# Patient Record
Sex: Female | Born: 1970
Health system: Southern US, Community
[De-identification: ages and names within clinical notes are randomized; demographics above are authoritative.]

## PROBLEM LIST (undated history)

## (undated) DIAGNOSIS — R21 Rash and other nonspecific skin eruption: Secondary | ICD-10-CM

## (undated) DIAGNOSIS — I10 Essential (primary) hypertension: Secondary | ICD-10-CM

## (undated) DIAGNOSIS — R7303 Prediabetes: Secondary | ICD-10-CM

## (undated) DIAGNOSIS — Z20828 Contact with and (suspected) exposure to other viral communicable diseases: Secondary | ICD-10-CM

## (undated) DIAGNOSIS — F419 Anxiety disorder, unspecified: Secondary | ICD-10-CM

## (undated) DIAGNOSIS — E559 Vitamin D deficiency, unspecified: Secondary | ICD-10-CM

## (undated) DIAGNOSIS — E785 Hyperlipidemia, unspecified: Secondary | ICD-10-CM

## (undated) DIAGNOSIS — E538 Deficiency of other specified B group vitamins: Secondary | ICD-10-CM

## (undated) DIAGNOSIS — R011 Cardiac murmur, unspecified: Secondary | ICD-10-CM

## (undated) DIAGNOSIS — I493 Ventricular premature depolarization: Secondary | ICD-10-CM

## (undated) DIAGNOSIS — D509 Iron deficiency anemia, unspecified: Secondary | ICD-10-CM

## (undated) HISTORY — PX: WISDOM TOOTH EXTRACTION: SHX21

## (undated) HISTORY — DX: Ventricular premature depolarization: I49.3

## (undated) HISTORY — DX: Anxiety disorder, unspecified: F41.9

## (undated) HISTORY — DX: Gilbert syndrome: E80.4

## (undated) HISTORY — PX: TUBAL LIGATION: SHX77

## (undated) HISTORY — PX: TOTAL ABDOMINAL HYSTERECTOMY: SHX209

## (undated) HISTORY — DX: Contact with and (suspected) exposure to other viral communicable diseases: Z20.828

## (undated) HISTORY — DX: Hyperlipidemia, unspecified: E78.5

---

## 1998-08-02 ENCOUNTER — Emergency Department (HOSPITAL_COMMUNITY): Admission: EM | Admit: 1998-08-02 | Discharge: 1998-08-02 | Payer: Self-pay | Admitting: Emergency Medicine

## 1999-01-17 ENCOUNTER — Other Ambulatory Visit: Admission: RE | Admit: 1999-01-17 | Discharge: 1999-01-17 | Payer: Self-pay | Admitting: Obstetrics and Gynecology

## 1999-02-25 ENCOUNTER — Ambulatory Visit (HOSPITAL_COMMUNITY): Admission: RE | Admit: 1999-02-25 | Discharge: 1999-02-25 | Payer: Self-pay | Admitting: Obstetrics and Gynecology

## 1999-02-25 ENCOUNTER — Encounter: Payer: Self-pay | Admitting: Obstetrics and Gynecology

## 1999-04-08 ENCOUNTER — Inpatient Hospital Stay (HOSPITAL_COMMUNITY): Admission: AD | Admit: 1999-04-08 | Discharge: 1999-04-15 | Payer: Self-pay | Admitting: Obstetrics and Gynecology

## 1999-04-16 ENCOUNTER — Encounter (HOSPITAL_COMMUNITY): Admission: RE | Admit: 1999-04-16 | Discharge: 1999-07-15 | Payer: Self-pay | Admitting: Obstetrics and Gynecology

## 2000-02-05 ENCOUNTER — Other Ambulatory Visit: Admission: RE | Admit: 2000-02-05 | Discharge: 2000-02-05 | Payer: Self-pay | Admitting: Obstetrics and Gynecology

## 2001-03-09 ENCOUNTER — Other Ambulatory Visit: Admission: RE | Admit: 2001-03-09 | Discharge: 2001-03-09 | Payer: Self-pay | Admitting: Obstetrics and Gynecology

## 2002-04-06 ENCOUNTER — Other Ambulatory Visit: Admission: RE | Admit: 2002-04-06 | Discharge: 2002-04-06 | Payer: Self-pay | Admitting: Obstetrics and Gynecology

## 2003-05-03 ENCOUNTER — Encounter: Payer: Self-pay | Admitting: Emergency Medicine

## 2003-05-03 ENCOUNTER — Emergency Department (HOSPITAL_COMMUNITY): Admission: EM | Admit: 2003-05-03 | Discharge: 2003-05-03 | Payer: Self-pay | Admitting: Emergency Medicine

## 2003-05-04 ENCOUNTER — Encounter: Admission: RE | Admit: 2003-05-04 | Discharge: 2003-05-04 | Payer: Self-pay | Admitting: Internal Medicine

## 2003-05-04 ENCOUNTER — Encounter: Payer: Self-pay | Admitting: Internal Medicine

## 2003-05-08 ENCOUNTER — Inpatient Hospital Stay (HOSPITAL_COMMUNITY): Admission: EM | Admit: 2003-05-08 | Discharge: 2003-05-10 | Payer: Self-pay | Admitting: Emergency Medicine

## 2003-05-09 ENCOUNTER — Encounter: Payer: Self-pay | Admitting: Cardiology

## 2004-02-19 ENCOUNTER — Other Ambulatory Visit: Admission: RE | Admit: 2004-02-19 | Discharge: 2004-02-19 | Payer: Self-pay | Admitting: Obstetrics and Gynecology

## 2004-03-21 ENCOUNTER — Ambulatory Visit (HOSPITAL_COMMUNITY): Admission: RE | Admit: 2004-03-21 | Discharge: 2004-03-21 | Payer: Self-pay | Admitting: Obstetrics and Gynecology

## 2004-07-04 ENCOUNTER — Inpatient Hospital Stay (HOSPITAL_COMMUNITY): Admission: AD | Admit: 2004-07-04 | Discharge: 2004-07-04 | Payer: Self-pay | Admitting: Obstetrics and Gynecology

## 2004-07-05 ENCOUNTER — Inpatient Hospital Stay (HOSPITAL_COMMUNITY): Admission: AD | Admit: 2004-07-05 | Discharge: 2004-07-05 | Payer: Self-pay | Admitting: Obstetrics and Gynecology

## 2004-08-26 ENCOUNTER — Inpatient Hospital Stay (HOSPITAL_COMMUNITY): Admission: AD | Admit: 2004-08-26 | Discharge: 2004-08-26 | Payer: Self-pay | Admitting: Obstetrics and Gynecology

## 2004-08-29 ENCOUNTER — Encounter (INDEPENDENT_AMBULATORY_CARE_PROVIDER_SITE_OTHER): Payer: Self-pay | Admitting: Specialist

## 2004-08-29 ENCOUNTER — Inpatient Hospital Stay (HOSPITAL_COMMUNITY): Admission: AD | Admit: 2004-08-29 | Discharge: 2004-09-02 | Payer: Self-pay | Admitting: Obstetrics and Gynecology

## 2004-09-03 ENCOUNTER — Encounter: Admission: RE | Admit: 2004-09-03 | Discharge: 2004-10-03 | Payer: Self-pay | Admitting: Obstetrics and Gynecology

## 2004-11-03 ENCOUNTER — Encounter: Admission: RE | Admit: 2004-11-03 | Discharge: 2004-12-03 | Payer: Self-pay | Admitting: Obstetrics and Gynecology

## 2006-04-23 ENCOUNTER — Ambulatory Visit: Payer: Self-pay | Admitting: Internal Medicine

## 2006-04-26 ENCOUNTER — Ambulatory Visit: Payer: Self-pay | Admitting: Internal Medicine

## 2006-05-26 ENCOUNTER — Ambulatory Visit: Payer: Self-pay | Admitting: Internal Medicine

## 2006-10-04 ENCOUNTER — Emergency Department (HOSPITAL_COMMUNITY): Admission: EM | Admit: 2006-10-04 | Discharge: 2006-10-04 | Payer: Self-pay | Admitting: Emergency Medicine

## 2006-11-05 ENCOUNTER — Emergency Department (HOSPITAL_COMMUNITY): Admission: EM | Admit: 2006-11-05 | Discharge: 2006-11-05 | Payer: Self-pay | Admitting: Emergency Medicine

## 2007-06-22 ENCOUNTER — Ambulatory Visit: Payer: Self-pay | Admitting: Internal Medicine

## 2007-06-22 DIAGNOSIS — I493 Ventricular premature depolarization: Secondary | ICD-10-CM | POA: Insufficient documentation

## 2007-06-22 DIAGNOSIS — F411 Generalized anxiety disorder: Secondary | ICD-10-CM | POA: Insufficient documentation

## 2007-06-29 ENCOUNTER — Telehealth (INDEPENDENT_AMBULATORY_CARE_PROVIDER_SITE_OTHER): Payer: Self-pay | Admitting: *Deleted

## 2008-01-25 ENCOUNTER — Ambulatory Visit: Payer: Self-pay | Admitting: Internal Medicine

## 2008-01-25 DIAGNOSIS — R002 Palpitations: Secondary | ICD-10-CM | POA: Insufficient documentation

## 2008-05-25 ENCOUNTER — Ambulatory Visit: Payer: Self-pay | Admitting: Internal Medicine

## 2008-06-15 ENCOUNTER — Ambulatory Visit: Payer: Self-pay | Admitting: Licensed Clinical Social Worker

## 2008-06-20 ENCOUNTER — Ambulatory Visit: Payer: Self-pay | Admitting: Licensed Clinical Social Worker

## 2008-07-02 ENCOUNTER — Ambulatory Visit: Payer: Self-pay | Admitting: Licensed Clinical Social Worker

## 2008-07-09 ENCOUNTER — Ambulatory Visit: Payer: Self-pay | Admitting: Licensed Clinical Social Worker

## 2008-07-13 ENCOUNTER — Ambulatory Visit: Payer: Self-pay | Admitting: Internal Medicine

## 2008-07-13 DIAGNOSIS — I10 Essential (primary) hypertension: Secondary | ICD-10-CM | POA: Insufficient documentation

## 2008-07-16 ENCOUNTER — Ambulatory Visit: Payer: Self-pay | Admitting: Licensed Clinical Social Worker

## 2008-07-17 ENCOUNTER — Telehealth (INDEPENDENT_AMBULATORY_CARE_PROVIDER_SITE_OTHER): Payer: Self-pay | Admitting: *Deleted

## 2008-07-23 ENCOUNTER — Telehealth (INDEPENDENT_AMBULATORY_CARE_PROVIDER_SITE_OTHER): Payer: Self-pay | Admitting: *Deleted

## 2008-08-01 ENCOUNTER — Ambulatory Visit: Payer: Self-pay | Admitting: Licensed Clinical Social Worker

## 2008-09-28 ENCOUNTER — Telehealth (INDEPENDENT_AMBULATORY_CARE_PROVIDER_SITE_OTHER): Payer: Self-pay | Admitting: *Deleted

## 2008-11-14 ENCOUNTER — Ambulatory Visit: Payer: Self-pay | Admitting: Internal Medicine

## 2008-11-14 DIAGNOSIS — E785 Hyperlipidemia, unspecified: Secondary | ICD-10-CM | POA: Insufficient documentation

## 2008-11-23 ENCOUNTER — Encounter (INDEPENDENT_AMBULATORY_CARE_PROVIDER_SITE_OTHER): Payer: Self-pay | Admitting: *Deleted

## 2009-03-21 ENCOUNTER — Telehealth (INDEPENDENT_AMBULATORY_CARE_PROVIDER_SITE_OTHER): Payer: Self-pay | Admitting: *Deleted

## 2009-12-19 ENCOUNTER — Ambulatory Visit: Payer: Self-pay | Admitting: Internal Medicine

## 2009-12-19 DIAGNOSIS — H019 Unspecified inflammation of eyelid: Secondary | ICD-10-CM | POA: Insufficient documentation

## 2009-12-23 ENCOUNTER — Encounter (INDEPENDENT_AMBULATORY_CARE_PROVIDER_SITE_OTHER): Payer: Self-pay | Admitting: *Deleted

## 2010-03-06 ENCOUNTER — Emergency Department (HOSPITAL_COMMUNITY): Admission: EM | Admit: 2010-03-06 | Discharge: 2010-03-06 | Payer: Self-pay | Admitting: Emergency Medicine

## 2011-01-18 LAB — CONVERTED CEMR LAB
ALT: 17 units/L (ref 0–35)
ALT: 30 units/L (ref 0–35)
AST: 23 units/L (ref 0–37)
AST: 25 units/L (ref 0–37)
Albumin: 3.9 g/dL (ref 3.5–5.2)
Albumin: 4.2 g/dL (ref 3.5–5.2)
Alkaline Phosphatase: 52 units/L (ref 39–117)
Alkaline Phosphatase: 66 units/L (ref 39–117)
BUN: 10 mg/dL (ref 6–23)
BUN: 7 mg/dL (ref 6–23)
BUN: 8 mg/dL (ref 6–23)
Basophils Absolute: 0 10*3/uL (ref 0.0–0.1)
Basophils Absolute: 0 10*3/uL (ref 0.0–0.1)
Basophils Absolute: 0 10*3/uL (ref 0.0–0.1)
Basophils Absolute: 0 10*3/uL (ref 0.0–0.1)
Basophils Relative: 0 % (ref 0.0–1.0)
Basophils Relative: 0.2 % (ref 0.0–1.0)
Basophils Relative: 0.5 % (ref 0.0–3.0)
Basophils Relative: 0.8 % (ref 0.0–3.0)
Bilirubin Urine: NEGATIVE
Bilirubin, Direct: 0.1 mg/dL (ref 0.0–0.3)
Bilirubin, Direct: 0.2 mg/dL (ref 0.0–0.3)
Blood in Urine, dipstick: NEGATIVE
CO2: 27 meq/L (ref 19–32)
CO2: 28 meq/L (ref 19–32)
CO2: 29 meq/L (ref 19–32)
Calcium: 9 mg/dL (ref 8.4–10.5)
Calcium: 9.1 mg/dL (ref 8.4–10.5)
Calcium: 9.1 mg/dL (ref 8.4–10.5)
Chloride: 104 meq/L (ref 96–112)
Chloride: 105 meq/L (ref 96–112)
Chloride: 106 meq/L (ref 96–112)
Cholesterol, target level: 200 mg/dL
Cholesterol: 230 mg/dL (ref 0–200)
Cholesterol: 236 mg/dL (ref 0–200)
Cholesterol: 246 mg/dL — ABNORMAL HIGH (ref 0–200)
Creatinine, Ser: 0.7 mg/dL (ref 0.4–1.2)
Creatinine, Ser: 0.7 mg/dL (ref 0.4–1.2)
Creatinine, Ser: 0.8 mg/dL (ref 0.4–1.2)
Creatinine, Ser: 0.8 mg/dL (ref 0.4–1.2)
Direct LDL: 113.9 mg/dL
Direct LDL: 150.2 mg/dL
Direct LDL: 152.2 mg/dL
Eosinophils Absolute: 0.1 10*3/uL (ref 0.0–0.6)
Eosinophils Absolute: 0.1 10*3/uL (ref 0.0–0.7)
Eosinophils Absolute: 0.1 10*3/uL (ref 0.0–0.7)
Eosinophils Absolute: 0.2 10*3/uL (ref 0.0–0.6)
Eosinophils Relative: 1.1 % (ref 0.0–5.0)
Eosinophils Relative: 1.8 % (ref 0.0–5.0)
Eosinophils Relative: 2.2 % (ref 0.0–5.0)
Eosinophils Relative: 6.8 % — ABNORMAL HIGH (ref 0.0–5.0)
Free T4: 0.7 ng/dL (ref 0.6–1.6)
GFR calc Af Amer: 104 mL/min
GFR calc Af Amer: 122 mL/min
GFR calc non Af Amer: 101 mL/min
GFR calc non Af Amer: 103.19 mL/min (ref >60)
GFR calc non Af Amer: 86 mL/min
Glucose, Bld: 74 mg/dL (ref 70–99)
Glucose, Bld: 77 mg/dL (ref 70–99)
Glucose, Bld: 86 mg/dL (ref 70–99)
Glucose, Urine, Semiquant: NEGATIVE
HCT: 34 % — ABNORMAL LOW (ref 36.0–46.0)
HCT: 38.9 % (ref 36.0–46.0)
HCT: 40.9 % (ref 36.0–46.0)
HCT: 41 % (ref 36.0–46.0)
HDL goal, serum: 40 mg/dL
HDL: 75.2 mg/dL (ref >39.0)
HDL: 80 mg/dL (ref >39.00)
HDL: 91.8 mg/dL (ref >39.0)
Hemoglobin: 11 g/dL
Hemoglobin: 11.2 g/dL — ABNORMAL LOW (ref 12.0–15.0)
Hemoglobin: 13 g/dL (ref 12.0–15.0)
Hemoglobin: 13.6 g/dL (ref 12.0–15.0)
Hemoglobin: 13.9 g/dL (ref 12.0–15.0)
Iron: 74 ug/dL (ref 42–145)
Ketones, urine, test strip: NEGATIVE
LDL Goal: 160 mg/dL
Lymphocytes Relative: 25.8 % (ref 12.0–46.0)
Lymphocytes Relative: 28.8 % (ref 12.0–46.0)
Lymphocytes Relative: 28.9 % (ref 12.0–46.0)
Lymphocytes Relative: 47.4 % — ABNORMAL HIGH (ref 12.0–46.0)
Lymphs Abs: 1.4 10*3/uL (ref 0.7–4.0)
MCHC: 32.8 g/dL (ref 30.0–36.0)
MCHC: 33.3 g/dL (ref 30.0–36.0)
MCHC: 33.4 g/dL (ref 30.0–36.0)
MCHC: 33.8 g/dL (ref 30.0–36.0)
MCV: 85.6 fL (ref 78.0–100.0)
MCV: 87.9 fL (ref 78.0–100.0)
MCV: 88 fL (ref 78.0–100.0)
MCV: 90.2 fL (ref 78.0–100.0)
Monocytes Absolute: 0.3 10*3/uL (ref 0.2–0.7)
Monocytes Absolute: 0.4 10*3/uL (ref 0.1–1.0)
Monocytes Absolute: 0.4 10*3/uL (ref 0.1–1.0)
Monocytes Absolute: 0.5 10*3/uL (ref 0.2–0.7)
Monocytes Relative: 16.9 % — ABNORMAL HIGH (ref 3.0–11.0)
Monocytes Relative: 6.3 % (ref 3.0–11.0)
Monocytes Relative: 6.9 % (ref 3.0–12.0)
Monocytes Relative: 7.9 % (ref 3.0–12.0)
Neutro Abs: 0.9 10*3/uL — ABNORMAL LOW (ref 1.4–7.7)
Neutro Abs: 3.1 10*3/uL (ref 1.4–7.7)
Neutro Abs: 3.7 10*3/uL (ref 1.4–7.7)
Neutro Abs: 3.7 10*3/uL (ref 1.4–7.7)
Neutrophils Relative %: 28.9 % — ABNORMAL LOW (ref 43.0–77.0)
Neutrophils Relative %: 61.2 % (ref 43.0–77.0)
Neutrophils Relative %: 63.6 % (ref 43.0–77.0)
Neutrophils Relative %: 64 % (ref 43.0–77.0)
Nitrite: NEGATIVE
Platelets: 232 10*3/uL (ref 150.0–400.0)
Platelets: 237 10*3/uL (ref 150–400)
Platelets: 244 10*3/uL (ref 150–400)
Platelets: 306 10*3/uL (ref 150–400)
Potassium: 4 meq/L (ref 3.5–5.1)
Potassium: 4 meq/L (ref 3.5–5.1)
Potassium: 4.4 meq/L (ref 3.5–5.1)
Potassium: 4.4 meq/L (ref 3.5–5.1)
Protein, U semiquant: NEGATIVE
RBC: 3.86 M/uL — ABNORMAL LOW (ref 3.87–5.11)
RBC: 4.31 M/uL (ref 3.87–5.11)
RBC: 4.66 M/uL (ref 3.87–5.11)
RBC: 4.78 M/uL (ref 3.87–5.11)
RDW: 13.2 % (ref 11.5–14.6)
RDW: 13.2 % (ref 11.5–14.6)
RDW: 13.4 % (ref 11.5–14.6)
RDW: 13.5 % (ref 11.5–14.6)
Saturation Ratios: 20.4 % (ref 20.0–50.0)
Sodium: 139 meq/L (ref 135–145)
Sodium: 140 meq/L (ref 135–145)
Sodium: 141 meq/L (ref 135–145)
Specific Gravity, Urine: 1.01
TSH: 1.09 microintl units/mL (ref 0.35–5.50)
TSH: 1.17 microintl units/mL (ref 0.35–5.50)
TSH: 1.31 microintl units/mL (ref 0.35–5.50)
TSH: 1.41 microintl units/mL (ref 0.35–5.50)
Total Bilirubin: 1.3 mg/dL — ABNORMAL HIGH (ref 0.3–1.2)
Total Bilirubin: 1.6 mg/dL — ABNORMAL HIGH (ref 0.3–1.2)
Total CHOL/HDL Ratio: 2.5
Total CHOL/HDL Ratio: 3
Total CHOL/HDL Ratio: 3.1
Total Protein: 7.2 g/dL (ref 6.0–8.3)
Total Protein: 7.8 g/dL (ref 6.0–8.3)
Transferrin: 259.2 mg/dL (ref >=212.0)
Triglycerides: 43 mg/dL (ref 0–149)
Triglycerides: 51 mg/dL (ref 0–149)
Triglycerides: 58 mg/dL (ref 0.0–149.0)
Urobilinogen, UA: NEGATIVE
VLDL: 10 mg/dL (ref 0–40)
VLDL: 11.6 mg/dL (ref 0.0–40.0)
VLDL: 9 mg/dL (ref 0–40)
WBC Urine, dipstick: NEGATIVE
WBC: 3.1 10*3/uL — ABNORMAL LOW (ref 4.5–10.5)
WBC: 4.9 10*3/uL (ref 4.5–10.5)
WBC: 5.6 10*3/uL (ref 4.5–10.5)
WBC: 5.9 10*3/uL (ref 4.5–10.5)
pH: 6.5

## 2011-01-22 NOTE — Consult Note (Signed)
Summary: [name]   Imported By: Lanelle Bal 12/25/2009 11:52:00  _____________________________________________________________________  External Attachment:    Type:   Image     Comment:   External Document

## 2011-01-22 NOTE — Letter (Signed)
Summary: Results Follow up Letter   at [place]  [address] [place], [place] [address]   Phone: [phone]  Fax: [phone]    12/23/2009 MRN: [id number]  [facility] [address] [place], [place]  [address]  Dear [name],  The following are the results of your recent test(s):  Test         Result    Pap Smear:        Normal _____  Not Normal _____ Comments: ______________________________________________________ Cholesterol: LDL(Bad cholesterol):         Your goal is less than:         HDL (Good cholesterol):       Your goal is more than: Comments:  ______________________________________________________ Mammogram:        Normal _____  Not Normal _____ Comments:  ___________________________________________________________________ Hemoccult:        Normal _____  Not normal _______ Comments:    _____________________________________________________________________ Other Tests: Please see attached labs done on 12/19/2009, call to schedule appointment to recheck labs in 4 months [phone] EXT(0)    We routinely do not discuss normal results over the telephone.  If you desire a copy of the results, or you have any questions about this information we can discuss them at your next office visit.   Sincerely,

## 2011-02-09 ENCOUNTER — Telehealth (INDEPENDENT_AMBULATORY_CARE_PROVIDER_SITE_OTHER): Payer: Self-pay | Admitting: *Deleted

## 2011-02-17 NOTE — Progress Notes (Signed)
Summary: refill  Phone Note Refill Request Message from:  Fax from Pharmacy  Refills Requested: Medication #1:  FLUOXETINE HCL 20 MG  TABS once daily as directed Gweneth Dimitri - fax [phone]  Initial call taken by: Okey Regal [place],  February 09, 2011 3:38 PM    Prescriptions: FLUOXETINE HCL 20 MG  TABS (FLUOXETINE HCL) once daily as directed  #90 x 0   Entered by:   Shonna Chock CMA   Authorized by:   Marga Melnick MD   Signed by:   Shonna Chock CMA on 02/09/2011   Method used:   Electronically to        [facility] (retail)       [address]       [place], [place]  [address]       Ph: [phone]       Fax: [phone]   RxID:   [id number]

## 2011-03-04 ENCOUNTER — Encounter: Payer: Self-pay | Admitting: Internal Medicine

## 2011-03-16 LAB — POCT I-STAT, CHEM 8
BUN: 7 mg/dL (ref 6–23)
Creatinine, Ser: 0.8 mg/dL (ref 0.4–1.2)
Glucose, Bld: 87 mg/dL (ref 70–99)
Hemoglobin: 12.9 g/dL (ref 12.0–15.0)
Potassium: 4.1 mEq/L (ref 3.5–5.1)

## 2011-03-16 LAB — URINALYSIS, ROUTINE W REFLEX MICROSCOPIC
Bilirubin Urine: NEGATIVE
Glucose, UA: NEGATIVE mg/dL
Hgb urine dipstick: NEGATIVE
Urobilinogen, UA: 0.2 mg/dL (ref 0.0–1.0)
pH: 7.5 (ref 5.0–8.0)

## 2011-04-07 ENCOUNTER — Other Ambulatory Visit: Payer: Self-pay

## 2011-04-07 MED ORDER — FLUOXETINE HCL 10 MG PO TABS: 10.0000 mg | ORAL_TABLET | ORAL | Status: DC
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
## 2011-05-08 NOTE — Op Note (Signed)
[name], [name]                           ACCOUNT NO.:  [id number]   MEDICAL RECORD NO.:  [id number]                   PATIENT TYPE:  AMB   LOCATION:  SDC                                  [facility]  [facility]   PHYSICIAN:  Maxie Better, M.D.            DATE OF BIRTH:  1971/02/01   DATE OF PROCEDURE:  03/21/2004  DATE OF DISCHARGE:                                 OPERATIVE REPORT   PREOPERATIVE DIAGNOSES:  1. Cervical incompetence.  2. Intrauterine gestation at 13+ weeks.   PROCEDURE:  McDonald cerclage placement.   POSTOPERATIVE DIAGNOSES:  1. Cervical incompetence.  2. Intrauterine gestation at 13+ weeks.   ANESTHESIA:  Spinal.   SURGEON:  Maxie Better, M.D.   INDICATIONS:  This is a 40 year old gravida 2, para 0-1-0-1, female at 13+  weeks' gestation with a previous preterm delivery at 23 weeks, who is now  admitted for cerclage placement.  Her dating has been confirmed by  ultrasound.  Risk and benefit of the procedure has been explained to the  patient and her husband, and consent was signed.  The patient was  transferred to the operating room.   PROCEDURE:  Under adequate spinal anesthesia, the patient was placed in the  dorsal lithotomy position.  She was sterilely prepped and draped in the  usual fashion, and the bladder was catheterized for a small amount of urine.  Examination under anesthesia revealed anteverted 13-14 week size uterus.  Cervix was about 3 cm long, fingertip external os.  A weighted speculum was  placed in the vagina and a Sims retractor was used anteriorly.  The vagina  was prepped with sterile water and a #1 Prolene was utilized to perform a  McDonald cerclage placement in the usual fashion.  The suture started at 12  o'clock.  The procedure was done without incident, at which time the cervix  still remained long and otherwise essentially closed.  All instruments were  then removed from the vagina.   SPECIMENS:  None.   ESTIMATED  BLOOD LOSS:  Minimal.   COMPLICATIONS:  None.   The patient tolerated the procedure well, was transferred to the recovery  room in stable condition.                                               Maxie Better, M.D.   [facility]  D:  03/21/2004  T:  03/21/2004  Job:  [id number]

## 2011-05-08 NOTE — Op Note (Signed)
[name], [name]                           ACCOUNT NO.:  [id number]   MEDICAL RECORD NO.:  [id number]                   PATIENT TYPE:  INP   LOCATION:  NA                                   [facility]   PHYSICIAN:  Maxie Better, M.D.            DATE OF BIRTH:  February 19, 1971   DATE OF PROCEDURE:  08/29/2004  DATE OF DISCHARGE:                                 OPERATIVE REPORT   PREOPERATIVE DIAGNOSES:  1.  Antepartum hemorrhage.  2.  Placental abruption.  3.  Previous cesarean section.  4.  Intrauterine gestation at 36 plus weeks.  5.  Desires sterilization.   POSTOPERATIVE DIAGNOSES:  1.  Antepartum hemorrhage.  2.  Placental abruption.  3.  Previous cesarean section.  4.  Intrauterine gestation at 36 plus weeks.  5.  Desires sterilization.   PROCEDURE:  Emergency repeat cesarean section via Sharl Ma hysterotomy, modified  Pomeroy tubal ligation.   ANESTHESIA:  Spinal.   SURGEON:  Maxie Better, M.D.   ASSISTANT:  Gerri Spore B. Earlene Plater, M.D.   INDICATIONS FOR PROCEDURE:  A 40 year old, gravida 2, para 0-1-0-1, married  black female with a previous cesarean section secondary to preterm premature  rupture of membranes, placental abruption of 23 weeks who presented at 36  weeks at our office and subsequently had perfuse uncontrollable bright red  vessel bleeding per vagina transferred via ambulance from the office  directly into the operating room for an emergency repeat cesarean section.  The patient and husband confirmed they still desire for sterilization. The  patient had been scheduled for repeat cesarean section and tubal ligation on  September 28.  Her pregnancy had been complicated by cerclage placement at  13-14 weeks due to previous cervical incompetence. The cerclage had been  removed three days ago at maternity admissions.  She received alpha-  hydroxyprogesterone injections until 34 weeks. She had betamethasone for  fetal lung maturity at 28 weeks in  preparation for possibly preterm  delivery.  Her group B strep culture was positive. She had intact membranes  and had noted some decreased fetal movement just prior to presenting to the  office with complaints of vaginal bleeding.   DESCRIPTION OF PROCEDURE:  Under adequate spinal anesthesia, the patient was  quickly sterilely prepped and draped in the usual fashion, indwelling Foley  catheter was sterilely placed.  Marcaine 0.25% was injected along the  previous scar, Pfannenstiel skin incision was made, carried down to the  rectus fascia, rectus fascia was incised transversely. The rectus fascia was  then sharply dissected off the rectus muscle. The rectus muscle had some  diastasis and was split in the midline. Care was taken not to injure the  bladder which appeared to be slightly tucked anteriorly.  The peritoneal  cavity was opened, no evidence of a Couvelaire uterus. The lower uterine  segment was well developed, bladder flap was created and the bladder  displaced inferiorly.  A curvilinear low transverse incision was then made  and extended bluntly with subsequent large amount of clotted bright red  blood removed. The amniotic sac was then seen and artifical rupture of  membranes was performed. Clear fluid was noted. Subsequent delivery of a  live female infant was then accomplished, baby was bulb suctioned in the  abdomen.  The cord was clamped, cut, the baby was transferred to the waiting  pediatricians who assigned Apgar's of 4 and 9 at 1 and 5 minutes.  Cord pH  was obtained which was 7.21 subsequently, weight of the baby was 5 pounds 13  ounces. The placenta was manually removed and sent to pathology. The uterine  cavity was cleaned of debris. No uterine extension was noted.  On the inside  aspect of the lower portion of the opened uterine cavity was a serpentine  type bleeding vessel. This was individually hemostased with a #0 Monocryl.  The uterine incision was then closed  with #0 Monocryl x2, first layer  running locked stitch, second layer was imbricated with good hemostasis  noted. Attention was then turned to the tubes. Both tubes and ovaries are  normal. The lid portion of the left fallopian tube was grasped and placed up  in the field. The underlying mesosalpinx was then opened, proximal and  distal portion of that tube was then tied with #0 plain suture proximally x2  and distally x2 with the intervening segment of tube then removed and the  stump cauterized. The same procedure was performed contralaterally with a  portion of that tube removed. Reinspection of the uterine incision showed  good hemostasis, small bleeding along the peritoneal edges were cauterized.  The abdomen was then copiously irrigated, suctioned of debris with good  hemostasis then noted.  The parietoperitoneums were not closed, the rectus  fascia was closed with #0 Vicryl x2.  A portion of the keloid scar was  removed, however, for adequate closure all of it was not removed.  The  subcutaneous area was irrigated, suctioned, small bleeders cauterized and  skin approximated using Ethicon staples. The specimen was placenta and  portion of right and left fallopian tube all sent to pathology.  Estimated  blood loss was 900 mL intraoperatively.  Intraoperative fluid was 3700 mL  crystalloid and urine output was 200 mL clear yellow urine. Complications  none.  Sponge and instrument counts x2 was correct. The patient tolerated  the procedure well and was transferred to the recovery room in stable  condition.                                               Maxie Better, M.D.    [facility]  D:  08/29/2004  T:  08/30/2004  Job:  [id number]
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
## 2011-05-08 NOTE — Discharge Summary (Signed)
[name], [name]                 ACCOUNT NO.:  [id number]   MEDICAL RECORD NO.:  [id number]          PATIENT TYPE:  INP   LOCATION:  9110                          [facility]  [facility]   PHYSICIAN:  Maxie Better, M.D.DATE OF BIRTH:  [birth year]   DATE OF ADMISSION:  08/29/2004  DATE OF DISCHARGE:  09/02/2004                                 DISCHARGE SUMMARY   ADMISSION DIAGNOSES:  1.  Antepartum hemorrhage.  2.  Placental abruption.  3.  Previous cesarean section.  4.  Desires sterilization.  5.  Intrauterine gestation at 36 plus weeks.   ADMISSION DIAGNOSES:  1.  Placental abruption.  2.  Intrauterine gestation at 36 plus weeks, delivered.  3.  Previous cesarean section.  4.  Desires sterilization.  5.  Antepartum hemorrhage.  6.  Iron deficiency anemia.   PROCEDURE:  Emergency repeat cesarean section, modified P[place]oy tubal  ligation.   HOSPITAL COURSE:  The patient was admitted to [facility] as a 40-year-  old gravida para 0-1-0-1 female at 73 plus weeks gestation with profuse  antepartum bleeding who was taken to the operating room for emergency repeat  cesarean section secondary to presumed placental abruption.  The patient's  prenatal course had been complicated by previous history of a preterm  delivery at 23 weeks with the diagnosis of cervical incompetence, for which  the patient subsequently had a McDonald cerclage placement which had been  removed three days prior to admission.  The patient also had received  __________  hydroxyprogesterone until 34 weeks.  The patient was transferred  from the office via ambulance into the operating room and underwent an  emergency repeat cesarean section.  The procedure resulted in delivery of a  live female infant weighing 5 pounds 13 ounces.  Apgars were 4 and 9.  Cord pH  was 7.21.  Placenta was anterior, with evidence of abruption.  Normal tubes  and ovaries.  The patient still expressed a desire for permanent  sterilization, and a tubal ligation was performed.  The patient had an  uncomplicated postoperative course.  Her CBC on postop day one showed a  gallbladder of 8.  Her preop hemoglobin was 10.6.  Her white count was 18.4.  However, the patient did not spike a temperature during her hospitalization.  By postop day four, the patient was tolerating a regular diet.  She had had  a bowel movement.  Her incision showed no erythema, induration, or exudate.  The patient was complaining of some burning on the right aspect of the  incision, but otherwise fine.  She was __________ to be discharged home.   DISPOSITION:  Home.   CONDITION:  Stable.   DISCHARGE MEDICATIONS:  1.  Tylox, #30, one p.o. q.4 h. p.r.n. pain.  2.  Slow-Fe one p.o. daily.   FOLLOW-UP APPOINTMENT:  At [facility] in four weeks.  Staple removal in  the office.   DISCHARGE INSTRUCTIONS:  Per the postpartum booklet given to the patient.      [place]/MEDQ  D:  09/16/2004  T:  09/17/2004  Job:  [id number]

## 2011-05-08 NOTE — H&P (Signed)
[name], [name]                           ACCOUNT NO.:  [id number]   MEDICAL RECORD NO.:  [id number]                   PATIENT TYPE:  AMB   LOCATION:  SDC                                  FACILITY:  [facility]   PHYSICIAN:  Maxie Better, M.D.            DATE OF BIRTH:  [birth year]   DATE OF ADMISSION:  DATE OF DISCHARGE:                                HISTORY & PHYSICAL   PLANNED DATE OF PROCEDURE:  March 21, 2004   CHIEF COMPLAINT:  Cervical incompetence.   HISTORY OF PRESENT ILLNESS:  A 40 year old gravida 2 para 0-1-0-1 married  black female; LMP of December 17, 2003; [facility] of September 23, 2004; consistent  with an ultrasound done on February 19, 2004 at 9.[redacted] weeks gestation who is  currently 5 and three-sevenths weeks gestation being admitted for cerclage  placement secondary to cervical incompetence.  The patient's history is  notable for preterm delivery at 23 weeks after she presented with full  dilatation at 22 and five-sevenths weeks.  The complaint at that time was  vaginal discharge.  She subsequently went on to have a preterm premature  rupture of membranes and third trimester bleeding resulting in an emergent  cesarean section with resulting delivery of a 1-pound female infant.  Workup  for antiphospholipid antibody syndrome has been negative.  Prenatal care is  at [facility], obstetrician [facility].   PRENATAL LABORATORY DATA:  Blood type O positive, antibody screen is  negative.  Hemoglobin electrophoresis is normal.  RPR is nonreactive.  Rubella is immune.  Hepatitis B surface antigen is negative.  HIV test was  declined.  Pap was normal.  Antiphospholipid antibody panel was normal.  GC  and chlamydia culture was negative.   PAST MEDICAL HISTORY:  1. Allergy possibly to PENICILLIN.  2. Medicines are prenatal vitamins.  3. Medical history:  Anxiety disorder, history of chronic hypertension.  4. Surgical history:  Cesarean section in 2000.   OBSTETRICAL  HISTORY:  Cesarean section, low transverse uterine incision,  live female, breech presentation and third trimester vaginal bleeding, 23  weeks.  Presented at 22 and five-sevenths weeks with painless cervical  dilatation.  Subsequent preterm premature rupture of membranes, preterm  labor, and placental abruption.   FAMILY HISTORY:  Hypertension - father.   SOCIAL HISTORY:  Homemaker, married, nonsmoker.  One child with  developmental delays.   REVIEW OF SYSTEMS:  Negative except as per HPI.   PHYSICAL EXAMINATION:  GENERAL:  Well-developed, well-nourished, thin black  female in no acute distress.  VITAL SIGNS:  Blood pressure 102/68, weight 133.8 pounds, height 5 feet 10  inches.  SKIN:  Shows no lesions.  HEENT:  Anicteric sclerae, pink conjunctivae.  Oropharynx negative.  HEART:  Regular rate and rhythm without murmur.  LUNGS:  Clear to auscultation.  BREASTS:  Soft, nontender, no palpable mass.  NECK:  Supple, no palpable thyroid.  ABDOMEN:  Soft,  nontender.  Palpable mass about three fingerbreadths above  the symphysis pubis.  PELVIC:  Vulva show no lesions.  Vagina had no discharge.  Cervix was  closed.  Uterus anteverted, 13 weeks size.  Adnexa not appreciable secondary  to increased uterine size.  EXTREMITIES:  No edema.   IMPRESSION:  1. Cervical incompetence.  2. Intrauterine gestation at 13+ weeks.  3. Previous cesarean section.   PLAN:  1. Admission.  2. McDonald cerclage placement.  3. Routine admission labs.   Risks and benefits of the procedure have been explained to the patient  including infection, bleeding, scarring cervix.  All questions answered.                                               Maxie Better, M.D.    [place]/MEDQ  D:  03/14/2004  T:  03/14/2004  Job:  [id number]

## 2011-05-30 ENCOUNTER — Encounter: Payer: Self-pay | Admitting: Internal Medicine

## 2011-06-02 ENCOUNTER — Ambulatory Visit (INDEPENDENT_AMBULATORY_CARE_PROVIDER_SITE_OTHER): Payer: 59 | Admitting: Internal Medicine

## 2011-06-02 ENCOUNTER — Encounter: Payer: Self-pay | Admitting: Internal Medicine

## 2011-06-02 VITALS — BP 114/78 | HR 72 | Temp 98.5°F | Resp 12 | Ht 68.25 in | Wt 164.2 lb

## 2011-06-02 DIAGNOSIS — J310 Chronic rhinitis: Secondary | ICD-10-CM

## 2011-06-02 DIAGNOSIS — R030 Elevated blood-pressure reading, without diagnosis of hypertension: Secondary | ICD-10-CM

## 2011-06-02 DIAGNOSIS — R55 Syncope and collapse: Secondary | ICD-10-CM | POA: Insufficient documentation

## 2011-06-02 DIAGNOSIS — E785 Hyperlipidemia, unspecified: Secondary | ICD-10-CM

## 2011-06-02 DIAGNOSIS — I340 Nonrheumatic mitral (valve) insufficiency: Secondary | ICD-10-CM

## 2011-06-02 DIAGNOSIS — Z0000 Encounter for general adult medical examination without abnormal findings: Secondary | ICD-10-CM

## 2011-06-02 DIAGNOSIS — I059 Rheumatic mitral valve disease, unspecified: Secondary | ICD-10-CM

## 2011-06-02 LAB — BASIC METABOLIC PANEL
BUN: 9 mg/dL (ref 6–23)
Creatinine, Ser: 0.9 mg/dL (ref 0.4–1.2)
GFR: 87.15 mL/min (ref >60.00)

## 2011-06-02 LAB — CBC WITH DIFFERENTIAL/PLATELET
Basophils Absolute: 0 10*3/uL (ref 0.0–0.1)
HCT: 38.9 % (ref 36.0–46.0)
Hemoglobin: 13 g/dL (ref 12.0–15.0)
Lymphs Abs: 1 10*3/uL (ref 0.7–4.0)
MCHC: 33.5 g/dL (ref 30.0–36.0)
Monocytes Relative: 13 % — ABNORMAL HIGH (ref 3.0–12.0)
Neutro Abs: 1.8 10*3/uL (ref 1.4–7.7)
RDW: 13.8 % (ref 11.5–14.6)

## 2011-06-02 LAB — HEPATIC FUNCTION PANEL
AST: 19 U/L (ref 0–37)
Albumin: 4.3 g/dL (ref 3.5–5.2)
Total Bilirubin: 1 mg/dL (ref 0.3–1.2)

## 2011-06-02 LAB — TSH: TSH: 1.11 u[IU]/mL (ref 0.35–5.50)

## 2011-06-02 LAB — LIPID PANEL: Cholesterol: 244 mg/dL — ABNORMAL HIGH (ref 0–200)

## 2011-06-02 MED ORDER — LORATADINE 10 MG PO TABS: 10.0000 mg | ORAL_TABLET | Freq: Every day | ORAL | Status: DC

## 2011-06-02 NOTE — Patient Instructions (Signed)
Preventive Health Care: Exercise  30-45  minutes a day, 3-4 days a week. Walking is especially valuable in preventing Osteoporosis. Eat a low-fat diet with lots of fruits and vegetables, up to 7-9 servings per day. Seatbelts can save your life. Wear them always. Health Care Power of Attorney & Living Will place you in charge of your health care  decisions. Verify these are  in place.

## 2011-06-02 NOTE — Assessment & Plan Note (Signed)
LDL goal = < 170, ideally < 130

## 2011-06-02 NOTE — Progress Notes (Signed)
Subjective:    Patient ID: [name], female    DOB: 12/26/1970, 40 y.o.   MRN: [id number]  HPI [name] is here for  A physical; she is being treated for a UTI by her Gyn.    Review of Systems Patient reports no significant  vision/ hearing  changes, adenopathy,fever, weight change,  persistant / recurrent hoarseness , swallowing issues, chest pain,recurrent palpitations,edema,persistant /recurrent cough, hemoptysis, dyspnea( rest/ exertional/paroxysmal nocturnal), gastrointestinal bleeding(melena, rectal bleeding), abdominal pain, significant heartburn,  bowel changes,GU symptoms(dysuria, hematuria,pyuria, incontinence  ), Gyn symptoms(abnormal  bleeding , pain),  syncope, focal weakness, memory loss,numbness & tingling, skin/hair /nail changes,abnormal bruising or bleeding, anxiety,or depression.  Note: rare palpitations; dysuria with UTI. Some perennial itchy eyes & sneezing with nocturnal cough. Allegra has helped.     Objective:   Physical Exam Gen.: Thin but healthy and well-nourished in appearance. Alert, appropriate and cooperative throughout exam. Head: Normocephalic without obvious abnormalities Eyes: No corneal or conjunctival inflammation noted. Pupils equal round reactive to light and accommodation. Fundal exam is benign without hemorrhages, exudate, papilledema. Extraocular motion intact. Vision grossly normal. Ears: External  ear exam reveals no significant lesions or deformities. Canals clear .TMs normal. Hearing is grossly normal bilaterally. Nose: External nasal exam reveals no deformity or inflammation. Nasal mucosa are pink and moist. No lesions or exudates noted. Mouth: Oral mucosa and oropharynx reveal no lesions or exudates. Teeth in good repair. Neck: No deformities, masses, or tenderness noted. Range of motion & . Thyroid normal. Lungs: Normal respiratory effort; chest expands symmetrically. Lungs are clear to auscultation without rales, wheezes, or increased work of  breathing. Heart: Normal rate and rhythm. Normal S1 and S2. No gallop, or rub. Subtle apical click; no  murmur. Abdomen: Bowel sounds normal; abdomen soft and nontender. No masses, organomegaly or hernias noted. Genitalia: Dr Cherly Hensen                                                                                      Musculoskeletal/extremities: No deformity or scoliosis noted of  the thoracic or lumbar spine. No clubbing, cyanosis, edema, or deformity noted. Range of motion  normal .Tone & strength  normal.Joints normal. Nail health  good. Vascular: Carotid, radial artery, dorsalis pedis and dorsalis posterior tibial pulses are full and equal. No bruits present. Neurologic: Alert and oriented x3. Deep tendon reflexes symmetrical and normal.          Skin: Intact without suspicious lesions or rashes. Lymph: No cervical, axillary, or inguinal lymphadenopathy present. Psych: Mood and affect are normal. Normally interactive                                                                                         Assessment & Plan:  #1 comprehensive physical #2 Problem List assessed & goals set #3  rhinitis with cough Plan:see Orders

## 2011-06-03 LAB — LDL CHOLESTEROL, DIRECT: Direct LDL: 153.4 mg/dL

## 2011-06-12 ENCOUNTER — Other Ambulatory Visit: Payer: Self-pay | Admitting: Internal Medicine

## 2011-06-12 DIAGNOSIS — J310 Chronic rhinitis: Secondary | ICD-10-CM

## 2011-06-12 MED ORDER — LORATADINE 10 MG PO TABS: 10.0000 mg | ORAL_TABLET | Freq: Every day | ORAL | Status: DC

## 2011-09-21 ENCOUNTER — Other Ambulatory Visit: Payer: Self-pay | Admitting: Internal Medicine

## 2012-03-30 ENCOUNTER — Other Ambulatory Visit: Payer: Self-pay

## 2012-03-30 MED ORDER — METOPROLOL TARTRATE 25 MG PO TABS: ORAL_TABLET | ORAL | Status: DC

## 2012-06-02 ENCOUNTER — Encounter: Payer: Self-pay | Admitting: Internal Medicine

## 2012-06-02 ENCOUNTER — Ambulatory Visit (INDEPENDENT_AMBULATORY_CARE_PROVIDER_SITE_OTHER): Payer: 59 | Admitting: Internal Medicine

## 2012-06-02 VITALS — BP 118/76 | HR 63 | Temp 98.2°F | Ht 69.0 in | Wt 166.0 lb

## 2012-06-02 DIAGNOSIS — L0390 Cellulitis, unspecified: Secondary | ICD-10-CM

## 2012-06-02 DIAGNOSIS — R609 Edema, unspecified: Secondary | ICD-10-CM

## 2012-06-02 DIAGNOSIS — Z0000 Encounter for general adult medical examination without abnormal findings: Secondary | ICD-10-CM

## 2012-06-02 DIAGNOSIS — L0291 Cutaneous abscess, unspecified: Secondary | ICD-10-CM

## 2012-06-02 DIAGNOSIS — Z8249 Family history of ischemic heart disease and other diseases of the circulatory system: Secondary | ICD-10-CM | POA: Insufficient documentation

## 2012-06-02 LAB — CBC WITH DIFFERENTIAL/PLATELET
Basophils Relative: 0.8 % (ref 0.0–3.0)
Eosinophils Absolute: 0.1 10*3/uL (ref 0.0–0.7)
Eosinophils Relative: 2.7 % (ref 0.0–5.0)
HCT: 42.1 % (ref 36.0–46.0)
Hemoglobin: 13.9 g/dL (ref 12.0–15.0)
Lymphs Abs: 1.4 10*3/uL (ref 0.7–4.0)
MCHC: 33 g/dL (ref 30.0–36.0)
MCV: 88.1 fl (ref 78.0–100.0)
Monocytes Absolute: 0.4 10*3/uL (ref 0.1–1.0)
Neutro Abs: 2.8 10*3/uL (ref 1.4–7.7)
RBC: 4.78 Mil/uL (ref 3.87–5.11)
WBC: 4.7 10*3/uL (ref 4.5–10.5)

## 2012-06-02 LAB — BASIC METABOLIC PANEL
CO2: 24 mEq/L (ref 19–32)
Chloride: 108 mEq/L (ref 96–112)
Creatinine, Ser: 0.8 mg/dL (ref 0.4–1.2)
Potassium: 4.2 mEq/L (ref 3.5–5.1)

## 2012-06-02 LAB — HEPATIC FUNCTION PANEL
ALT: 17 U/L (ref 0–35)
Albumin: 3.9 g/dL (ref 3.5–5.2)
Bilirubin, Direct: 0.1 mg/dL (ref 0.0–0.3)
Total Protein: 7.1 g/dL (ref 6.0–8.3)

## 2012-06-02 LAB — LIPID PANEL
Cholesterol: 224 mg/dL — ABNORMAL HIGH (ref 0–200)
HDL: 76.5 mg/dL (ref >39.00)
VLDL: 11 mg/dL (ref 0.0–40.0)

## 2012-06-02 MED ORDER — FUROSEMIDE 40 MG PO TABS: 40.0000 mg | ORAL_TABLET | Freq: Every day | ORAL | Status: DC

## 2012-06-02 MED ORDER — MUPIROCIN 2 % EX OINT: TOPICAL_OINTMENT | CUTANEOUS | Status: AC

## 2012-06-02 MED ORDER — DOXYCYCLINE HYCLATE 100 MG PO TABS: 100.0000 mg | ORAL_TABLET | Freq: Two times a day (BID) | ORAL | Status: AC

## 2012-06-02 NOTE — Patient Instructions (Addendum)
Preventive Health Care: Exercise  30-45  minutes a day, 3-4 days a week. Walking is especially valuable in preventing Osteoporosis. Eat a low-fat diet with lots of fruits and vegetables, up to 7-9 servings per day. Consume less than 30 grams of sugar per day from foods & drinks with High Fructose Corn Syrup as # 1,2,3 or #4 on label. Blood Pressure Goal  Ideally is an AVERAGE < 135/85. This AVERAGE should be calculated from @ least 5-7 BP readings taken @ different times of day on different days of week. You should not respond to isolated BP readings , but rather the AVERAGE for that week. Fill the tub half full and add cap full of bleach. Stir the  water extremely well and then bathe; cleansing areas with  lesions . Repeat this once a week x3. This is to eradicate recurrent skin infections.   Please try to go on My Chart within the next 24 hours to allow me to release the results directly to you.

## 2012-06-02 NOTE — Progress Notes (Signed)
Subjective:    Patient ID: [name], female    DOB: 1971-04-09, 41 y.o.   MRN: [id number]  HPI  [name]  is here for a physical;acute issues include  Intermittent edema  & recurrent folliculitis      Review of Systems  HYPERTENSION: Disease Monitoring  Blood pressure range: 130-140/90  Chest pain: no  Dyspnea:no   Claudication: no Medication compliance: yes  Medication Side Effects  Lightheadedness:occasionally  Urinary frequency: yes  Edema: with menses or with prolonged standing (Ex with testing @ school)  Preventitive Healthcare:  Exercise:walking 2X/ week  Diet Pattern: no plan  Salt Restriction: modified restriction  She was treated with cephalexin in April  for cellulitis of the right thigh. The cephalexin cough shortness of breath. She is now developed folliculitis in the left medial calf. There is no personal or family history of MRSA.       Objective:   Physical Exam Gen.: Healthy and well-nourished in appearance. Alert, appropriate and cooperative throughout exam. Head: Normocephalic without obvious abnormalities  Eyes: No corneal or conjunctival inflammation noted. Pupils equal round reactive to light and accommodation. Fundal exam is benign without hemorrhages, exudate, papilledema. Extraocular motion intact. Vision grossly normal. Ears: External  ear exam reveals no significant lesions or deformities. Canals clear .TMs normal. Hearing is grossly normal bilaterally. Nose: External nasal exam reveals no deformity or inflammation. Nasal mucosa are pink and moist. No lesions or exudates noted. Mouth: Oral mucosa and oropharynx reveal no lesions or exudates. Teeth in good repair. Neck: No deformities, masses, or tenderness noted. Range of motion & Thyroid normal Lungs: Normal respiratory effort; chest expands symmetrically. Lungs are clear to auscultation without rales, wheezes, or increased work of breathing. Heart: Normal rate and rhythm. Normal S1 and S2. No  gallop,  or rub. Intermittent apical click without regurgitation Abdomen: Bowel sounds normal; abdomen soft and nontender. No masses, organomegaly or hernias noted. Genitalia: Dr. Cherly Hensen                                                               Musculoskeletal/extremities: No deformity or scoliosis noted of  the thoracic or lumbar spine. No clubbing, cyanosis, edema, or deformity noted. Range of motion  normal .Tone & strength  normal.Joints normal. Nail health  good. Vascular: Carotid, radial artery, dorsalis pedis and  posterior tibial pulses are full and equal. No bruits present. Neurologic: Alert and oriented x3. Deep tendon reflexes symmetrical and normal.          Skin: 1 x 1 cm eschar with minimal associated erythema left medial calf Lymph: No cervical, axillary lymphadenopathy present. Psych: Mood and affect are normal. Normally interactive                                                                                         Assessment & Plan:  #1 comprehensive physical exam; no acute findings #2 see Problem List with Assessments &  Recommendations  #3 menstrually associated edema. Low-dose furosemide as needed will be initiated.  #4 recurrent cellulitis/folliculitis. Intolerance to cephalexin. Plan: see Orders

## 2012-06-07 ENCOUNTER — Telehealth: Payer: Self-pay

## 2012-06-07 MED ORDER — CHLORHEXIDINE GLUCONATE 4 % EX LIQD: CUTANEOUS | Status: DC

## 2012-06-07 NOTE — Telephone Encounter (Signed)
Message left on voicemail: Patient was seen last Thursday and Dr.Hopper recommended Phosophex (? Spelling), patient was told by the pharmacist that she needs a RX. Dr.Hopper please advise

## 2012-06-07 NOTE — Telephone Encounter (Signed)
Hibiclens recommended; send Rxif needed

## 2012-06-07 NOTE — Telephone Encounter (Signed)
I called and left message on voicemail with Dr.Hopper's response, rx sent to pharmacy on file. Patient instructed to call back if question or concern

## 2012-07-28 ENCOUNTER — Telehealth: Payer: Self-pay | Admitting: *Deleted

## 2012-07-28 NOTE — Telephone Encounter (Signed)
Thank you for alerting [place] that you did not receive your lab results; I apologize for that. The reports will be mailed.   Based on your prior advanced testing, your LDL goal is < 170 , ideally < 130. Your present LDL of 143.1  does NOT increase long term heart attack or stroke risk .All other labs are excellent.[employer]

## 2012-07-28 NOTE — Telephone Encounter (Signed)
Left message on voicemail informing patient labs to be mailed. Patient to call if she would like to know specific values prior to receiving mailed copy

## 2012-09-29 ENCOUNTER — Other Ambulatory Visit: Payer: Self-pay | Admitting: Internal Medicine

## 2013-04-24 ENCOUNTER — Telehealth: Payer: Self-pay | Admitting: Internal Medicine

## 2013-04-24 DIAGNOSIS — J310 Chronic rhinitis: Secondary | ICD-10-CM

## 2013-04-24 MED ORDER — LORATADINE 10 MG PO TABS: 10.0000 mg | ORAL_TABLET | Freq: Every day | ORAL | Status: DC

## 2013-04-24 NOTE — Telephone Encounter (Signed)
Rx sent 

## 2013-04-24 NOTE — Telephone Encounter (Signed)
Patient called requesting rx for loratadine. She states she can only take it by rx bc the OTC brand affects her BP. [facility] on [address]

## 2013-04-27 ENCOUNTER — Encounter: Payer: Self-pay | Admitting: *Deleted

## 2013-04-27 ENCOUNTER — Encounter: Payer: Self-pay | Admitting: Nurse Practitioner

## 2013-04-27 ENCOUNTER — Ambulatory Visit (INDEPENDENT_AMBULATORY_CARE_PROVIDER_SITE_OTHER): Payer: 59 | Admitting: Nurse Practitioner

## 2013-04-27 VITALS — BP 120/80 | HR 77 | Temp 98.8°F | Ht 69.0 in | Wt 174.8 lb

## 2013-04-27 DIAGNOSIS — J019 Acute sinusitis, unspecified: Secondary | ICD-10-CM

## 2013-04-27 DIAGNOSIS — J209 Acute bronchitis, unspecified: Secondary | ICD-10-CM

## 2013-04-27 MED ORDER — DOXYCYCLINE HYCLATE 100 MG PO TABS: 100.0000 mg | ORAL_TABLET | Freq: Two times a day (BID) | ORAL | Status: DC

## 2013-04-27 MED ORDER — BENZONATATE 100 MG PO CAPS: 100.0000 mg | ORAL_CAPSULE | Freq: Two times a day (BID) | ORAL | Status: DC | PRN

## 2013-04-27 NOTE — Progress Notes (Signed)
  Subjective:    Patient ID: [name], female    DOB: 1971-11-30, 42 y.o.   MRN: [id number]  URI  This is a new problem. The current episode started 1 to 4 weeks ago (3 weeks). The problem has been gradually worsening (started with sore throat, better now, but chest & sinus congestion worse.\). There has been no fever. Associated symptoms include congestion, coughing (day & night time cough), headaches (feel dull pressure behind face), rhinorrhea, sinus pain and a sore throat. Pertinent negatives include no abdominal pain, chest pain ("feels like mucous in chest"), diarrhea, ear pain (R ear feels full), nausea, neck pain or rash. She has tried nothing (started claritin today) for the symptoms. The treatment provided no relief.      Review of Systems  Constitutional: Positive for fatigue. Negative for fever.  HENT: Positive for congestion, sore throat, rhinorrhea and sinus pressure. Negative for ear pain (R ear feels full), neck pain and neck stiffness.   Eyes: Negative for itching.  Respiratory: Positive for cough (day & night time cough).   Cardiovascular: Negative for chest pain ("feels like mucous in chest").  Gastrointestinal: Negative for nausea, abdominal pain and diarrhea.  Musculoskeletal: Negative for back pain.  Skin: Negative for rash.  Allergic/Immunologic: Positive for environmental allergies.  Neurological: Positive for headaches (feel dull pressure behind face).  Hematological: Negative for adenopathy.       Objective:   Physical Exam  Nursing note and vitals reviewed. Constitutional: She is oriented to person, place, and time. She appears well-developed and well-nourished.  HENT:  Head: Normocephalic and atraumatic.  Right Ear: Hearing, external ear and ear canal normal. Tympanic membrane is injected. A middle ear effusion is present.  Left Ear: External ear and ear canal normal. A middle ear effusion is present.  Mouth/Throat: Mucous membranes are normal.  Posterior oropharyngeal erythema present. No oropharyngeal exudate or posterior oropharyngeal edema.  Eyes: Pupils are equal, round, and reactive to light.  Injected sclera, mild, looks tired  Neck: Normal range of motion. Neck supple. No thyromegaly present.  Cardiovascular: Normal rate, regular rhythm and normal heart sounds.   No murmur heard. Pulmonary/Chest: Effort normal and breath sounds normal. No respiratory distress. She has no wheezes.  Lymphadenopathy:    She has no cervical adenopathy.  Neurological: She is alert and oriented to person, place, and time.  Skin: Skin is warm and dry. No rash noted.  Psychiatric: She has a normal mood and affect. Her behavior is normal. Thought content normal.          Assessment & Plan:  1. Acute bronchitis  - benzonatate (TESSALON) 100 MG capsule; Take 1 capsule (100 mg total) by mouth 2 (two) times daily as needed for cough.  Dispense: 20 capsule; Refill: 0  2. Acute sinusitis  - doxycycline (VIBRA-TABS) 100 MG tablet; Take 1 tablet (100 mg total) by mouth 2 (two) times daily.  Dispense: 20 tablet; Refill: 0  See pt instructions.

## 2013-04-27 NOTE — Patient Instructions (Signed)
Sinusitis Sinusitis is redness, soreness, and swelling (inflammation) of the paranasal sinuses. Paranasal sinuses are air pockets within the bones of your face (beneath the eyes, the middle of the forehead, or above the eyes). In healthy paranasal sinuses, mucus is able to drain out, and air is able to circulate through them by way of your nose. However, when your paranasal sinuses are inflamed, mucus and air can become trapped. This can allow bacteria and other germs to grow and cause infection. Sinusitis can develop quickly and last only a short time (acute) or continue over a long period (chronic). Sinusitis that lasts for more than 12 weeks is considered chronic.  CAUSES  Causes of sinusitis include:  Allergies.  Structural abnormalities, such as displacement of the cartilage that separates your nostrils (deviated septum), which can decrease the air flow through your nose and sinuses and affect sinus drainage.  Functional abnormalities, such as when the small hairs (cilia) that line your sinuses and help remove mucus do not work properly or are not present. SYMPTOMS  Symptoms of acute and chronic sinusitis are the same. The primary symptoms are pain and pressure around the affected sinuses. Other symptoms include:  Upper toothache.  Earache.  Bronchitis Bronchitis is a problem of the air tubes leading to your lungs. This problem makes it hard for air to get in and out of the lungs. You may cough a lot because your air tubes are narrow. Going without care can cause lasting (chronic) bronchitis. HOME CARE   Drink enough fluids to keep your pee (urine) clear or pale yellow.  Use a cool mist humidifier.  Quit smoking if you smoke. If you keep smoking, the bronchitis might not get better.  Only take medicine as told by your doctor. GET HELP RIGHT AWAY IF:   Coughing keeps you awake.  You start to wheeze.  You become more sick or weak.  You have a hard time breathing or get short  of breath.  You cough up blood.  Coughing lasts more than 2 weeks.  You have a fever.  Your baby is older than 3 months with a rectal temperature of 102 F (38.9 C) or higher.  Your baby is 29 months old or younger with a rectal temperature of 100.4 F (38 C) or higher. MAKE SURE YOU:  Understand these instructions.  Will watch your condition.  Will get help right away if you are not doing well or get worse. Document Released: 05/25/2008 Document Revised: 02/29/2012 Document Reviewed: 11/08/2009 [facility] Patient Information 2013 [place], [place].   Headache.  Bad breath.  Decreased sense of smell and taste.  A cough, which worsens when you are lying flat.  Fatigue.  Fever.  Thick drainage from your nose, which often is green and may contain pus (purulent).  Swelling and warmth over the affected sinuses. DIAGNOSIS  Your caregiver will perform a physical exam. During the exam, your caregiver may:  Look in your nose for signs of abnormal growths in your nostrils (nasal polyps).  Tap over the affected sinus to check for signs of infection.  View the inside of your sinuses (endoscopy) with a special imaging device with a light attached (endoscope), which is inserted into your sinuses. If your caregiver suspects that you have chronic sinusitis, one or more of the following tests may be recommended:  Allergy tests.  Nasal culture A sample of mucus is taken from your nose and sent to a lab and screened for bacteria.  Nasal cytology A sample  of mucus is taken from your nose and examined by your caregiver to determine if your sinusitis is related to an allergy. TREATMENT  Most cases of acute sinusitis are related to a viral infection and will resolve on their own within 10 days. Sometimes medicines are prescribed to help relieve symptoms (pain medicine, decongestants, nasal steroid sprays, or saline sprays).  However, for sinusitis related to a bacterial infection, your  caregiver will prescribe antibiotic medicines. These are medicines that will help kill the bacteria causing the infection.  Rarely, sinusitis is caused by a fungal infection. In theses cases, your caregiver will prescribe antifungal medicine. For some cases of chronic sinusitis, surgery is needed. Generally, these are cases in which sinusitis recurs more than 3 times per year, despite other treatments. HOME CARE INSTRUCTIONS   Drink plenty of water. Water helps thin the mucus so your sinuses can drain more easily.  Use a humidifier.  Inhale steam 3 to 4 times a day (for example, sit in the bathroom with the shower running).  Apply a warm, moist washcloth to your face 3 to 4 times a day, or as directed by your caregiver.  Use saline nasal sprays to help moisten and clean your sinuses.  Take over-the-counter or prescription medicines for pain, discomfort, or fever only as directed by your caregiver. SEEK IMMEDIATE MEDICAL CARE IF:  You have increasing pain or severe headaches.  You have nausea, vomiting, or drowsiness.  You have swelling around your face.  You have vision problems.  You have a stiff neck.  You have difficulty breathing. MAKE SURE YOU:   Understand these instructions.  Will watch your condition.  Will get help right away if you are not doing well or get worse. Document Released: 12/07/2005 Document Revised: 02/29/2012 Document Reviewed: 12/22/2011 [facility] Patient Information 2013 [place], [place].   Take antibiotic as prescribed. Use mucinex as directed. Increase fluids to 52 to 60 oz. Daily, as this will help thin secretions. Use neilmed sinus rinse daily. Use benzonatate capsules as needed for cough. Call for re-evaluation if you develop fever, chest pain with inspiration, or worsened fatigue. Feel better!

## 2013-05-19 ENCOUNTER — Telehealth: Payer: Self-pay | Admitting: Internal Medicine

## 2013-05-19 NOTE — Telephone Encounter (Signed)
Spoke with patient, letter to be faxed

## 2013-05-19 NOTE — Telephone Encounter (Signed)
May 30,2014  To whom it may concern:     Mrs. [name] has been prescribed furosemide, a diuretic which would increase urination. It is recommended she have access to bathroom facilities as needed because of this medication. She should also stay well hydrated to prevent dehydration due to this diuretic medication. Thank you for your consideration.                                                                                                                                Douglass Rivers MD

## 2013-05-19 NOTE — Telephone Encounter (Signed)
Hopp please advise, patient is taking furosemide

## 2013-05-19 NOTE — Telephone Encounter (Signed)
Patient is calling requesting a letter to be sent to her school letting her administrator know that she takes medication that needs to allow her to drink a lot of water and have frequent bathroom visits. 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  Note can be sent to Fax#: [phone] Attn: Mr. Evaristo Bury

## 2013-06-05 ENCOUNTER — Ambulatory Visit (INDEPENDENT_AMBULATORY_CARE_PROVIDER_SITE_OTHER): Payer: 59 | Admitting: Internal Medicine

## 2013-06-05 ENCOUNTER — Encounter: Payer: Self-pay | Admitting: Internal Medicine

## 2013-06-05 VITALS — BP 118/76 | HR 76 | Resp 12 | Ht 69.0 in | Wt 174.0 lb

## 2013-06-05 DIAGNOSIS — J302 Other seasonal allergic rhinitis: Secondary | ICD-10-CM

## 2013-06-05 DIAGNOSIS — J309 Allergic rhinitis, unspecified: Secondary | ICD-10-CM

## 2013-06-05 DIAGNOSIS — Z0000 Encounter for general adult medical examination without abnormal findings: Secondary | ICD-10-CM

## 2013-06-05 LAB — CBC WITH DIFFERENTIAL/PLATELET
Basophils Relative: 0.4 % (ref 0.0–3.0)
Eosinophils Relative: 2.1 % (ref 0.0–5.0)
HCT: 42.5 % (ref 36.0–46.0)
Hemoglobin: 14.1 g/dL (ref 12.0–15.0)
Lymphs Abs: 1.5 10*3/uL (ref 0.7–4.0)
MCV: 88.3 fl (ref 78.0–100.0)
Monocytes Absolute: 0.3 10*3/uL (ref 0.1–1.0)
Monocytes Relative: 5.3 % (ref 3.0–12.0)
Neutro Abs: 4 10*3/uL (ref 1.4–7.7)
RBC: 4.82 Mil/uL (ref 3.87–5.11)
WBC: 6 10*3/uL (ref 4.5–10.5)

## 2013-06-05 LAB — HEPATIC FUNCTION PANEL
ALT: 12 U/L (ref 0–35)
AST: 15 U/L (ref 0–37)
Total Protein: 7.6 g/dL (ref 6.0–8.3)

## 2013-06-05 LAB — LIPID PANEL
Cholesterol: 235 mg/dL — ABNORMAL HIGH (ref 0–200)
Triglycerides: 65 mg/dL (ref 0.0–149.0)

## 2013-06-05 LAB — BASIC METABOLIC PANEL
BUN: 10 mg/dL (ref 6–23)
Chloride: 106 mEq/L (ref 96–112)
Potassium: 4 mEq/L (ref 3.5–5.1)
Sodium: 138 mEq/L (ref 135–145)

## 2013-06-05 MED ORDER — HYDROXYZINE HCL 10 MG PO TABS: 10.0000 mg | ORAL_TABLET | Freq: Four times a day (QID) | ORAL | Status: DC | PRN

## 2013-06-05 MED ORDER — FLUTICASONE PROPIONATE 50 MCG/ACT NA SUSP: 1.0000 | Freq: Two times a day (BID) | NASAL | Status: DC | PRN

## 2013-06-05 NOTE — Progress Notes (Signed)
  Subjective:    Patient ID: [name], female    DOB: 10-15-1971, 42 y.o.   MRN: [id number]  HPI  She is here for a physical;acute issues include intermittent "welts " of hips & thighs.     Review of Systems She did does have seasonal allergies associated with itchy, watery eyes and sneezing. The symptoms may or may not accompany the apparent urticaria. She denies associated angioedema,wheezing or shortness of breath. She has no history of asthma although her mother & MGM were asthmatic.     Objective:   Physical Exam Gen.: Healthy and well-nourished in appearance. Alert, appropriate and cooperative throughout exam. Appears younger than stated age  Head: Normocephalic without obvious abnormalities Eyes: No corneal or conjunctival inflammation noted.  Extraocular motion intact. Vision grossly normal without lenses Ears: External  ear exam reveals no significant lesions or deformities. Canals clear .TMs normal. Hearing is grossly normal bilaterally. Nose: External nasal exam reveals no deformity or inflammation. Nasal mucosa are pink and moist. No lesions or exudates noted.  Mouth: Oral mucosa and oropharynx reveal no lesions or exudates. Teeth in good repair. Neck: No deformities, masses, or tenderness noted. Range of motion & Thyroid normal. Lungs: Normal respiratory effort; chest expands symmetrically. Lungs are clear to auscultation without rales, wheezes, or increased work of breathing. Heart: Normal rate and rhythm. Normal S1 and S2. No gallop, click, or rub. No MR murmur heard. Abdomen: Bowel sounds normal; abdomen soft and nontender. No masses, organomegaly or hernias noted. Genitalia: As per Gyn                                  Musculoskeletal/extremities: No deformity or scoliosis noted of  the thoracic or lumbar spine.  No clubbing, cyanosis, edema, or significant extremity  deformity noted. Range of motion normal .Tone & strength  Normal. Joints normal . Nail health  good. Able to lie down & sit up w/o help. Negative SLR bilaterally Vascular: Carotid, radial artery, dorsalis pedis and  posterior tibial pulses are full and equal. No bruits present. Neurologic: Alert and oriented x3. Deep tendon reflexes symmetrical and normal.      Skin: Intact without suspicious lesions or rashes.No dermatographia Lymph: No cervical, axillary lymphadenopathy present. Psych: Mood and affect are normal. Normally interactive                                                                                     Assessment & Plan:  #1 comprehensive physical exam; no acute findings #2 ? Lower extremity urticaria  Plan: see Orders  & Recommendations

## 2013-06-05 NOTE — Patient Instructions (Addendum)
Plain Mucinex (NOT D) for thick secretions ;force NON dairy fluids .   Nasal cleansing in the shower as discussed with lather of mild shampoo.After 10 seconds wash off lather while  exhaling through nostrils. Make sure that all residual soap is removed to prevent irritation.  Fluticasone 1 spray in each nostril twice a day as needed. Use the "crossover" technique into opposite nostril spraying toward opposite ear @ 45 degree angle, not straight up into nostril.  Use a Neti pot daily only  as needed for significant sinus congestion; going from open side to congested side . Plain Allegra (NOT D )  160 daily , Loratidine 10 mg , OR Zyrtec 10 mg @ bedtime  as needed for itchy eyes & sneezing.    If you activate the  My Chart system; lab & Xray results will be released directly  to you as soon as I review & address these through the computer. If you choose not to sign up for My Chart within 36 hours of labs being drawn; results will be reviewed & interpretation added before being copied & mailed, causing a delay in getting the results to you.If you do not receive that report within 7-10 days ,please call. Additionally you can use this system to gain direct  access to your records  if  out of town or @ an office of a  physician who is not in  the My Chart network.  This improves continuity of care & places you in control of your medical record.  

## 2013-08-20 ENCOUNTER — Other Ambulatory Visit: Payer: Self-pay | Admitting: Internal Medicine

## 2013-08-23 ENCOUNTER — Other Ambulatory Visit: Payer: Self-pay | Admitting: *Deleted

## 2013-08-23 DIAGNOSIS — J310 Chronic rhinitis: Secondary | ICD-10-CM

## 2013-08-23 MED ORDER — LORATADINE 10 MG PO TABS: 10.0000 mg | ORAL_TABLET | Freq: Every day | ORAL | Status: DC

## 2013-08-23 NOTE — Telephone Encounter (Signed)
Rx was refilled for loratadine 10 mg.  Ag cma

## 2013-11-27 ENCOUNTER — Other Ambulatory Visit: Payer: Self-pay | Admitting: Internal Medicine

## 2013-11-28 ENCOUNTER — Other Ambulatory Visit: Payer: Self-pay | Admitting: *Deleted

## 2013-11-28 MED ORDER — FLUOXETINE HCL 10 MG PO TABS: ORAL_TABLET | ORAL | Status: DC

## 2013-11-28 NOTE — Telephone Encounter (Signed)
Fluoxetine refilled.

## 2013-11-28 NOTE — Telephone Encounter (Signed)
Metoprolol refilled per protocol, JG//CMA

## 2014-06-06 ENCOUNTER — Ambulatory Visit (INDEPENDENT_AMBULATORY_CARE_PROVIDER_SITE_OTHER): Payer: 59 | Admitting: Internal Medicine

## 2014-06-06 ENCOUNTER — Other Ambulatory Visit (INDEPENDENT_AMBULATORY_CARE_PROVIDER_SITE_OTHER): Payer: 59

## 2014-06-06 ENCOUNTER — Encounter: Payer: Self-pay | Admitting: Internal Medicine

## 2014-06-06 VITALS — BP 128/80 | HR 69 | Temp 98.1°F | Resp 12 | Ht 69.0 in | Wt 171.6 lb

## 2014-06-06 DIAGNOSIS — E785 Hyperlipidemia, unspecified: Secondary | ICD-10-CM

## 2014-06-06 DIAGNOSIS — Z0000 Encounter for general adult medical examination without abnormal findings: Secondary | ICD-10-CM

## 2014-06-06 LAB — LIPID PANEL
Cholesterol: 231 mg/dL — ABNORMAL HIGH (ref 0–200)
HDL: 69 mg/dL (ref >39.00)
LDL Cholesterol: 146 mg/dL — ABNORMAL HIGH (ref 0–99)
NonHDL: 162
TRIGLYCERIDES: 79 mg/dL (ref 0.0–149.0)
Total CHOL/HDL Ratio: 3
VLDL: 15.8 mg/dL (ref 0.0–40.0)

## 2014-06-06 LAB — CBC WITH DIFFERENTIAL/PLATELET
Basophils Absolute: 0 10*3/uL (ref 0.0–0.1)
Basophils Relative: 0.3 % (ref 0.0–3.0)
EOS PCT: 3 % (ref 0.0–5.0)
Eosinophils Absolute: 0.2 10*3/uL (ref 0.0–0.7)
HCT: 39.9 % (ref 36.0–46.0)
Hemoglobin: 13.5 g/dL (ref 12.0–15.0)
LYMPHS ABS: 1.3 10*3/uL (ref 0.7–4.0)
Lymphocytes Relative: 24 % (ref 12.0–46.0)
MCHC: 33.9 g/dL (ref 30.0–36.0)
MCV: 85.7 fl (ref 78.0–100.0)
MONO ABS: 0.3 10*3/uL (ref 0.1–1.0)
MONOS PCT: 6.4 % (ref 3.0–12.0)
Neutro Abs: 3.5 10*3/uL (ref 1.4–7.7)
Neutrophils Relative %: 66.3 % (ref 43.0–77.0)
PLATELETS: 288 10*3/uL (ref 150.0–400.0)
RBC: 4.66 Mil/uL (ref 3.87–5.11)
RDW: 14 % (ref 11.5–15.5)
WBC: 5.3 10*3/uL (ref 4.0–10.5)

## 2014-06-06 LAB — BASIC METABOLIC PANEL
BUN: 10 mg/dL (ref 6–23)
CHLORIDE: 106 meq/L (ref 96–112)
CO2: 27 mEq/L (ref 19–32)
Calcium: 8.7 mg/dL (ref 8.4–10.5)
Creatinine, Ser: 0.8 mg/dL (ref 0.4–1.2)
GFR: 103.88 mL/min (ref >60.00)
GLUCOSE: 85 mg/dL (ref 70–99)
Potassium: 4.1 mEq/L (ref 3.5–5.1)
SODIUM: 138 meq/L (ref 135–145)

## 2014-06-06 LAB — HEPATIC FUNCTION PANEL
ALBUMIN: 4.2 g/dL (ref 3.5–5.2)
ALT: 15 U/L (ref 0–35)
AST: 17 U/L (ref 0–37)
Alkaline Phosphatase: 73 U/L (ref 39–117)
BILIRUBIN TOTAL: 0.7 mg/dL (ref 0.2–1.2)
Bilirubin, Direct: 0.1 mg/dL (ref 0.0–0.3)
Total Protein: 7.2 g/dL (ref 6.0–8.3)

## 2014-06-06 LAB — TSH: TSH: 1.48 u[IU]/mL (ref 0.35–4.50)

## 2014-06-06 NOTE — Patient Instructions (Signed)
Your next office appointment will be determined based upon review of your pending labs . Those instructions will be transmitted to you through My Chart  OR  by mail;whichever process is your choice to receive results & recommendations .   Minimal Blood Pressure Goal= AVERAGE < 140/90;  Ideal is an AVERAGE < 135/85. This AVERAGE should be calculated from @ least 5-7 BP readings taken @ different times of day on different days of week. You should not respond to isolated BP readings , but rather the AVERAGE for that week .Please bring your  blood pressure cuff to office visits to verify that it is reliable.It  can also be checked against the blood pressure device at the pharmacy. Finger or wrist cuffs are not dependable; an arm cuff is.   The best exercises for the low back include freestyle swimming, stretch aerobics, and yoga.Cybex & Nautilus machines rather than dead weights are better for the back.  Antibiotics prior to dental work or surgery are not necessary as you do not have significant valvular heart disease. The murmur should be monitored annually.

## 2014-06-06 NOTE — Progress Notes (Signed)
   Subjective:    Patient ID: [name], female    DOB: June 21, 1971, 43 y.o.   MRN: [id number]  HPI  She is here for a physical;acute issues denied except intermittent LBP. 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  Blood pressure range / average : no monitor Compliant with anti hypertemsive medication. No lightheadedness or other adverse medication effect described.  A heart healthy is not followed.A low salt diet is followed. Exercise encompasses 40 minutes 3  times per week as  Walking without symptoms.     Review of Systems   Significant headaches, epistaxis, chest pain, exertional dyspnea, claudication, paroxysmal nocturnal dyspnea, or edema absent. Intermittent palpitations only @ rest.        Objective:   Physical Exam Gen.: Healthy and well-nourished in appearance. Alert, appropriate and cooperative throughout exam. Appears younger than stated age  Head: Normocephalic without obvious abnormalities Eyes: No corneal or conjunctival inflammation noted. Pupils equal round reactive to light and accommodation. Extraocular motion intact.  Ears: External  ear exam reveals no significant lesions or deformities. Canals clear .TMs normal. Hearing is grossly normal bilaterally. Nose: External nasal exam reveals no deformity or inflammation. Nasal mucosa are pink and moist. No lesions or exudates noted.   Mouth: Oral mucosa and oropharynx reveal no lesions or exudates. Teeth in good repair. Neck: No deformities, masses, or tenderness noted. Range of motion & Thyroid normal. Lungs: Normal respiratory effort; chest expands symmetrically. Lungs are clear to auscultation without rales, wheezes, or increased work of breathing. Heart: Normal rate and rhythm. Normal S1 and S2. No gallop, or rub. Minor click w/o murmur. Abdomen: Bowel sounds normal; abdomen soft and nontender. No masses, organomegaly or hernias noted. Genitalia:  as per Gyn                                    Musculoskeletal/extremities: No deformity or scoliosis noted of  the thoracic or lumbar spine.  No clubbing, cyanosis, edema, or significant extremity  deformity noted. Range of motion normal .Tone & strength normal. Hand joints normal Fingernail / toenail health good. Able to lie down & sit up w/o help. Negative SLR bilaterally Vascular: Carotid, radial artery, dorsalis pedis and  posterior tibial pulses are full and equal. No bruits present. Neurologic: Alert and oriented x3. Deep tendon reflexes symmetrical and normal.  Gait normal .       Skin: Intact without suspicious lesions or rashes. Lymph: No cervical, axillary lymphadenopathy present. Psych: Mood and affect are normal. Normally interactive                                                                                        Assessment & Plan:  #1 comprehensive physical exam; no acute findings  Plan: see Orders  & Recommendations

## 2014-06-06 NOTE — Progress Notes (Signed)
Pre visit review using our clinic review tool, if applicable. No additional management support is needed unless otherwise documented below in the visit note. 

## 2014-10-04 LAB — HM MAMMOGRAPHY

## 2014-10-29 DIAGNOSIS — H8111 Benign paroxysmal vertigo, right ear: Secondary | ICD-10-CM | POA: Insufficient documentation

## 2014-10-29 DIAGNOSIS — Z8639 Personal history of other endocrine, nutritional and metabolic disease: Secondary | ICD-10-CM | POA: Insufficient documentation

## 2014-10-29 DIAGNOSIS — Z8619 Personal history of other infectious and parasitic diseases: Secondary | ICD-10-CM | POA: Diagnosis not present

## 2014-10-29 DIAGNOSIS — F419 Anxiety disorder, unspecified: Secondary | ICD-10-CM | POA: Diagnosis not present

## 2014-10-29 DIAGNOSIS — Z7982 Long term (current) use of aspirin: Secondary | ICD-10-CM | POA: Diagnosis not present

## 2014-10-29 DIAGNOSIS — R110 Nausea: Secondary | ICD-10-CM | POA: Diagnosis not present

## 2014-10-29 DIAGNOSIS — Z880 Allergy status to penicillin: Secondary | ICD-10-CM | POA: Insufficient documentation

## 2014-10-29 DIAGNOSIS — Z79899 Other long term (current) drug therapy: Secondary | ICD-10-CM | POA: Diagnosis not present

## 2014-10-29 DIAGNOSIS — I10 Essential (primary) hypertension: Secondary | ICD-10-CM | POA: Insufficient documentation

## 2014-10-29 DIAGNOSIS — R42 Dizziness and giddiness: Secondary | ICD-10-CM | POA: Diagnosis present

## 2014-10-30 ENCOUNTER — Encounter (HOSPITAL_COMMUNITY): Payer: Self-pay | Admitting: Emergency Medicine

## 2014-10-30 ENCOUNTER — Emergency Department (HOSPITAL_COMMUNITY)
Admission: EM | Admit: 2014-10-30 | Discharge: 2014-10-30 | Disposition: A | Discharge status: Home / Self Care | Payer: 59 | Attending: Emergency Medicine | Admitting: Emergency Medicine

## 2014-10-30 DIAGNOSIS — H8111 Benign paroxysmal vertigo, right ear: Secondary | ICD-10-CM

## 2014-10-30 HISTORY — DX: Essential (primary) hypertension: I10

## 2014-10-30 LAB — COMPREHENSIVE METABOLIC PANEL
ALT: 14 U/L (ref 0–35)
AST: 18 U/L (ref 0–37)
Albumin: 3.5 g/dL (ref 3.5–5.2)
Alkaline Phosphatase: 81 U/L (ref 39–117)
Anion gap: 14 (ref 5–15)
BUN: 11 mg/dL (ref 6–23)
CALCIUM: 8.5 mg/dL (ref 8.4–10.5)
CO2: 23 meq/L (ref 19–32)
CREATININE: 0.86 mg/dL (ref 0.50–1.10)
Chloride: 99 mEq/L (ref 96–112)
GFR, EST NON AFRICAN AMERICAN: 82 mL/min — AB (ref >90)
GLUCOSE: 108 mg/dL — AB (ref 70–99)
Potassium: 4 mEq/L (ref 3.7–5.3)
Sodium: 136 mEq/L — ABNORMAL LOW (ref 137–147)
Total Bilirubin: 0.2 mg/dL — ABNORMAL LOW (ref 0.3–1.2)
Total Protein: 7 g/dL (ref 6.0–8.3)

## 2014-10-30 LAB — CBC WITH DIFFERENTIAL/PLATELET
Basophils Absolute: 0.1 10*3/uL (ref 0.0–0.1)
Basophils Relative: 2 % — ABNORMAL HIGH (ref 0–1)
EOS PCT: 2 % (ref 0–5)
Eosinophils Absolute: 0.2 10*3/uL (ref 0.0–0.7)
HEMATOCRIT: 38.6 % (ref 36.0–46.0)
HEMOGLOBIN: 12.7 g/dL (ref 12.0–15.0)
LYMPHS ABS: 2 10*3/uL (ref 0.7–4.0)
LYMPHS PCT: 26 % (ref 12–46)
MCH: 28 pg (ref 26.0–34.0)
MCHC: 32.9 g/dL (ref 30.0–36.0)
MCV: 85 fL (ref 78.0–100.0)
MONO ABS: 0.5 10*3/uL (ref 0.1–1.0)
MONOS PCT: 6 % (ref 3–12)
NEUTROS ABS: 5 10*3/uL (ref 1.7–7.7)
Neutrophils Relative %: 64 % (ref 43–77)
Platelets: 267 10*3/uL (ref 150–400)
RBC: 4.54 MIL/uL (ref 3.87–5.11)
RDW: 13.6 % (ref 11.5–15.5)
WBC: 7.7 10*3/uL (ref 4.0–10.5)

## 2014-10-30 MED ORDER — MECLIZINE HCL 25 MG PO TABS
25.0000 mg | ORAL_TABLET | Freq: Once | ORAL | Status: AC
  Administered 2014-10-30: 25 mg via ORAL
  Filled 2014-10-30: qty 1

## 2014-10-30 MED ORDER — MECLIZINE HCL 25 MG PO TABS: 25.0000 mg | ORAL_TABLET | Freq: Three times a day (TID) | ORAL | Status: DC | PRN

## 2014-10-30 NOTE — ED Notes (Signed)
Pt. woke up this evening with dizziness " room spinning " and mild nausea , head pressure. Denies fever or chills.

## 2014-10-30 NOTE — ED Provider Notes (Signed)
CSN: [id number]     Arrival date & time 10/29/14  2359 History   First MD Initiated Contact with Patient 10/30/14 0155     No chief complaint on file.    (Consider location/radiation/quality/duration/timing/severity/associated sxs/prior Treatment) HPI 43 year old female presents to emergency room with complaint of the room spinning.  Patient reports she woke, rolled from her left side onto her right and suddenly had onset of sensation to room was moving around her.  She denies previous history of vertigo.  She denies any headache.  She has had some nausea.  Patient was concerned that she was having elevated blood pressure.  She reports that she has been working to get her blood pressure pills down.  Patient reports at this time the vertigo has stopped, but she did have another episode of it when she was having her orthostatic vital signs taken.  She denies any recent colds or sinus congestion or decreased hearing.  She denies any focal weakness numbness difficulty speaking or swallowing.  Past medical history of PVCs, anxiety and hypertension. Past Medical History  Diagnosis Date  . Premature ventricular contractions   . Anxiety   . Hyperlipidemia   . Gilbert syndrome   . Herpes exposure     Type 1 & 2 ; Dr Garwin Brothers, [place]  . Hypertension    Past Surgical History  Procedure Laterality Date  . Cesarean section      X 2, Dr Garwin Brothers  . Tubal ligation    . Wisdom tooth extraction     Family History  Problem Relation Age of Onset  . Asthma Mother   . Stroke Father 63  . Colon cancer Father   . Diabetes Sister   . Hypertension Brother   . Transient ischemic attack Brother 41  . Heart disease Neg Hx   . Asthma Maternal Grandmother     died @ 31 of status asthmaticus   History  Substance Use Topics  . Smoking status: Never Smoker   . Smokeless tobacco: Not on file  . Alcohol Use: No   OB History    No data available     Review of Systems   See History of Present Illness;  otherwise all other systems are reviewed and negative  Allergies  Cephalexin and Penicillins  Home Medications   Prior to Admission medications   Medication Sig Start Date End Date Taking? Authorizing Provider  FLUoxetine (PROZAC) 10 MG tablet Take one or two tabs by mouth daily as directed Patient taking differently: Take 10 mg by mouth daily.  11/28/13  Yes Hendricks Limes, MD  metoprolol tartrate (LOPRESSOR) 25 MG tablet Take 12.5 mg by mouth daily.   Yes Historical Provider, MD  ondansetron (ZOFRAN) 8 MG tablet Take 8 mg by mouth every 8 (eight) hours as needed for nausea or vomiting.    Yes Historical Provider, MD  aspirin 81 MG tablet Take 81 mg by mouth. 1 by mouth 2 x weekly    Historical Provider, MD  chlorhexidine (HIBICLENS) 4 % external liquid Clense 1-2 time daily as needed 06/07/12   Hendricks Limes, MD  hydrOXYzine (ATARAX/VISTARIL) 10 MG tablet Take 1 tablet (10 mg total) by mouth every 6 (six) hours as needed for itching. 06/05/13   Hendricks Limes, MD  loratadine (CLARITIN) 10 MG tablet Take 1 tablet (10 mg total) by mouth daily. 08/23/13 08/23/14  Hendricks Limes, MD  metoprolol tartrate (LOPRESSOR) 25 MG tablet TAKE ONE TABLET BY MOUTH TWICE DAILY FOR PALPITATIONS  OR BLOOD PRESSURE>130/85 11/27/13   Hendricks Limes, MD  Multiple Vitamin (MULTIVITAMIN) tablet Take 1 tablet by mouth daily.      Historical Provider, MD  Omega-3 Fatty Acids (FISH OIL PO) Take by mouth daily.      Historical Provider, MD   BP 133/80 mmHg  Pulse 67  Temp(Src) 98 F (36.7 C) (Oral)  Resp 8  Ht 5\' 9"  (1.753 m)  Wt 174 lb (78.926 kg)  BMI 25.68 kg/m2  SpO2 100%  LMP 10/23/2014 Physical Exam  Constitutional: She is oriented to person, place, and time. She appears well-developed and well-nourished.  HENT:  Head: Normocephalic and atraumatic.  Right Ear: External ear normal.  Left Ear: External ear normal.  Nose: Nose normal.  Mouth/Throat: Oropharynx is clear and moist.  Eyes:  Conjunctivae and EOM are normal. Pupils are equal, round, and reactive to light.  Neck: Normal range of motion. Neck supple. No JVD present. No tracheal deviation present. No thyromegaly present.  Cardiovascular: Normal rate, regular rhythm, normal heart sounds and intact distal pulses.  Exam reveals no gallop and no friction rub.   No murmur heard. Pulmonary/Chest: Effort normal and breath sounds normal. No stridor. No respiratory distress. She has no wheezes. She has no rales. She exhibits no tenderness.  Abdominal: Soft. Bowel sounds are normal. She exhibits no distension and no mass. There is no tenderness. There is no rebound and no guarding.  Musculoskeletal: Normal range of motion. She exhibits no edema or tenderness.  Lymphadenopathy:    She has no cervical adenopathy.  Neurological: She is alert and oriented to person, place, and time. She displays normal reflexes. No cranial nerve deficit. She exhibits normal muscle tone. Coordination normal.  Patient's vertigo is reproduced with right head movement with nystagmus noted  Skin: Skin is warm and dry. No rash noted. No erythema. No pallor.  Psychiatric: She has a normal mood and affect. Her behavior is normal. Judgment and thought content normal.  Nursing note and vitals reviewed.   ED Course  Procedures (including critical care time) Labs Review Labs Reviewed  CBC WITH DIFFERENTIAL - Abnormal; Notable for the following:    Basophils Relative 2 (*)    All other components within normal limits  COMPREHENSIVE METABOLIC PANEL - Abnormal; Notable for the following:    Sodium 136 (*)    Glucose, Bld 108 (*)    Total Bilirubin 0.2 (*)    GFR calc non Af Amer 82 (*)    All other components within normal limits    Imaging Review No results found.   EKG Interpretation   Date/Time:  Tuesday October 30 2014 01:13:07 EST Ventricular Rate:  62 PR Interval:  137 QRS Duration: 79 QT Interval:  426 QTC Calculation: 433 R Axis:    75 Text Interpretation:  Sinus rhythm Confirmed by Ridley Dileo  MD, Malachy Coleman ([id number]) on  10/30/2014 1:18:47 AM      MDM   Final diagnoses:  BPPV (benign paroxysmal positional vertigo), right    43 year old female with what appears to be benign positional vertigo.  Plan to discharge home with Epley maneuver, meclizine.   Kalman Drape, MD 10/30/14 [phone]

## 2014-10-30 NOTE — Discharge Instructions (Signed)
Benign Positional Vertigo °Vertigo means you feel like you or your surroundings are moving when they are not. Benign positional vertigo is the most common form of vertigo. Benign means that the cause of your condition is not serious. Benign positional vertigo is more common in older adults. °CAUSES  °Benign positional vertigo is the result of an upset in the labyrinth system. This is an area in the middle ear that helps control your balance. This may be caused by a viral infection, head injury, or repetitive motion. However, often no specific cause is found. °SYMPTOMS  °Symptoms of benign positional vertigo occur when you move your head or eyes in different directions. Some of the symptoms may include: °1. Loss of balance and falls. °2. Vomiting. °3. Blurred vision. °4. Dizziness. °5. Nausea. °6. Involuntary eye movements (nystagmus). °DIAGNOSIS  °Benign positional vertigo is usually diagnosed by physical exam. If the specific cause of your benign positional vertigo is unknown, your caregiver may perform imaging tests, such as magnetic resonance imaging (MRI) or computed tomography (CT). °TREATMENT  °Your caregiver may recommend movements or procedures to correct the benign positional vertigo. Medicines such as meclizine, benzodiazepines, and medicines for nausea may be used to treat your symptoms. In rare cases, if your symptoms are caused by certain conditions that affect the inner ear, you may need surgery. °HOME CARE INSTRUCTIONS  °· Follow your caregiver's instructions. °· Move slowly. Do not make sudden body or head movements. °· Avoid driving. °· Avoid operating heavy machinery. °· Avoid performing any tasks that would be dangerous to you or others during a vertigo episode. °· Drink enough fluids to keep your urine clear or pale yellow. °SEEK IMMEDIATE MEDICAL CARE IF:  °· You develop problems with walking, weakness, numbness, or using your arms, hands, or legs. °· You have difficulty speaking. °· You develop  severe headaches. °· Your nausea or vomiting continues or gets worse. °· You develop visual changes. °· Your family or friends notice any behavioral changes. °· Your condition gets worse. °· You have a fever. °· You develop a stiff neck or sensitivity to light. °MAKE SURE YOU:  °· Understand these instructions. °· Will watch your condition. °· Will get help right away if you are not doing well or get worse. °Document Released: 09/14/2006 Document Revised: 02/29/2012 Document Reviewed: 08/27/2011 °ExitCare® Patient Information ©2015 [employer]. This information is not intended to replace advice given to you by your health care provider. Make sure you discuss any questions you have with your health care provider. ° °Epley Maneuver Self-Care °WHAT IS THE EPLEY MANEUVER? °The Epley maneuver is an exercise you can do to relieve symptoms of benign paroxysmal positional vertigo (BPPV). This condition is often just referred to as vertigo. BPPV is caused by the movement of tiny crystals (canaliths) inside your inner ear. The accumulation and movement of canaliths in your inner ear causes a sudden spinning sensation (vertigo) when you move your head to certain positions. Vertigo usually lasts about 30 seconds. BPPV usually occurs in just one ear. If you get vertigo when you lie on your left side, you probably have BPPV in your left ear. Your health care provider can tell you which ear is involved.  °BPPV may be caused by a head injury. Many people older than 50 get BPPV for unknown reasons. If you have been diagnosed with BPPV, your health care provider may teach you how to do this maneuver. BPPV is not life threatening (benign) and usually goes away in time.  °  WHEN SHOULD I PERFORM THE EPLEY MANEUVER? °You can do this maneuver at home whenever you have symptoms of vertigo. You may do the Epley maneuver up to 3 times a day until your symptoms of vertigo go away. °HOW SHOULD I DO THE EPLEY MANEUVER? °7. Sit on the edge of a  bed or table with your back straight. Your legs should be extended or hanging over the edge of the bed or table.   °8. Turn your head halfway toward the affected ear.   °9. Lie backward quickly with your head turned until you are lying flat on your back. You may want to position a pillow under your shoulders.   °10. Hold this position for 30 seconds. You may experience an attack of vertigo. This is normal. Hold this position until the vertigo stops. °11. Then turn your head to the opposite direction until your unaffected ear is facing the floor.   °12. Hold this position for 30 seconds. You may experience an attack of vertigo. This is normal. Hold this position until the vertigo stops. °13. Now turn your whole body to the same side as your head. Hold for another 30 seconds.   °14. You can then sit back up. °ARE THERE RISKS TO THIS MANEUVER? °In some cases, you may have other symptoms (such as changes in your vision, weakness, or numbness). If you have these symptoms, stop doing the maneuver and call your health care provider. Even if doing these maneuvers relieves your vertigo, you may still have dizziness. Dizziness is the sensation of light-headedness but without the sensation of movement. Even though the Epley maneuver may relieve your vertigo, it is possible that your symptoms will return within 5 years. °WHAT SHOULD I DO AFTER THIS MANEUVER? °After doing the Epley maneuver, you can return to your normal activities. Ask your doctor if there is anything you should do at home to prevent vertigo. This may include: °· Sleeping with two or more pillows to keep your head elevated. °· Not sleeping on the side of your affected ear. °· Getting up slowly from bed. °· Avoiding sudden movements during the day. °· Avoiding extreme head movement, like looking up or bending over. °· Wearing a cervical collar to prevent sudden head movements. °WHAT SHOULD I DO IF MY SYMPTOMS GET WORSE? °Call your health care provider if your  vertigo gets worse. Call your provider right way if you have other symptoms, including:  °· Nausea. °· Vomiting. °· Headache. °· Weakness. °· Numbness. °· Vision changes. °Document Released: 12/12/2013 Document Reviewed: 12/12/2013 °ExitCare® Patient Information ©2015 [employer]. This information is not intended to replace advice given to you by your health care provider. Make sure you discuss any questions you have with your health care provider. ° °

## 2014-10-30 NOTE — ED Notes (Signed)
Pt sts she is not very compliant with blood pressure medication.  Stating that she was trying to get off of medication at home by taking half of the prescribed doses. No neuro deficits noted during assessment.  Pt only reporting dizziness "room spinning", no weakness, headache, chest pain, shortness of breath, blurred vision or double vision reported.

## 2014-11-14 ENCOUNTER — Other Ambulatory Visit: Payer: Self-pay | Admitting: Internal Medicine

## 2014-11-14 NOTE — Telephone Encounter (Signed)
OK X 3 mos 

## 2014-11-26 ENCOUNTER — Ambulatory Visit (INDEPENDENT_AMBULATORY_CARE_PROVIDER_SITE_OTHER): Payer: 59 | Admitting: Internal Medicine

## 2014-11-26 ENCOUNTER — Encounter: Payer: Self-pay | Admitting: Internal Medicine

## 2014-11-26 VITALS — BP 130/90 | HR 64 | Temp 98.4°F | Resp 14 | Wt 178.4 lb

## 2014-11-26 DIAGNOSIS — I10 Essential (primary) hypertension: Secondary | ICD-10-CM

## 2014-11-26 DIAGNOSIS — H8111 Benign paroxysmal vertigo, right ear: Secondary | ICD-10-CM

## 2014-11-26 DIAGNOSIS — H9391 Unspecified disorder of right ear: Secondary | ICD-10-CM

## 2014-11-26 NOTE — Progress Notes (Signed)
Subjective:    Patient ID: [name], female    DOB: 05-Apr-1971, 43 y.o.   MRN: [id number]  HPI   The emergency room record 10/30/14 was reviewed. She was seen for classical benign positional vertigo which began when she rolled to her right in bed  He recurred in the emergency room when she was placed in supine position for orthostatic blood pressure checks.  Meclizine did help but made her "spacey". They also taught her the Epley maneuver but she did not feel that this was of benefit  She's continued to have symptoms missed despite sleeping in a recliner or with several pillows.  No URI, neuro or cardiac prodrome.  Minor tinnitus 2 days ago on L.No hearing loss.   Review of Systems  Frontal headache, facial pain , nasal purulence, dental pain, sore throat , otic pain or otic discharge denied. No fever , chills or sweats.  Denied were any change in heart rhythm or rate prior to the event. There was no associated chest pain or shortness of breath .  Also specifically denied prior to the episode were headache, limb weakness, tingling, or numbness. No seizure activity noted.      Objective:   Physical Exam Gen.: Healthy and well-nourished in appearance. Alert, appropriate and cooperative throughout exam. Appears younger than stated age  Head: Normocephalic without obvious abnormalities  Eyes: No corneal or conjunctival inflammation noted. Pupils equal round reactive to light and accommodation. Extraocular motion intact. Field of Vision grossly normal Ears: External  ear exam reveals no significant lesions or deformities. Canals clear .TMs normal. Hearing is grossly normal bilaterally.Tuning fork lateralizes to L Nose: External nasal exam reveals no deformity or inflammation. Nasal mucosa are pink and moist. No lesions or exudates noted.   Mouth: Oral mucosa and oropharynx reveal no lesions or exudates. Teeth in good repair. Neck: No deformities, masses, or tenderness noted.  Range of motion &. Thyroid normal Lungs: Normal respiratory effort; chest expands symmetrically. Lungs are clear to auscultation without rales, wheezes, or increased work of breathing. Heart: Normal rate and rhythm. Normal S1 and S2. No gallop, click, or rub. No murmur.                              Musculoskeletal/extremities: No deformity or scoliosis noted of  the thoracic or lumbar spine.  No clubbing, cyanosis, edema, or significant extremity  deformity noted.  Range of motion normal . Tone & strength normal. Hand joints normal  Fingernail health good. Vascular: Carotid, radial artery pulses are full and equal. No bruits present. Neurologic: Alert and oriented x3. Deep tendon reflexes symmetrical and normal. No cranial nerve deficit except tuning fork findings. Gait normal  including heel & toe walking .  Rhomberg & finger to nose negative      Skin: Intact without suspicious lesions or rashes. Lymph: No cervical, axillary lymphadenopathy present. Psych: Mood and affect are normal. Normally interactive                                                                                        Assessment &  Plan:  #1 BPV #2 abnormal tuning fork exam #3 HTN See orders

## 2014-11-26 NOTE — Progress Notes (Signed)
Pre visit review using our clinic review tool, if applicable. No additional management support is needed unless otherwise documented below in the visit note. 

## 2014-11-26 NOTE — Patient Instructions (Signed)
Minimal Blood Pressure Goal= AVERAGE < 140/90;  Ideal is an AVERAGE < 135/85. This AVERAGE should be calculated from @ least 5-7 BP readings taken @ different times of day on different days of week. You should not respond to isolated BP readings , but rather the AVERAGE for that week .Please bring your  blood pressure cuff to office visits to verify that it is reliable.It  can also be checked against the blood pressure device at the pharmacy. Finger or wrist cuffs are not dependable; an arm cuff is. The ENT referral will be scheduled and you'll be notified of the time.Please call the Referral Co-Ordinator @ [phone] if you have not been notified of appointment time within 7-10 days.

## 2014-11-27 ENCOUNTER — Telehealth: Payer: Self-pay | Admitting: Internal Medicine

## 2014-11-27 NOTE — Telephone Encounter (Signed)
emmi emailed °

## 2015-01-21 LAB — HM PAP SMEAR

## 2015-01-23 ENCOUNTER — Other Ambulatory Visit: Payer: Self-pay | Admitting: Internal Medicine

## 2015-02-28 ENCOUNTER — Other Ambulatory Visit: Payer: Self-pay

## 2015-02-28 MED ORDER — LORATADINE 10 MG PO TABS: 10.0000 mg | ORAL_TABLET | Freq: Every day | ORAL | Status: DC

## 2015-06-10 ENCOUNTER — Other Ambulatory Visit: Payer: Self-pay | Admitting: Internal Medicine

## 2015-06-10 ENCOUNTER — Other Ambulatory Visit: Payer: Self-pay

## 2015-06-10 MED ORDER — FLUOXETINE HCL 10 MG PO TABS: ORAL_TABLET | ORAL | Status: DC

## 2015-06-10 NOTE — Telephone Encounter (Signed)
prozac rx sent to pharm 

## 2015-08-08 ENCOUNTER — Other Ambulatory Visit: Payer: Self-pay | Admitting: Internal Medicine

## 2015-10-25 ENCOUNTER — Ambulatory Visit (INDEPENDENT_AMBULATORY_CARE_PROVIDER_SITE_OTHER): Payer: Commercial Managed Care - HMO | Admitting: Internal Medicine

## 2015-10-25 ENCOUNTER — Encounter: Payer: Self-pay | Admitting: Internal Medicine

## 2015-10-25 VITALS — BP 122/80 | HR 77 | Ht 68.0 in | Wt 181.0 lb

## 2015-10-25 DIAGNOSIS — N6019 Diffuse cystic mastopathy of unspecified breast: Secondary | ICD-10-CM

## 2015-10-25 DIAGNOSIS — F411 Generalized anxiety disorder: Secondary | ICD-10-CM

## 2015-10-25 DIAGNOSIS — R002 Palpitations: Secondary | ICD-10-CM | POA: Diagnosis not present

## 2015-10-25 MED ORDER — METOPROLOL TARTRATE 25 MG PO TABS: 25.0000 mg | ORAL_TABLET | Freq: Two times a day (BID) | ORAL | Status: DC

## 2015-10-25 MED ORDER — FLUOXETINE HCL 10 MG PO TABS: 10.0000 mg | ORAL_TABLET | Freq: Every day | ORAL | Status: DC

## 2015-10-25 NOTE — Patient Instructions (Addendum)
Please obtain complete lab results from physical 4 months ago for your Epic chart.  Please see Dr. Garwin Brothers for breast exam. Discuss with her whether follow-up mammogram is indicated at [facility] or possibly [facility].  To prevent palpitations or premature beats, avoid stimulants such as decongestants, diet pills, nicotine, or caffeine (coffee, tea, cola, or chocolate) to excess.

## 2015-10-25 NOTE — Progress Notes (Signed)
Pre visit review using our clinic review tool, if applicable. No additional management support is needed unless otherwise documented below in the visit note. 

## 2015-10-25 NOTE — Progress Notes (Signed)
   Subjective:    Patient ID: [name], female    DOB: 10/30/1971, 44 y.o.   MRN: [id number]  HPI The chart indicates she's here for a physical. She states that she had a physical four months ago from Dr. Sharol Roussel, [facility]plement[facility] Alternative Medicine specialist.  She was found to have low vitamin D of "20+" and was prescribed 10,000 units of D 3 times a week.   She was also found to have some "deficiencies in B vitamins " as well as vitamin A . Vitamin A 25,000 units 3 times per week was recommended.  Evaluation for nocturia 3-5 times per night revealed yeast in the urine.   She was told by [facility] 7 months ago that there was a suspicious area for which biopsy was recommended.She did not  pursue that intervention stating she wanted to pursue "more natural" therapies. The [facility] imaging is not in Epic. I assume the changes represent new fibrocystic lesion(s)  A "Thermoscan" performed by Dr. Sharol Roussel 3 months ago revealed questionable lesions described as "warm spots". She's been placed on flaxseed, vitamin A, and iodine topically to the breast.  She was also placed on progesterone days 15-28. She feels this has has helped her chronic dizziness and nausea.  She is overdue to see her Gynecologist, Dr. Garwin Brothers.   Review of Systems Palpitations have responded to the Beta blocker. Generic Prozac helps her "be calm and relaxed". Chest pain,  tachycardia, exertional dyspnea, paroxysmal nocturnal dyspnea, claudication or edema are absent. No unexplained weight loss, abdominal pain, significant dyspepsia, dysphagia, melena, rectal bleeding, or persistently small caliber stools. Dysuria, pyuria, hematuria, frequency, nocturia or polyuria are denied. Change in hair, skin, nails denied. No bowel changes of constipation or diarrhea. No intolerance to heat or cold.     Objective:   Physical Exam  Pertinent or positive findings include: A small punctate deficit with faint hyperpigmentation  is present at the upper chest above the breast tissue. She has minor crepitus of the knees. General appearance :adequately nourished; in no distress.  Eyes: No conjunctival inflammation or scleral icterus is present.  Oral exam:  Lips and gums are healthy appearing.There is no oropharyngeal erythema or exudate noted. Dental hygiene is good.  Heart:  Normal rate and regular rhythm. S1 and S2 normal without gallop, murmur, click, rub or other extra sounds    Lungs:Chest clear to auscultation; no wheezes, rhonchi,rales ,or rubs present.No increased work of breathing.   Abdomen: bowel sounds normal, soft and non-tender without masses, organomegaly or hernias noted.  No guarding or rebound.   Vascular : all pulses equal ; no bruits present.  Skin:Warm & dry.  Intact without suspicious lesions or rashes ; no tenting   Lymphatic: No lymphadenopathy is noted about the head, neck, axilla.   Neuro: Strength, tone & DTRs normal. Oreinted X 3. No abnormal mood.     Assessment & Plan:  #1 questionable breast lesion on mammograms at [facility] 7 months ago; biopsy recommended. Thermoscan apparently revealed some questionable lesions. Dr. Sharol Roussel has monitoring this. I recommend F/U with Dr Garwin Brothers.Repeat mammogram could be considered @ [place] OR @ [facility] as a "second opinion".  #2 Palpitations, controlled  #3 anxiety, controlled with fluoxetine  Plan: See after visit summary.

## 2016-04-29 ENCOUNTER — Other Ambulatory Visit: Payer: Self-pay | Admitting: Internal Medicine

## 2016-05-01 NOTE — Telephone Encounter (Signed)
Left message advising patient to call back to get established/make appt with new pcp---let [name] know when appt is made so that refill for claritin can be sent to pharm

## 2016-05-04 NOTE — Telephone Encounter (Signed)
Pharmacy left msg on triage stating checking on status on pt refill on her Loratadine. Per msg below Marydel left msg on pt vm. She has not made appt sending request back denied...Johny Chess

## 2016-11-15 ENCOUNTER — Other Ambulatory Visit: Payer: Self-pay | Admitting: Internal Medicine

## 2016-12-23 ENCOUNTER — Ambulatory Visit (INDEPENDENT_AMBULATORY_CARE_PROVIDER_SITE_OTHER): Payer: Commercial Managed Care - HMO | Admitting: Internal Medicine

## 2016-12-23 ENCOUNTER — Other Ambulatory Visit (INDEPENDENT_AMBULATORY_CARE_PROVIDER_SITE_OTHER): Payer: Commercial Managed Care - HMO

## 2016-12-23 ENCOUNTER — Encounter: Payer: Self-pay | Admitting: Internal Medicine

## 2016-12-23 VITALS — BP 122/84 | HR 72 | Temp 98.4°F | Resp 16 | Ht 68.0 in | Wt 188.0 lb

## 2016-12-23 DIAGNOSIS — R739 Hyperglycemia, unspecified: Secondary | ICD-10-CM | POA: Diagnosis not present

## 2016-12-23 DIAGNOSIS — E538 Deficiency of other specified B group vitamins: Secondary | ICD-10-CM

## 2016-12-23 DIAGNOSIS — Z0000 Encounter for general adult medical examination without abnormal findings: Secondary | ICD-10-CM

## 2016-12-23 DIAGNOSIS — E559 Vitamin D deficiency, unspecified: Secondary | ICD-10-CM | POA: Diagnosis not present

## 2016-12-23 DIAGNOSIS — E7800 Pure hypercholesterolemia, unspecified: Secondary | ICD-10-CM | POA: Diagnosis not present

## 2016-12-23 DIAGNOSIS — R002 Palpitations: Secondary | ICD-10-CM

## 2016-12-23 DIAGNOSIS — E611 Iron deficiency: Secondary | ICD-10-CM | POA: Diagnosis not present

## 2016-12-23 DIAGNOSIS — N6019 Diffuse cystic mastopathy of unspecified breast: Secondary | ICD-10-CM | POA: Insufficient documentation

## 2016-12-23 DIAGNOSIS — F411 Generalized anxiety disorder: Secondary | ICD-10-CM

## 2016-12-23 DIAGNOSIS — I493 Ventricular premature depolarization: Secondary | ICD-10-CM

## 2016-12-23 DIAGNOSIS — Z1211 Encounter for screening for malignant neoplasm of colon: Secondary | ICD-10-CM

## 2016-12-23 DIAGNOSIS — L509 Urticaria, unspecified: Secondary | ICD-10-CM | POA: Insufficient documentation

## 2016-12-23 LAB — CBC WITH DIFFERENTIAL/PLATELET
Basophils Absolute: 0 10*3/uL (ref 0.0–0.1)
Basophils Relative: 0.4 % (ref 0.0–3.0)
EOS ABS: 0.1 10*3/uL (ref 0.0–0.7)
Eosinophils Relative: 1.7 % (ref 0.0–5.0)
HCT: 40.5 % (ref 36.0–46.0)
Hemoglobin: 13.8 g/dL (ref 12.0–15.0)
Lymphocytes Relative: 21.5 % (ref 12.0–46.0)
Lymphs Abs: 1.3 10*3/uL (ref 0.7–4.0)
MCHC: 34.1 g/dL (ref 30.0–36.0)
MCV: 85.4 fl (ref 78.0–100.0)
Monocytes Absolute: 0.3 10*3/uL (ref 0.1–1.0)
Monocytes Relative: 5.1 % (ref 3.0–12.0)
Neutro Abs: 4.2 10*3/uL (ref 1.4–7.7)
Neutrophils Relative %: 71.3 % (ref 43.0–77.0)
Platelets: 275 10*3/uL (ref 150.0–400.0)
RBC: 4.74 Mil/uL (ref 3.87–5.11)
RDW: 14 % (ref 11.5–15.5)
WBC: 5.9 10*3/uL (ref 4.0–10.5)

## 2016-12-23 LAB — VITAMIN B12: VITAMIN B 12: 579 pg/mL (ref 211–911)

## 2016-12-23 LAB — HEMOGLOBIN A1C: Hgb A1c MFr Bld: 5.6 % (ref 4.6–6.5)

## 2016-12-23 LAB — COMPREHENSIVE METABOLIC PANEL
ALK PHOS: 73 U/L (ref 39–117)
ALT: 12 U/L (ref 0–35)
AST: 13 U/L (ref 0–37)
Albumin: 4.5 g/dL (ref 3.5–5.2)
BILIRUBIN TOTAL: 0.8 mg/dL (ref 0.2–1.2)
BUN: 12 mg/dL (ref 6–23)
CO2: 25 mEq/L (ref 19–32)
Calcium: 9 mg/dL (ref 8.4–10.5)
Chloride: 105 mEq/L (ref 96–112)
Creatinine, Ser: 0.86 mg/dL (ref 0.40–1.20)
GFR: 91.72 mL/min (ref >60.00)
Glucose, Bld: 91 mg/dL (ref 70–99)
Potassium: 4.3 mEq/L (ref 3.5–5.1)
Sodium: 139 mEq/L (ref 135–145)
TOTAL PROTEIN: 7.4 g/dL (ref 6.0–8.3)

## 2016-12-23 LAB — FERRITIN: Ferritin: 25.3 ng/mL (ref 10.0–291.0)

## 2016-12-23 LAB — LIPID PANEL
CHOL/HDL RATIO: 4
Cholesterol: 231 mg/dL — ABNORMAL HIGH (ref 0–200)
HDL: 53.1 mg/dL (ref >39.00)
LDL CALC: 160 mg/dL — AB (ref 0–99)
NONHDL: 178.01
TRIGLYCERIDES: 88 mg/dL (ref 0.0–149.0)
VLDL: 17.6 mg/dL (ref 0.0–40.0)

## 2016-12-23 LAB — TSH: TSH: 1.63 u[IU]/mL (ref 0.35–4.50)

## 2016-12-23 LAB — VITAMIN D 25 HYDROXY (VIT D DEFICIENCY, FRACTURES): VITD: 62.47 ng/mL (ref 30.00–100.00)

## 2016-12-23 LAB — IRON: Iron: 66 ug/dL (ref 42–145)

## 2016-12-23 MED ORDER — VITAMIN D3 250 MCG (10000 UT) PO TABS: 1.0000 | ORAL_TABLET | Freq: Every day | ORAL | Status: AC

## 2016-12-23 MED ORDER — FLAX SEEDS PO POWD: ORAL | Status: DC

## 2016-12-23 MED ORDER — MAGNESIUM GLYCINATE POWD: 0 refills | Status: DC

## 2016-12-23 NOTE — Assessment & Plan Note (Signed)
Check a1c Start regular exercise Low sugar/carb diet

## 2016-12-23 NOTE — Assessment & Plan Note (Signed)
Taking vitamin d daily Check level 

## 2016-12-23 NOTE — Assessment & Plan Note (Signed)
Controlled with metoprolol continue

## 2016-12-23 NOTE — Assessment & Plan Note (Signed)
Check lipid panel  Regular exercise and healthy diet encouraged  

## 2016-12-23 NOTE — Assessment & Plan Note (Signed)
H/o of B12 def Will check level

## 2016-12-23 NOTE — Patient Instructions (Addendum)
Test(s) ordered today. Your results will be released to MyChart (or called to you) after review, usually within 72hours after test completion. If any changes need to be made, you will be notified at that same time.  All other Health Maintenance issues reviewed.   All recommended immunizations and age-appropriate screenings are up-to-date or discussed.  No immunizations administered today.   Medications reviewed and updated.  No changes recommended at this time.   Please followup in one year   Health Maintenance, Female Introduction Adopting a healthy lifestyle and getting preventive care can go a long way to promote health and wellness. Talk with your health care provider about what schedule of regular examinations is right for you. This is a good chance for you to check in with your provider about disease prevention and staying healthy. In between checkups, there are plenty of things you can do on your own. Experts have done a lot of research about which lifestyle changes and preventive measures are most likely to keep you healthy. Ask your health care provider for more information. Weight and diet Eat a healthy diet  Be sure to include plenty of vegetables, fruits, low-fat dairy products, and lean protein.  Do not eat a lot of foods high in solid fats, added sugars, or salt.  Get regular exercise. This is one of the most important things you can do for your health.  Most adults should exercise for at least 150 minutes each week. The exercise should increase your heart rate and make you sweat (moderate-intensity exercise).  Most adults should also do strengthening exercises at least twice a week. This is in addition to the moderate-intensity exercise. Maintain a healthy weight  Body mass index (BMI) is a measurement that can be used to identify possible weight problems. It estimates body fat based on height and weight. Your health care provider can help determine your BMI and help  you achieve or maintain a healthy weight.  For females 20 years of age and older:  A BMI below 18.5 is considered underweight.  A BMI of 18.5 to 24.9 is normal.  A BMI of 25 to 29.9 is considered overweight.  A BMI of 30 and above is considered obese. Watch levels of cholesterol and blood lipids  You should start having your blood tested for lipids and cholesterol at 46 years of age, then have this test every 5 years.  You may need to have your cholesterol levels checked more often if:  Your lipid or cholesterol levels are high.  You are older than 46 years of age.  You are at high risk for heart disease. Cancer screening Lung Cancer  Lung cancer screening is recommended for adults 55-80 years old who are at high risk for lung cancer because of a history of smoking.  A yearly low-dose CT scan of the lungs is recommended for people who:  Currently smoke.  Have quit within the past 15 years.  Have at least a 30-pack-year history of smoking. A pack year is smoking an average of one pack of cigarettes a day for 1 year.  Yearly screening should continue until it has been 15 years since you quit.  Yearly screening should stop if you develop a health problem that would prevent you from having lung cancer treatment. Breast Cancer  Practice breast self-awareness. This means understanding how your breasts normally appear and feel.  It also means doing regular breast self-exams. Let your health care provider know about any changes, no matter how   small.  If you are in your 20s or 30s, you should have a clinical breast exam (CBE) by a health care provider every 1-3 years as part of a regular health exam.  If you are 40 or older, have a CBE every year. Also consider having a breast X-ray (mammogram) every year.  If you have a family history of breast cancer, talk to your health care provider about genetic screening.  If you are at high risk for breast cancer, talk to your health  care provider about having an MRI and a mammogram every year.  Breast cancer gene (BRCA) assessment is recommended for women who have family members with BRCA-related cancers. BRCA-related cancers include:  Breast.  Ovarian.  Tubal.  Peritoneal cancers.  Results of the assessment will determine the need for genetic counseling and BRCA1 and BRCA2 testing. Cervical Cancer  Your health care provider may recommend that you be screened regularly for cancer of the pelvic organs (ovaries, uterus, and vagina). This screening involves a pelvic examination, including checking for microscopic changes to the surface of your cervix (Pap test). You may be encouraged to have this screening done every 3 years, beginning at age 21.  For women ages 30-65, health care providers may recommend pelvic exams and Pap testing every 3 years, or they may recommend the Pap and pelvic exam, combined with testing for human papilloma virus (HPV), every 5 years. Some types of HPV increase your risk of cervical cancer. Testing for HPV may also be done on women of any age with unclear Pap test results.  Other health care providers may not recommend any screening for nonpregnant women who are considered low risk for pelvic cancer and who do not have symptoms. Ask your health care provider if a screening pelvic exam is right for you.  If you have had past treatment for cervical cancer or a condition that could lead to cancer, you need Pap tests and screening for cancer for at least 20 years after your treatment. If Pap tests have been discontinued, your risk factors (such as having a new sexual partner) need to be reassessed to determine if screening should resume. Some women have medical problems that increase the chance of getting cervical cancer. In these cases, your health care provider may recommend more frequent screening and Pap tests. Colorectal Cancer  This type of cancer can be detected and often prevented.  Routine  colorectal cancer screening usually begins at 46 years of age and continues through 46 years of age.  Your health care provider may recommend screening at an earlier age if you have risk factors for colon cancer.  Your health care provider may also recommend using home test kits to check for hidden blood in the stool.  A small camera at the end of a tube can be used to examine your colon directly (sigmoidoscopy or colonoscopy). This is done to check for the earliest forms of colorectal cancer.  Routine screening usually begins at age 50.  Direct examination of the colon should be repeated every 5-10 years through 46 years of age. However, you may need to be screened more often if early forms of precancerous polyps or small growths are found. Skin Cancer  Check your skin from head to toe regularly.  Tell your health care provider about any new moles or changes in moles, especially if there is a change in a mole's shape or color.  Also tell your health care provider if you have a mole that is   larger than the size of a pencil eraser.  Always use sunscreen. Apply sunscreen liberally and repeatedly throughout the day.  Protect yourself by wearing long sleeves, pants, a wide-brimmed hat, and sunglasses whenever you are outside. Heart disease, diabetes, and high blood pressure  High blood pressure causes heart disease and increases the risk of stroke. High blood pressure is more likely to develop in:  People who have blood pressure in the high end of the normal range (130-139/85-89 mm Hg).  People who are overweight or obese.  People who are African American.  If you are 18-39 years of age, have your blood pressure checked every 3-5 years. If you are 40 years of age or older, have your blood pressure checked every year. You should have your blood pressure measured twice-once when you are at a hospital or clinic, and once when you are not at a hospital or clinic. Record the average of the two  measurements. To check your blood pressure when you are not at a hospital or clinic, you can use:  An automated blood pressure machine at a pharmacy.  A home blood pressure monitor.  If you are between 55 years and 79 years old, ask your health care provider if you should take aspirin to prevent strokes.  Have regular diabetes screenings. This involves taking a blood sample to check your fasting blood sugar level.  If you are at a normal weight and have a low risk for diabetes, have this test once every three years after 45 years of age.  If you are overweight and have a high risk for diabetes, consider being tested at a younger age or more often. Preventing infection Hepatitis B  If you have a higher risk for hepatitis B, you should be screened for this virus. You are considered at high risk for hepatitis B if:  You were born in a country where hepatitis B is common. Ask your health care provider which countries are considered high risk.  Your parents were born in a high-risk country, and you have not been immunized against hepatitis B (hepatitis B vaccine).  You have HIV or AIDS.  You use needles to inject street drugs.  You live with someone who has hepatitis B.  You have had sex with someone who has hepatitis B.  You get hemodialysis treatment.  You take certain medicines for conditions, including cancer, organ transplantation, and autoimmune conditions. Hepatitis C  Blood testing is recommended for:  Everyone born from 1945 through 1965.  Anyone with known risk factors for hepatitis C. Sexually transmitted infections (STIs)  You should be screened for sexually transmitted infections (STIs) including gonorrhea and chlamydia if:  You are sexually active and are younger than 46 years of age.  You are older than 46 years of age and your health care provider tells you that you are at risk for this type of infection.  Your sexual activity has changed since you were last  screened and you are at an increased risk for chlamydia or gonorrhea. Ask your health care provider if you are at risk.  If you do not have HIV, but are at risk, it may be recommended that you take a prescription medicine daily to prevent HIV infection. This is called pre-exposure prophylaxis (PrEP). You are considered at risk if:  You are sexually active and do not regularly use condoms or know the HIV status of your partner(s).  You take drugs by injection.  You are sexually active with a partner   who has HIV. Talk with your health care provider about whether you are at high risk of being infected with HIV. If you choose to begin PrEP, you should first be tested for HIV. You should then be tested every 3 months for as long as you are taking PrEP. Pregnancy  If you are premenopausal and you may become pregnant, ask your health care provider about preconception counseling.  If you may become pregnant, take 400 to 800 micrograms (mcg) of folic acid every day.  If you want to prevent pregnancy, talk to your health care provider about birth control (contraception). Osteoporosis and menopause  Osteoporosis is a disease in which the bones lose minerals and strength with aging. This can result in serious bone fractures. Your risk for osteoporosis can be identified using a bone density scan.  If you are 65 years of age or older, or if you are at risk for osteoporosis and fractures, ask your health care provider if you should be screened.  Ask your health care provider whether you should take a calcium or vitamin D supplement to lower your risk for osteoporosis.  Menopause may have certain physical symptoms and risks.  Hormone replacement therapy may reduce some of these symptoms and risks. Talk to your health care provider about whether hormone replacement therapy is right for you. Follow these instructions at home:  Schedule regular health, dental, and eye exams.  Stay current with your  immunizations.  Do not use any tobacco products including cigarettes, chewing tobacco, or electronic cigarettes.  If you are pregnant, do not drink alcohol.  If you are breastfeeding, limit how much and how often you drink alcohol.  Limit alcohol intake to no more than 1 drink per day for nonpregnant women. One drink equals 12 ounces of beer, 5 ounces of wine, or 1 ounces of hard liquor.  Do not use street drugs.  Do not share needles.  Ask your health care provider for help if you need support or information about quitting drugs.  Tell your health care provider if you often feel depressed.  Tell your health care provider if you have ever been abused or do not feel safe at home. This information is not intended to replace advice given to you by your health care provider. Make sure you discuss any questions you have with your health care provider. Document Released: 06/22/2011 Document Revised: 05/14/2016 Document Reviewed: 09/10/2015  2017 Elsevier    

## 2016-12-23 NOTE — Assessment & Plan Note (Signed)
Check levels 

## 2016-12-23 NOTE — Assessment & Plan Note (Addendum)
Taking metoprolol daily Controlling palps Will continue at current dose

## 2016-12-23 NOTE — Assessment & Plan Note (Addendum)
intermittent Cause unknown Does not take anything Can refer to derm or allergy is she wishes

## 2016-12-23 NOTE — Assessment & Plan Note (Signed)
Take prozac daily - takes 1/2 pill to 1 pill daily Stable, controlled Happy with medication Will continue

## 2016-12-23 NOTE — Progress Notes (Signed)
Pre visit review using our clinic review tool, if applicable. No additional management support is needed unless otherwise documented below in the visit note. 

## 2016-12-23 NOTE — Progress Notes (Signed)
Subjective:    Patient ID: [name], female    DOB: 22-Jul-1971, 46 y.o.   MRN: [id number]  HPI She is here to establish with a new pcp.  She is here for a physical exam.   She was following with Dr Sharol Roussel until she closed.  She is due to see her gyn.  She is taking her supplements.  She has been taking Prometrium that Dr Sharol Roussel started her on and it has helped her feel better.  She has less symptoms related to her menses.    She wants to try to everything as natural as possible.  She has had a tomography of her breasts but does not want to have a mammogram.    Medications and allergies reviewed with patient and updated if appropriate.  Patient Active Problem List   Diagnosis Date Noted  . Hyperglycemia 12/23/2016  . Seasonal allergic rhinitis 06/05/2013  . Mitral regurgitation 06/02/2011  . Pre-syncope 06/02/2011  . Hyperlipidemia 11/14/2008  . PALPITATIONS 01/25/2008  . GILBERT'S SYNDROME 06/22/2007  . Anxiety state 06/22/2007  . PREMATURE VENTRICULAR CONTRACTIONS 06/22/2007    Current Outpatient Prescriptions on File Prior to Visit  Medication Sig Dispense Refill  . aspirin 81 MG tablet Take 81 mg by mouth. 1 by mouth 2 x weekly    . FLUoxetine (PROZAC) 10 MG tablet Take 1-2 tablets (10-20 mg total) by mouth daily. 60 tablet 3  . metoprolol tartrate (LOPRESSOR) 25 MG tablet Take 1 tablet (25 mg total) by mouth 2 (two) times daily. 60 tablet 5  . Multiple Vitamin (MULTIVITAMIN) tablet Take 1 tablet by mouth daily.      . Omega-3 Fatty Acids (FISH OIL PO) Take by mouth daily.      . ondansetron (ZOFRAN) 8 MG tablet Take 8 mg by mouth every 8 (eight) hours as needed for nausea or vomiting.     . progesterone (PROMETRIUM) 100 MG capsule TAKE 1-2 CAPSULES BY MOUTH DAILY AT BEDTIME ON DAYS 15-28 OF CYCLE  0   No current facility-administered medications on file prior to visit.     Past Medical History:  Diagnosis Date  . Anxiety   . Gilbert syndrome   . Herpes  exposure    Type 1 & 2 ; Dr Garwin Brothers, [place]  . Hyperlipidemia   . Hypertension   . Premature ventricular contractions     Past Surgical History:  Procedure Laterality Date  . CESAREAN SECTION     X 2, Dr Garwin Brothers  . TUBAL LIGATION    . WISDOM TOOTH EXTRACTION      Social History   Social History  . Marital status: Married    Spouse name: N/A  . Number of children: N/A  . Years of education: N/A   Social History Main Topics  . Smoking status: Never Smoker  . Smokeless tobacco: Not on file  . Alcohol use No  . Drug use: No  . Sexual activity: Not on file   Other Topics Concern  . Not on file   Social History Narrative  . No narrative on file    Family History  Problem Relation Age of Onset  . Asthma Mother   . Stroke Father 53  . Colon cancer Father   . Diabetes Sister   . Hypertension Brother   . Transient ischemic attack Brother 41  . Asthma Maternal Grandmother     died @ 43 of status asthmaticus  . Heart disease Neg Hx  Review of Systems  Constitutional: Negative for chills, fatigue and fever.  Eyes: Negative for visual disturbance.  Respiratory: Negative for cough, shortness of breath and wheezing.   Cardiovascular: Positive for palpitations (occ). Negative for chest pain and leg swelling.  Gastrointestinal: Negative for abdominal pain, blood in stool, constipation, diarrhea and nausea.       No gerd  Genitourinary: Negative for dysuria and hematuria.  Musculoskeletal: Positive for back pain (lower back from lifting son).  Skin:       Hives intermittent  Neurological: Positive for dizziness (when stressed), light-headedness (around menses) and headaches (occ).  Psychiatric/Behavioral: The patient is nervous/anxious.        Objective:   Vitals:   12/23/16 0836  BP: 122/84  Pulse: 72  Resp: 16  Temp: 98.4 F (36.9 C)   Filed Weights   12/23/16 0836  Weight: 188 lb (85.3 kg)   Body mass index is 28.59 kg/m.  Wt Readings from Last 3  Encounters:  12/23/16 188 lb (85.3 kg)  10/25/15 181 lb (82.1 kg)  11/26/14 178 lb 6 oz (80.9 kg)     Physical Exam Constitutional: She appears well-developed and well-nourished. No distress.  HENT:  Head: Normocephalic and atraumatic.  Right Ear: External ear normal. Normal ear canal and TM Left Ear: External ear normal.  Normal ear canal and TM Mouth/Throat: Oropharynx is clear and moist.  Eyes: Conjunctivae and EOM are normal.  Neck: Neck supple. No tracheal deviation present. No thyromegaly present.  No carotid bruit  Cardiovascular: Normal rate, regular rhythm and normal heart sounds.   No murmur heard.  No edema. Pulmonary/Chest: Effort normal and breath sounds normal. No respiratory distress. She has no wheezes. She has no rales.  Breast: deferred to Gyn Abdominal: Soft. She exhibits no distension. There is no tenderness.  Lymphadenopathy: She has no cervical adenopathy.  Skin: Skin is warm and dry. She is not diaphoretic.  Psychiatric: She has a normal mood and affect. Her behavior is normal.         Assessment & Plan:   Physical exam: Screening blood work  ordered Immunizations discussed td - wants to think about  Mammogram - does not want a mammogram Gyn  - due - will schedule Eye exams - having increased difficulty reading - will schedule an eye exam EKG - done 2015 - no indication to do one today Exercise  - none - will try to start regular exercise Weight - will try to work on weight loss Skin hives  -  intermittent Substance abuse  - none  She did ask if I would be able to prescribe her prometrium and I advised her I would not.  She will schedule an appt with Dr Garwin Brothers and will discuss with her.   See Problem List for Assessment and Plan of chronic medical problems.   FU annually, sooner if needed

## 2016-12-24 ENCOUNTER — Encounter: Payer: Self-pay | Admitting: Internal Medicine

## 2017-01-22 ENCOUNTER — Encounter: Payer: Self-pay | Admitting: Internal Medicine

## 2017-05-31 ENCOUNTER — Telehealth: Payer: Self-pay | Admitting: Internal Medicine

## 2017-06-09 MED ORDER — FLUOXETINE HCL 10 MG PO TABS: 10.0000 mg | ORAL_TABLET | Freq: Every day | ORAL | 3 refills | Status: DC

## 2017-06-09 NOTE — Telephone Encounter (Addendum)
Patient is requesting refill on this medication.  Pharmacy sent to incorrect MD.  Patient states does not need follow up call if med can be sent.

## 2017-06-09 NOTE — Addendum Note (Signed)
Addended by: Terence Lux B on: 06/09/2017 11:16 AM   Modules accepted: Orders

## 2017-06-09 NOTE — Telephone Encounter (Signed)
Sent!

## 2017-09-22 ENCOUNTER — Telehealth: Payer: Self-pay | Admitting: General Practice

## 2017-09-22 NOTE — Telephone Encounter (Signed)
Left message for patient to call back to schedule appt.

## 2017-09-22 NOTE — Telephone Encounter (Signed)
Ok with me 

## 2017-09-22 NOTE — Telephone Encounter (Signed)
Pt called stating that she had jsut moved to the [place] area and was wanting to transfer care from Dr. Quay Burow to [facility]. Ok to transfer?

## 2017-09-22 NOTE — Telephone Encounter (Signed)
Yes, ok with me 

## 2017-10-01 ENCOUNTER — Ambulatory Visit (INDEPENDENT_AMBULATORY_CARE_PROVIDER_SITE_OTHER): Payer: 59 | Admitting: Physician Assistant

## 2017-10-01 ENCOUNTER — Encounter: Payer: Self-pay | Admitting: Physician Assistant

## 2017-10-01 VITALS — BP 124/90 | HR 82 | Temp 98.7°F | Resp 14 | Ht 68.0 in | Wt 181.0 lb

## 2017-10-01 DIAGNOSIS — G245 Blepharospasm: Secondary | ICD-10-CM

## 2017-10-01 DIAGNOSIS — Z23 Encounter for immunization: Secondary | ICD-10-CM

## 2017-10-01 DIAGNOSIS — F411 Generalized anxiety disorder: Secondary | ICD-10-CM

## 2017-10-01 DIAGNOSIS — I10 Essential (primary) hypertension: Secondary | ICD-10-CM

## 2017-10-01 LAB — COMPREHENSIVE METABOLIC PANEL
ALBUMIN: 4.5 g/dL (ref 3.5–5.2)
ALT: 15 U/L (ref 0–35)
AST: 16 U/L (ref 0–37)
Alkaline Phosphatase: 74 U/L (ref 39–117)
BUN: 8 mg/dL (ref 6–23)
CALCIUM: 8.7 mg/dL (ref 8.4–10.5)
CO2: 28 mEq/L (ref 19–32)
Chloride: 104 mEq/L (ref 96–112)
Creatinine, Ser: 0.76 mg/dL (ref 0.40–1.20)
GFR: 105.42 mL/min (ref >60.00)
Glucose, Bld: 88 mg/dL (ref 70–99)
POTASSIUM: 4.1 meq/L (ref 3.5–5.1)
Sodium: 140 mEq/L (ref 135–145)
Total Bilirubin: 0.8 mg/dL (ref 0.2–1.2)
Total Protein: 7.1 g/dL (ref 6.0–8.3)

## 2017-10-01 LAB — IRON: IRON: 50 ug/dL (ref 42–145)

## 2017-10-01 MED ORDER — METOPROLOL SUCCINATE ER 25 MG PO TB24: 12.5000 mg | ORAL_TABLET | Freq: Every day | ORAL | 0 refills | Status: DC

## 2017-10-01 NOTE — Patient Instructions (Signed)
Please go to the lab today for blood work.  I will call you with your results. We will alter treatment regimen(s) if indicated by your results.   Take 1 tablet of the Fluoxetine (10 mg) daily. We may increase this in the near future because I believe anxiety is contributing to all symptoms.  BP is elevated today.  Will attempt trial of low-dose long-acting Metoprolol to see if you tolerate better without side effect.  Keep close eye on BP. Follow-up with me in 2 weeks for reassessment. Return sooner if needed.

## 2017-10-01 NOTE — Progress Notes (Signed)
Patient presents to clinic today as a transfer of care from Dr. Quay Burow at our [place] office.   Chronic Issues: Hypertension -- Previously on Metoprolol 25 mg BID. This was for Hypertension and PVCs. Endorses weaning off of Metoprolol short-acting  due to lightheadedness with complete resolution. Since then has noted palpitations and increased BP.  Anxiety -- Endorses fluoxetine helps somewhat. Is supposed to take 10 mg daily but only take 1/2 tablet. Denies noted side effects with higher doses.  Health Maintenance: Immunizations -- Due for repeat TDaP. Agrees to update today. Mammogram -- Declines. PAP -- Followed by GYN.   Past Medical History:  Diagnosis Date  . Anxiety   . Gilbert syndrome   . Herpes exposure    Type 1 & 2 ; Dr Garwin Brothers, [place]  . Hyperlipidemia   . Hypertension   . Premature ventricular contractions     Past Surgical History:  Procedure Laterality Date  . CESAREAN SECTION     X 2, Dr Garwin Brothers  . TUBAL LIGATION    . WISDOM TOOTH EXTRACTION      Current Outpatient Prescriptions on File Prior to Visit  Medication Sig Dispense Refill  . aspirin 81 MG tablet Take 81 mg by mouth daily. 1 by mouth 2 x weekly    . Cholecalciferol (VITAMIN D3) 10000 units TABS Take 1 tablet by mouth daily.    Marland Kitchen FLUoxetine (PROZAC) 10 MG tablet Take 1-2 tablets (10-20 mg total) by mouth daily. (Patient taking differently: Take 0.5 mg tablet by mouth daily) 60 tablet 3  . Magnesium Glycinate POWD Takes daily  0  . Multiple Vitamin (MULTIVITAMIN) tablet Take 1 tablet by mouth daily.      . progesterone (PROMETRIUM) 100 MG capsule TAKE 1-2 CAPSULES BY MOUTH DAILY AT BEDTIME ON DAYS 15-28 OF CYCLE  0  . ondansetron (ZOFRAN) 8 MG tablet Take 8 mg by mouth every 8 (eight) hours as needed for nausea or vomiting.      No current facility-administered medications on file prior to visit.     Allergies  Allergen Reactions  . Cephalexin      03/2012 Dyspnea  . Penicillins     Unknown  reaction as child    Family History  Problem Relation Age of Onset  . Asthma Mother   . Stroke Father 48  . Colon cancer Father   . Diabetes Sister   . Hypertension Brother   . Transient ischemic attack Brother 41  . Asthma Maternal Grandmother        died @ 6 of status asthmaticus  . Heart disease Neg Hx     Social History   Social History  . Marital status: Married    Spouse name: N/A  . Number of children: N/A  . Years of education: N/A   Occupational History  . Not on file.   Social History Main Topics  . Smoking status: Never Smoker  . Smokeless tobacco: Never Used  . Alcohol use No  . Drug use: No  . Sexual activity: Not on file   Other Topics Concern  . Not on file   Social History Narrative  . No narrative on file   Review of Systems  Constitutional: Negative for chills and fever.  HENT: Negative for hearing loss.   Eyes: Negative for blurred vision and double vision.  Respiratory: Negative for shortness of breath.   Cardiovascular: Positive for palpitations. Negative for chest pain, orthopnea and claudication.  Psychiatric/Behavioral: Negative for depression, hallucinations, memory  loss, substance abuse and suicidal ideas. The patient is nervous/anxious. The patient does not have insomnia.    BP 124/90   Pulse 82   Temp 98.7 F (37.1 C) (Oral)   Resp 14   Ht _0  (1.727 m)   Wt 181 lb (82.1 kg)   SpO2 98%   BMI 27.52 kg/m   Physical Exam  Constitutional: She is oriented to person, place, and time and well-developed, well-nourished, and in no distress.  HENT:  Head: Normocephalic and atraumatic.  Eyes: Conjunctivae are normal.  Neck: Neck supple.  Cardiovascular: Normal rate, regular rhythm, normal heart sounds and intact distal pulses.   Pulmonary/Chest: Effort normal and breath sounds normal. No respiratory distress. She has no wheezes. She has no rales. She exhibits no tenderness.  Neurological: She is alert and oriented to person,  place, and time.  Skin: Skin is warm and dry. No rash noted.  Psychiatric: Affect normal.  Vitals reviewed.  Assessment/Plan: 1. Hypertension, unspecified type BP slightly above goal. Home BP in 150s. Will start Toprol XL at 12.5 mg daily to see how this does. Follow-up scheduled.  - Comp Met (CMET)  2. Eye twitch Suspect anxiety-related but will check lab studies today to further assess.  - Comp Met (CMET) - PTH, intact (no Ca) - Iron  3. Need for Tdap vaccination TDaP updated.  - Tdap vaccine greater than or equal to 7yo IM  4. Anxiety state Increase Fluoxetine to 10 mg daily. Follow-up scheduled.    Leeanne Rio, PA-C

## 2017-10-01 NOTE — Progress Notes (Signed)
Pre visit review using our clinic review tool, if applicable. No additional management support is needed unless otherwise documented below in the visit note. 

## 2017-10-04 ENCOUNTER — Encounter: Payer: Self-pay | Admitting: Emergency Medicine

## 2017-10-04 ENCOUNTER — Encounter: Payer: Self-pay | Admitting: Physician Assistant

## 2017-10-04 LAB — PARATHYROID HORMONE, INTACT (NO CA): PTH: 33 pg/mL (ref 14–64)

## 2017-10-04 LAB — TIQ-NTM

## 2017-10-05 ENCOUNTER — Telehealth: Payer: Self-pay | Admitting: Emergency Medicine

## 2017-10-05 ENCOUNTER — Encounter: Payer: Self-pay | Admitting: Emergency Medicine

## 2017-10-05 NOTE — Telephone Encounter (Signed)
If this symptom has continued without resolution, I feel it needs further assessment. I would actually recommend Neurology as there were no mentioned changes in vision. It is one thing if this is an off and on thing. If the spasms are just continual, I want a Neurologist to take a look.

## 2017-10-05 NOTE — Telephone Encounter (Signed)
Called patient about any questions about lab results. Lab results were normal and would discuss any further at follow up appointment

## 2017-10-05 NOTE — Telephone Encounter (Signed)
My chart message sent to patient about recommendations.

## 2017-10-05 NOTE — Telephone Encounter (Signed)
Spoke with patient and advised all labs were normal. She was agreeable with the lab results. She is still having the right eye twitching. Any other recommendations? Would it be an good idea for an Eye exam Please advise

## 2017-10-15 ENCOUNTER — Ambulatory Visit (INDEPENDENT_AMBULATORY_CARE_PROVIDER_SITE_OTHER): Payer: 59 | Admitting: Physician Assistant

## 2017-10-15 ENCOUNTER — Encounter: Payer: Self-pay | Admitting: Physician Assistant

## 2017-10-15 VITALS — BP 118/86 | HR 73 | Temp 98.7°F | Resp 14 | Ht 68.0 in | Wt 180.0 lb

## 2017-10-15 DIAGNOSIS — I10 Essential (primary) hypertension: Secondary | ICD-10-CM

## 2017-10-15 DIAGNOSIS — F411 Generalized anxiety disorder: Secondary | ICD-10-CM | POA: Diagnosis not present

## 2017-10-15 DIAGNOSIS — L309 Dermatitis, unspecified: Secondary | ICD-10-CM | POA: Diagnosis not present

## 2017-10-15 MED ORDER — TRIAMCINOLONE ACETONIDE 0.5 % EX OINT
1.0000 | TOPICAL_OINTMENT | Freq: Two times a day (BID) | CUTANEOUS | 0 refills | Status: DC
Start: 2017-10-15 — End: 2018-01-21

## 2017-10-15 NOTE — Assessment & Plan Note (Signed)
Mild irritant dermatitis versus hormone related rash. Discussed supportive measures. Triamcinolone BID. Follow-up if not resolving.

## 2017-10-15 NOTE — Progress Notes (Signed)
Pre visit review using our clinic review tool, if applicable. No additional management support is needed unless otherwise documented below in the visit note. 

## 2017-10-15 NOTE — Patient Instructions (Signed)
Blood pressure looks much better. Please continue medications as directed. I am glad the Fluoxetine is helping anxiety.  The rash seems like a mild dermatitis versus hormonal-related rash. Use the steroid cream given twice daily to help resolution.  This is not consistent with a medication rash.   If not improving, please call me.  Otherwise follow-up as scheduled in 3 months.

## 2017-10-15 NOTE — Assessment & Plan Note (Signed)
BP improved today. Asymptomatic. Continue current regimen.

## 2017-10-15 NOTE — Assessment & Plan Note (Signed)
Much improved. Continue low-dose Prozac.

## 2017-10-15 NOTE — Progress Notes (Signed)
Patient presents to clinic today for follow-up of hypertension and anxiety. At last visit, patient was restarted on Toprol XL 12.5 mg daily and Fluoxetine 10 mg daily. Is taking both as directed and tolerating well. Notes improvement in mood. Patient denies chest pain, palpitations, lightheadedness, dizziness, vision changes or frequent headaches.  Notes rash of lower back that is itchy. Denies change to soaps, lotions or detergent. Denies contact with similar symptoms. Has not applied anything to the area.   Past Medical History:  Diagnosis Date  . Anxiety   . Gilbert syndrome   . Herpes exposure    Type 1 & 2 ; Dr Garwin Brothers, [place]  . Hyperlipidemia   . Hypertension   . Premature ventricular contractions     Current Outpatient Prescriptions on File Prior to Visit  Medication Sig Dispense Refill  . aspirin 81 MG tablet Take 81 mg by mouth daily. 1 by mouth 2 x weekly    . Cholecalciferol (VITAMIN D3) 10000 units TABS Take 1 tablet by mouth daily.    Marland Kitchen FLUoxetine (PROZAC) 10 MG tablet Take 1-2 tablets (10-20 mg total) by mouth daily. (Patient taking differently: Take 10 mg by mouth daily. Take 0.5 mg tablet by mouth daily) 60 tablet 3  . Magnesium Glycinate POWD Takes daily  0  . metoprolol succinate (TOPROL XL) 25 MG 24 hr tablet Take 0.5 tablets (12.5 mg total) by mouth daily. 30 tablet 0  . Multiple Vitamin (MULTIVITAMIN) tablet Take 1 tablet by mouth daily.      . Omega-3 Fatty Acids (OMEGA-3 FISH OIL) 1200 MG CAPS Take 1 capsule by mouth daily.    . ondansetron (ZOFRAN) 8 MG tablet Take 8 mg by mouth every 8 (eight) hours as needed for nausea or vomiting.     . progesterone (PROMETRIUM) 100 MG capsule TAKE 1-2 CAPSULES BY MOUTH DAILY AT BEDTIME ON DAYS 15-28 OF CYCLE  0  . Serotonin HCl POWD Take 1 capsule by mouth at bedtime.     No current facility-administered medications on file prior to visit.     Allergies  Allergen Reactions  . Cephalexin      03/2012 Dyspnea  .  Penicillins     Unknown reaction as child    Family History  Problem Relation Age of Onset  . Asthma Mother   . Stroke Father 37  . Colon cancer Father   . Diabetes Sister   . Hypertension Brother   . Transient ischemic attack Brother 41  . Asthma Maternal Grandmother        died @ 49 of status asthmaticus  . Heart disease Neg Hx     Social History   Social History  . Marital status: Married    Spouse name: N/A  . Number of children: N/A  . Years of education: N/A   Social History Main Topics  . Smoking status: Never Smoker  . Smokeless tobacco: Never Used  . Alcohol use No  . Drug use: No  . Sexual activity: Not Asked   Other Topics Concern  . None   Social History Narrative  . None   Review of Systems - See HPI.  All other ROS are negative.  BP 118/86   Pulse 73   Temp 98.7 F (37.1 C) (Oral)   Resp 14   Ht 5' 8"  (1.727 m)   Wt 180 lb (81.6 kg)   SpO2 99%   BMI 27.37 kg/m   Physical Exam  Constitutional: She is oriented to  person, place, and time and well-developed, well-nourished, and in no distress.  HENT:  Head: Normocephalic and atraumatic.  Eyes: Conjunctivae are normal.  Neck: Neck supple.  Cardiovascular: Normal rate, regular rhythm, normal heart sounds and intact distal pulses.   Pulmonary/Chest: Effort normal and breath sounds normal. No respiratory distress. She has no wheezes. She has no rales. She exhibits no tenderness.  Neurological: She is alert and oriented to person, place, and time.  Skin: Skin is warm and dry.     Psychiatric: Affect normal.  Vitals reviewed.   Recent Results (from the past 2160 hour(s))  Comp Met (CMET)     Status: None   Collection Time: 10/01/17 10:10 AM  Result Value Ref Range   Sodium 140 135 - 145 mEq/L   Potassium 4.1 3.5 - 5.1 mEq/L   Chloride 104 96 - 112 mEq/L   CO2 28 19 - 32 mEq/L   Glucose, Bld 88 70 - 99 mg/dL   BUN 8 6 - 23 mg/dL   Creatinine, Ser 0.76 0.40 - 1.20 mg/dL   Total Bilirubin  0.8 0.2 - 1.2 mg/dL   Alkaline Phosphatase 74 39 - 117 U/L   AST 16 0 - 37 U/L   ALT 15 0 - 35 U/L   Total Protein 7.1 6.0 - 8.3 g/dL   Albumin 4.5 3.5 - 5.2 g/dL   Calcium 8.7 8.4 - 10.5 mg/dL   GFR 105.42 >60.00 mL/min  PTH, intact (no Ca)     Status: None   Collection Time: 10/01/17 10:10 AM  Result Value Ref Range   PTH 33 14 - 64 pg/mL    Comment: . Interpretive Guide    Intact PTH           Calcium ------------------    ----------           ------- Normal Parathyroid    Normal               Normal Hypoparathyroidism    Low or Low Normal    Low Hyperparathyroidism    Primary            Normal or High       High    Secondary          High                 Normal or Low    Tertiary           High                 High Non-Parathyroid    Hypercalcemia      Low or Low Normal    High .   Iron     Status: None   Collection Time: 10/01/17 10:10 AM  Result Value Ref Range   Iron 50 42 - 145 ug/dL  TIQ-NTM     Status: None   Collection Time: 10/01/17 10:10 AM  Result Value Ref Range   QUESTION/PROBLEM:      Comment: . No test(s) are indicated on the requisition for the following specimen(s). .    SPECIMEN(S) RECEIVED: SS,FS     Comment: To prevent further delays in testing, please contact us immediately at [phone] order to  resolve this questionable order. Fax completed form to [phone].     Assessment/Plan: Hypertension BP improved today. Asymptomatic. Continue current regimen.   Dermatitis Mild irritant dermatitis versus hormone related rash. Discussed supportive measures. Triamcinolone BID. Follow-up if not resolving.  Anxiety state Much improved. Continue low-dose Prozac.    Leeanne Rio, PA-C

## 2017-10-19 DIAGNOSIS — H40013 Open angle with borderline findings, low risk, bilateral: Secondary | ICD-10-CM | POA: Diagnosis not present

## 2017-10-19 DIAGNOSIS — H2513 Age-related nuclear cataract, bilateral: Secondary | ICD-10-CM | POA: Diagnosis not present

## 2017-10-19 DIAGNOSIS — H04123 Dry eye syndrome of bilateral lacrimal glands: Secondary | ICD-10-CM | POA: Diagnosis not present

## 2017-12-24 ENCOUNTER — Other Ambulatory Visit: Payer: Self-pay | Admitting: Physician Assistant

## 2017-12-29 ENCOUNTER — Other Ambulatory Visit: Payer: Self-pay | Admitting: Physician Assistant

## 2017-12-29 NOTE — Telephone Encounter (Signed)
Copied from [place] [id number]. Topic: Quick Communication - Rx Refill/Question >> Dec 29, 2017 11:17 AM Percell Belt A wrote: Medication:  triamcinolone ointment (KENALOG) 0.5 % [[id number]]  and metoprolol succinate (TOPROL XL) 25 MG 24 hr tablet [[id number]]   Has the patient contacted their pharmacy? Yes    (Agent: If no, request that the patient contact the pharmacy for the refill.) pt called in and is need refill on BP meds and Ointment for her Hives    Preferred [facility] on [place]: Please be advised that RX refills may take up to 3 business days. We ask that you follow-up with your pharmacy.

## 2018-01-20 NOTE — Progress Notes (Signed)
Patient presents to clinic today for annual exam.  Patient is fasting for labs.  Acute Concerns: Patient endorses bumps on the left side of her jaw present for several years. Non-painful. Has history of keloid formation and cystic acne.  Chronic Issues: Symptomatic PVCs/Hypertension -- Currently on a regimen of Metoprolol Succinate. Is taking as directed. Patient denies chest pain, palpitations, lightheadedness, dizziness, vision changes or frequent headaches.  BP Readings from Last 3 Encounters:  01/21/18 116/82  10/15/17 118/86  10/01/17 124/90   Health Maintenance: Immunizations -- Tetanus up-to-date. Declines flu.  Mammogram -- Last 2 years ago. Followed by GYN. PAP -- 2-3 year since last. Has follow-up scheduled with GYN.  Past Medical History:  Diagnosis Date  . Anxiety   . Gilbert syndrome   . Herpes exposure    Type 1 & 2 ; Dr Garwin Brothers, [place]  . Hyperlipidemia   . Hypertension   . Premature ventricular contractions     Past Surgical History:  Procedure Laterality Date  . CESAREAN SECTION     X 2, Dr Garwin Brothers  . TUBAL LIGATION    . WISDOM TOOTH EXTRACTION      Current Outpatient Medications on File Prior to Visit  Medication Sig Dispense Refill  . aspirin 81 MG tablet Take 81 mg by mouth daily. 1 by mouth 2 x weekly    . Cholecalciferol (VITAMIN D3) 10000 units TABS Take 1 tablet by mouth daily.    Marland Kitchen FLUoxetine (PROZAC) 10 MG tablet Take 1-2 tablets (10-20 mg total) by mouth daily. (Patient taking differently: Take 5 mg by mouth at bedtime. ) 60 tablet 3  . Magnesium Glycinate POWD Takes daily  0  . metoprolol succinate (TOPROL-XL) 25 MG 24 hr tablet TAKE ONE-HALF(1/2) TABLET BY MOUTH DAILY 90 tablet 1  . Multiple Vitamin (MULTIVITAMIN) tablet Take 1 tablet by mouth daily.      . ondansetron (ZOFRAN) 8 MG tablet Take 8 mg by mouth every 8 (eight) hours as needed for nausea or vomiting.     . progesterone (PROMETRIUM) 100 MG capsule TAKE 1-2 CAPSULES BY MOUTH  DAILY AT BEDTIME ON DAYS 15-28 OF CYCLE  0  . Serotonin HCl POWD Take 1 capsule by mouth at bedtime.     No current facility-administered medications on file prior to visit.     Allergies  Allergen Reactions  . Cephalexin      03/2012 Dyspnea  . Penicillins     Unknown reaction as child    Family History  Problem Relation Age of Onset  . Asthma Mother   . Stroke Father 83  . Colon cancer Father   . Diabetes Sister   . Hypertension Brother   . Transient ischemic attack Brother 41  . Asthma Maternal Grandmother        died @ 93 of status asthmaticus  . Heart disease Neg Hx     Social History   Socioeconomic History  . Marital status: Married    Spouse name: Not on file  . Number of children: Not on file  . Years of education: Not on file  . Highest education level: Not on file  Social Needs  . Financial resource strain: Not on file  . Food insecurity - worry: Not on file  . Food insecurity - inability: Not on file  . Transportation needs - medical: Not on file  . Transportation needs - non-medical: Not on file  Occupational History  . Not on file  Tobacco Use  .  Smoking status: Never Smoker  . Smokeless tobacco: Never Used  Substance and Sexual Activity  . Alcohol use: No  . Drug use: No  . Sexual activity: Not on file  Other Topics Concern  . Not on file  Social History Narrative  . Not on file   Review of Systems  Constitutional: Negative for fever and weight loss.  HENT: Negative for ear discharge, ear pain, hearing loss and tinnitus.   Eyes: Negative for blurred vision, double vision, photophobia and pain.  Respiratory: Negative for cough and shortness of breath.   Cardiovascular: Negative for chest pain and palpitations.  Gastrointestinal: Negative for abdominal pain, blood in stool, constipation, diarrhea, heartburn, melena, nausea and vomiting.  Genitourinary: Negative for dysuria, flank pain, frequency, hematuria and urgency.  Musculoskeletal:  Negative for falls.  Neurological: Negative for dizziness, loss of consciousness and headaches.  Endo/Heme/Allergies: Negative for environmental allergies.  Psychiatric/Behavioral: Negative for depression, hallucinations, substance abuse and suicidal ideas. The patient is not nervous/anxious and does not have insomnia.    BP 116/82   Pulse 72   Temp 98.4 F (36.9 C) (Oral)   Resp 16   Ht 5\' 8"  (1.727 m)   Wt 185 lb (83.9 kg)   SpO2 99%   BMI 28.13 kg/m   Physical Exam  Constitutional: She is oriented to person, place, and time and well-developed, well-nourished, and in no distress.  HENT:  Head: Normocephalic and atraumatic.  Right Ear: Tympanic membrane, external ear and ear canal normal.  Left Ear: Tympanic membrane, external ear and ear canal normal.  Nose: Nose normal. No mucosal edema.  Mouth/Throat: Uvula is midline, oropharynx is clear and moist and mucous membranes are normal. No oropharyngeal exudate or posterior oropharyngeal erythema.  Eyes: Conjunctivae are normal. Pupils are equal, round, and reactive to light.  Neck: Neck supple. No thyromegaly present.  Cardiovascular: Normal rate, regular rhythm, normal heart sounds and intact distal pulses.  Pulmonary/Chest: Effort normal and breath sounds normal. No respiratory distress. She has no wheezes. She has no rales.  Abdominal: Soft. Bowel sounds are normal. She exhibits no distension and no mass. There is no tenderness. There is no rebound and no guarding.  Lymphadenopathy:    She has no cervical adenopathy.  Neurological: She is alert and oriented to person, place, and time. No cranial nerve deficit.  Skin: Skin is warm and dry. No rash noted.     Psychiatric: Affect normal.  Vitals reviewed.  Recent Results (from the past 2160 hour(s))  POCT urinalysis dipstick     Status: Abnormal   Collection Time: 01/21/18  9:23 AM  Result Value Ref Range   Color, UA dark yellow    Clarity, UA hazy    Glucose, UA negative      Bilirubin, UA negative    Ketones, UA negative    Spec Grav, UA <=1.005 (A) 1.010 - 1.025   Blood, UA negative    pH, UA >=9.0 (A) 5.0 - 8.0   Protein, UA +    Urobilinogen, UA 0.2 0.2 or 1.0 E.U./dL   Nitrite, UA negative    Leukocytes, UA Negative Negative   Appearance     Odor      Assessment/Plan: PVC (premature ventricular contraction) Symptoms well-controlled with Metoprolol. Denies any symptomatic PVCs on medication. Continue current regimen.  Hypertension Normotensive. Asymptomatic. Continue current regimen. Will check CMP today.  Visit for preventive health examination Depression screen negative. Health Maintenance reviewed -- Declines flu shot. Tetanus is up-to-date. Is scheduling  PAP with GYN. Preventive schedule discussed and handout given in AVS. Will obtain fasting labs today.   Hyperlipidemia Will obtain fasting labs today. Discussed dietary and exercise recommendations.  Skin cysts, generalized Referral to Dermatology placed.    Leeanne Rio, PA-C

## 2018-01-21 ENCOUNTER — Encounter: Payer: Self-pay | Admitting: Physician Assistant

## 2018-01-21 ENCOUNTER — Other Ambulatory Visit: Payer: Self-pay

## 2018-01-21 ENCOUNTER — Ambulatory Visit (INDEPENDENT_AMBULATORY_CARE_PROVIDER_SITE_OTHER): Payer: 59 | Admitting: Physician Assistant

## 2018-01-21 VITALS — BP 116/82 | HR 72 | Temp 98.4°F | Resp 16 | Ht 68.0 in | Wt 185.0 lb

## 2018-01-21 DIAGNOSIS — I493 Ventricular premature depolarization: Secondary | ICD-10-CM

## 2018-01-21 DIAGNOSIS — Z0001 Encounter for general adult medical examination with abnormal findings: Secondary | ICD-10-CM | POA: Diagnosis not present

## 2018-01-21 DIAGNOSIS — E7800 Pure hypercholesterolemia, unspecified: Secondary | ICD-10-CM

## 2018-01-21 DIAGNOSIS — I10 Essential (primary) hypertension: Secondary | ICD-10-CM | POA: Diagnosis not present

## 2018-01-21 DIAGNOSIS — Z0000 Encounter for general adult medical examination without abnormal findings: Secondary | ICD-10-CM

## 2018-01-21 DIAGNOSIS — L729 Follicular cyst of the skin and subcutaneous tissue, unspecified: Secondary | ICD-10-CM | POA: Diagnosis not present

## 2018-01-21 LAB — POCT URINALYSIS DIPSTICK
Bilirubin, UA: NEGATIVE
Blood, UA: NEGATIVE
Glucose, UA: NEGATIVE
KETONES UA: NEGATIVE
LEUKOCYTES UA: NEGATIVE
NITRITE UA: NEGATIVE
Spec Grav, UA: <=1.005 — AB (ref 1.010–1.025)
UROBILINOGEN UA: 0.2 U/dL

## 2018-01-21 LAB — LIPID PANEL
CHOL/HDL RATIO: 3
CHOLESTEROL: 237 mg/dL — AB (ref 0–200)
HDL: 70.6 mg/dL (ref >39.00)
LDL CALC: 150 mg/dL — AB (ref 0–99)
NonHDL: 166.28
Triglycerides: 83 mg/dL (ref 0.0–149.0)
VLDL: 16.6 mg/dL (ref 0.0–40.0)

## 2018-01-21 LAB — COMPREHENSIVE METABOLIC PANEL
ALK PHOS: 84 U/L (ref 39–117)
ALT: 13 U/L (ref 0–35)
AST: 14 U/L (ref 0–37)
Albumin: 4.3 g/dL (ref 3.5–5.2)
BILIRUBIN TOTAL: 0.9 mg/dL (ref 0.2–1.2)
BUN: 11 mg/dL (ref 6–23)
CO2: 24 mEq/L (ref 19–32)
Calcium: 9.1 mg/dL (ref 8.4–10.5)
Chloride: 104 mEq/L (ref 96–112)
Creatinine, Ser: 0.92 mg/dL (ref 0.40–1.20)
GFR: 84.45 mL/min (ref >60.00)
Glucose, Bld: 80 mg/dL (ref 70–99)
Potassium: 4.3 mEq/L (ref 3.5–5.1)
SODIUM: 137 meq/L (ref 135–145)
TOTAL PROTEIN: 7 g/dL (ref 6.0–8.3)

## 2018-01-21 LAB — CBC WITH DIFFERENTIAL/PLATELET
BASOS ABS: 0 10*3/uL (ref 0.0–0.1)
Basophils Relative: 0.5 % (ref 0.0–3.0)
Eosinophils Absolute: 0.1 10*3/uL (ref 0.0–0.7)
Eosinophils Relative: 0.9 % (ref 0.0–5.0)
HCT: 39.9 % (ref 36.0–46.0)
HEMOGLOBIN: 13.3 g/dL (ref 12.0–15.0)
LYMPHS ABS: 1.4 10*3/uL (ref 0.7–4.0)
Lymphocytes Relative: 24.2 % (ref 12.0–46.0)
MCHC: 33.3 g/dL (ref 30.0–36.0)
MCV: 83.4 fl (ref 78.0–100.0)
MONOS PCT: 6.3 % (ref 3.0–12.0)
Monocytes Absolute: 0.4 10*3/uL (ref 0.1–1.0)
NEUTROS PCT: 68.1 % (ref 43.0–77.0)
Neutro Abs: 4 10*3/uL (ref 1.4–7.7)
Platelets: 302 10*3/uL (ref 150.0–400.0)
RBC: 4.78 Mil/uL (ref 3.87–5.11)
RDW: 14.5 % (ref 11.5–15.5)
WBC: 5.9 10*3/uL (ref 4.0–10.5)

## 2018-01-21 LAB — TSH: TSH: 1.87 u[IU]/mL (ref 0.35–4.50)

## 2018-01-21 MED ORDER — TRIAMCINOLONE ACETONIDE 0.5 % EX OINT: 1.0000 "application " | TOPICAL_OINTMENT | Freq: Two times a day (BID) | CUTANEOUS | 2 refills | Status: DC

## 2018-01-21 NOTE — Assessment & Plan Note (Signed)
Normotensive. Asymptomatic. Continue current regimen. Will check CMP today.

## 2018-01-21 NOTE — Patient Instructions (Addendum)
Please go to the lab for blood work.   Our office will call you with your results unless you have chosen to receive results via MyChart.  If your blood work is normal we will follow-up each year for physicals and as scheduled for chronic medical problems.  If anything is abnormal we will treat accordingly and get you in for a follow-up.  Continue chronic medications as directed.  Schedule an appointment with a new Gynecologist.  I am placing a referral for you to dermatology. Call me if you do not hear from them within a week.   Preventive Care 40-64 Years, Female Preventive care refers to lifestyle choices and visits with your health care provider that can promote health and wellness. What does preventive care include?  A yearly physical exam. This is also called an annual well check.  Dental exams once or twice a year.  Routine eye exams. Ask your health care provider how often you should have your eyes checked.  Personal lifestyle choices, including: ? Daily care of your teeth and gums. ? Regular physical activity. ? Eating a healthy diet. ? Avoiding tobacco and drug use. ? Limiting alcohol use. ? Practicing safe sex. ? Taking low-dose aspirin daily starting at age 40. ? Taking vitamin and mineral supplements as recommended by your health care provider. What happens during an annual well check? The services and screenings done by your health care provider during your annual well check will depend on your age, overall health, lifestyle risk factors, and family history of disease. Counseling Your health care provider may ask you questions about your:  Alcohol use.  Tobacco use.  Drug use.  Emotional well-being.  Home and relationship well-being.  Sexual activity.  Eating habits.  Work and work Statistician.  Method of birth control.  Menstrual cycle.  Pregnancy history.  Screening You may have the following tests or measurements:  Height, weight, and  BMI.  Blood pressure.  Lipid and cholesterol levels. These may be checked every 5 years, or more frequently if you are over 48 years old.  Skin check.  Lung cancer screening. You may have this screening every year starting at age 44 if you have a 30-pack-year history of smoking and currently smoke or have quit within the past 15 years.  Fecal occult blood test (FOBT) of the stool. You may have this test every year starting at age 79.  Flexible sigmoidoscopy or colonoscopy. You may have a sigmoidoscopy every 5 years or a colonoscopy every 10 years starting at age 13.  Hepatitis C blood test.  Hepatitis B blood test.  Sexually transmitted disease (STD) testing.  Diabetes screening. This is done by checking your blood sugar (glucose) after you have not eaten for a while (fasting). You may have this done every 1-3 years.  Mammogram. This may be done every 1-2 years. Talk to your health care provider about when you should start having regular mammograms. This may depend on whether you have a family history of breast cancer.  BRCA-related cancer screening. This may be done if you have a family history of breast, ovarian, tubal, or peritoneal cancers.  Pelvic exam and Pap test. This may be done every 3 years starting at age 33. Starting at age 19, this may be done every 5 years if you have a Pap test in combination with an HPV test.  Bone density scan. This is done to screen for osteoporosis. You may have this scan if you are at high risk for  osteoporosis.  Discuss your test results, treatment options, and if necessary, the need for more tests with your health care provider. Vaccines Your health care provider may recommend certain vaccines, such as:  Influenza vaccine. This is recommended every year.  Tetanus, diphtheria, and acellular pertussis (Tdap, Td) vaccine. You may need a Td booster every 10 years.  Varicella vaccine. You may need this if you have not been vaccinated.  Zoster  vaccine. You may need this after age 41.  Measles, mumps, and rubella (MMR) vaccine. You may need at least one dose of MMR if you were born in 1957 or later. You may also need a second dose.  Pneumococcal 13-valent conjugate (PCV13) vaccine. You may need this if you have certain conditions and were not previously vaccinated.  Pneumococcal polysaccharide (PPSV23) vaccine. You may need one or two doses if you smoke cigarettes or if you have certain conditions.  Meningococcal vaccine. You may need this if you have certain conditions.  Hepatitis A vaccine. You may need this if you have certain conditions or if you travel or work in places where you may be exposed to hepatitis A.  Hepatitis B vaccine. You may need this if you have certain conditions or if you travel or work in places where you may be exposed to hepatitis B.  Haemophilus influenzae type b (Hib) vaccine. You may need this if you have certain conditions.  Talk to your health care provider about which screenings and vaccines you need and how often you need them. This information is not intended to replace advice given to you by your health care provider. Make sure you discuss any questions you have with your health care provider. Document Released: 01/03/2016 Document Revised: 08/26/2016 Document Reviewed: 10/08/2015 Elsevier Interactive Patient Education  [employer].

## 2018-01-21 NOTE — Assessment & Plan Note (Signed)
-   Referral to Dermatology placed

## 2018-01-21 NOTE — Assessment & Plan Note (Signed)
Will obtain fasting labs today. Discussed dietary and exercise recommendations.

## 2018-01-21 NOTE — Assessment & Plan Note (Signed)
Depression screen negative. Health Maintenance reviewed -- Declines flu shot. Tetanus is up-to-date. Is scheduling PAP with GYN. Preventive schedule discussed and handout given in AVS. Will obtain fasting labs today.

## 2018-01-21 NOTE — Assessment & Plan Note (Signed)
Symptoms well-controlled with Metoprolol. Denies any symptomatic PVCs on medication. Continue current regimen.

## 2018-03-09 DIAGNOSIS — L910 Hypertrophic scar: Secondary | ICD-10-CM | POA: Diagnosis not present

## 2018-03-09 DIAGNOSIS — L700 Acne vulgaris: Secondary | ICD-10-CM | POA: Diagnosis not present

## 2018-05-04 DIAGNOSIS — B373 Candidiasis of vulva and vagina: Secondary | ICD-10-CM | POA: Diagnosis not present

## 2018-07-04 ENCOUNTER — Telehealth: Payer: Self-pay | Admitting: Physician Assistant

## 2018-07-04 DIAGNOSIS — B373 Candidiasis of vulva and vagina: Secondary | ICD-10-CM

## 2018-07-04 DIAGNOSIS — B3731 Acute candidiasis of vulva and vagina: Secondary | ICD-10-CM

## 2018-07-04 DIAGNOSIS — Z01419 Encounter for gynecological examination (general) (routine) without abnormal findings: Secondary | ICD-10-CM | POA: Diagnosis not present

## 2018-07-04 DIAGNOSIS — Z6828 Body mass index (BMI) 28.0-28.9, adult: Secondary | ICD-10-CM | POA: Diagnosis not present

## 2018-07-04 NOTE — Telephone Encounter (Signed)
Advised patient of last A1C which was Jan 2018 was 5.6 We did not check a A1C at recent CPE in 01/2018 Glucose level was 80 in normal range Her GYN advised that she should get A1C checked due to reoccurring yeast infections.  Patient scheduled for lab visit per PCP. Order placed.

## 2018-07-04 NOTE — Telephone Encounter (Signed)
Copied from [place] [id number]. Topic: Quick Communication - See Telephone Encounter >> Jul 04, 2018  1:09 PM Synthia Innocent wrote: CRM for notification. See Telephone encounter for: 07/04/18. Patient would like to know what her last A1C was. GYN dr is concerned due to repeat yeast infection.

## 2018-07-07 ENCOUNTER — Other Ambulatory Visit (INDEPENDENT_AMBULATORY_CARE_PROVIDER_SITE_OTHER): Payer: 59

## 2018-07-07 DIAGNOSIS — B373 Candidiasis of vulva and vagina: Secondary | ICD-10-CM

## 2018-07-07 DIAGNOSIS — B3731 Acute candidiasis of vulva and vagina: Secondary | ICD-10-CM

## 2018-07-07 LAB — HEMOGLOBIN A1C: Hgb A1c MFr Bld: 5.9 % (ref 4.6–6.5)

## 2018-07-08 ENCOUNTER — Encounter: Payer: Self-pay | Admitting: Emergency Medicine

## 2018-07-12 LAB — HM PAP SMEAR

## 2018-08-25 ENCOUNTER — Telehealth: Payer: Self-pay | Admitting: Physician Assistant

## 2018-08-25 ENCOUNTER — Telehealth: Payer: Self-pay

## 2018-08-25 ENCOUNTER — Telehealth: Payer: Self-pay | Admitting: Emergency Medicine

## 2018-08-25 NOTE — Telephone Encounter (Signed)
Please advise of medication

## 2018-08-25 NOTE — Telephone Encounter (Signed)
I know that people typically do take this as a supplement for fatigue and for postmenopausal hormone changes. There is no sufficient data that's shows effectiveness of this or what medications it may interact with. I cannot say that I would recommend it but if she is wanting to try it I cannot fine any known contraindications to taking.

## 2018-08-25 NOTE — Telephone Encounter (Signed)
Pt. Would also like a referral to a female GYN.

## 2018-08-25 NOTE — Telephone Encounter (Signed)
Copied from [place] [phone]. Topic: Referral - Request >> Aug 25, 2018 11:39 AM Rutherford Nail, NT wrote: Reason for CRM: Patient requesting a new referral to a GYN. Prefers female. Please advise.

## 2018-08-25 NOTE — Telephone Encounter (Signed)
Pt. Given lab results from July 2019. Verbalizes understanding.

## 2018-08-25 NOTE — Telephone Encounter (Signed)
Copied from [place] [id number]. Topic: Quick Communication - See Telephone Encounter >> Aug 25, 2018 11:33 AM Rutherford Nail, NT wrote: CRM for notification. See Telephone encounter for: 08/25/18. Patient is wanting to know if the herb maca root for hormone balance could be something that she could take. States that she has heard good things about it. Wants to know what Cody's thoughts are on this. CB#: [phone]

## 2018-08-26 ENCOUNTER — Encounter: Payer: Self-pay | Admitting: Physician Assistant

## 2018-08-26 ENCOUNTER — Ambulatory Visit: Payer: 59 | Admitting: Physician Assistant

## 2018-08-26 ENCOUNTER — Other Ambulatory Visit: Payer: Self-pay

## 2018-08-26 VITALS — BP 120/70 | HR 83 | Temp 98.6°F | Resp 14 | Ht 68.0 in | Wt 182.0 lb

## 2018-08-26 DIAGNOSIS — B029 Zoster without complications: Secondary | ICD-10-CM

## 2018-08-26 DIAGNOSIS — L249 Irritant contact dermatitis, unspecified cause: Secondary | ICD-10-CM

## 2018-08-26 MED ORDER — TRIAMCINOLONE ACETONIDE 0.5 % EX OINT: 1.0000 "application " | TOPICAL_OINTMENT | Freq: Two times a day (BID) | CUTANEOUS | 0 refills | Status: DC

## 2018-08-26 NOTE — Progress Notes (Signed)
Patient presents to clinic today c/o rash of R shin present for a couple of days after being rubbed against by a cat. Is itchy and has been slightly blistery but this has gotten better. Denies fever, chills malaise.   Also notes a different looking rash of R hip and back that is burning in nature. Has been present for > 7 days.    Past Medical History:  Diagnosis Date  . Anxiety   . Gilbert syndrome   . Herpes exposure    Type 1 & 2 ; Dr Garwin Brothers, [place]  . Hyperlipidemia   . Hypertension   . Premature ventricular contractions     Current Outpatient Medications on File Prior to Visit  Medication Sig Dispense Refill  . aspirin 81 MG tablet Take 81 mg by mouth daily. 1 by mouth 2 x weekly    . Cholecalciferol (VITAMIN D3) 10000 units TABS Take 1 tablet by mouth daily.    . fluconazole (DIFLUCAN) 100 MG tablet Take 100 mg by mouth daily. Take one tablet 1 week prior to menstrual cycle.    Marland Kitchen FLUoxetine (PROZAC) 10 MG tablet Take 1-2 tablets (10-20 mg total) by mouth daily. (Patient taking differently: Take 5 mg by mouth at bedtime. ) 60 tablet 3  . Magnesium Glycinate POWD Takes daily  0  . metoprolol succinate (TOPROL-XL) 25 MG 24 hr tablet TAKE ONE-HALF(1/2) TABLET BY MOUTH DAILY 90 tablet 1  . Multiple Vitamin (MULTIVITAMIN) tablet Take 1 tablet by mouth daily.      . ondansetron (ZOFRAN) 8 MG tablet Take 8 mg by mouth every 8 (eight) hours as needed for nausea or vomiting.     . progesterone (PROMETRIUM) 100 MG capsule TAKE 1-2 CAPSULES BY MOUTH DAILY AT BEDTIME ON DAYS 15-28 OF CYCLE  0  . Serotonin HCl POWD Take 1 capsule by mouth at bedtime.     No current facility-administered medications on file prior to visit.     Allergies  Allergen Reactions  . Cephalexin      03/2012 Dyspnea  . Penicillins     Unknown reaction as child    Family History  Problem Relation Age of Onset  . Asthma Mother   . Stroke Father 77  . Colon cancer Father   . Diabetes Sister   . Hypertension  Brother   . Transient ischemic attack Brother 41  . Asthma Maternal Grandmother        died @ 56 of status asthmaticus  . Heart disease Neg Hx     Social History   Socioeconomic History  . Marital status: Married    Spouse name: Not on file  . Number of children: Not on file  . Years of education: Not on file  . Highest education level: Not on file  Occupational History  . Not on file  Social Needs  . Financial resource strain: Not on file  . Food insecurity:    Worry: Not on file    Inability: Not on file  . Transportation needs:    Medical: Not on file    Non-medical: Not on file  Tobacco Use  . Smoking status: Never Smoker  . Smokeless tobacco: Never Used  Substance and Sexual Activity  . Alcohol use: No  . Drug use: No  . Sexual activity: Not on file  Lifestyle  . Physical activity:    Days per week: Not on file    Minutes per session: Not on file  . Stress: Not on  file  Relationships  . Social connections:    Talks on phone: Not on file    Gets together: Not on file    Attends religious service: Not on file    Active member of club or organization: Not on file    Attends meetings of clubs or organizations: Not on file    Relationship status: Not on file  Other Topics Concern  . Not on file  Social History Narrative  . Not on file   Review of Systems - See HPI.  All other ROS are negative.  BP 120/70   Pulse 83   Temp 98.6 F (37 C) (Oral)   Resp 14   Ht 5\' 8"  (1.727 m)   Wt 182 lb (82.6 kg)   SpO2 99%   BMI 27.67 kg/m   Physical Exam  Constitutional: She appears well-developed and well-nourished.  HENT:  Head: Normocephalic and atraumatic.  Eyes: Conjunctivae and EOM are normal.  Neck: Neck supple.  Cardiovascular: Normal rate, regular rhythm, normal heart sounds and intact distal pulses.  Pulmonary/Chest: Effort normal and breath sounds normal.  Neurological: She is alert.  Skin:     Vitals reviewed.   Recent Results (from the past  2160 hour(s))  Hemoglobin A1c     Status: None   Collection Time: 07/07/18  8:17 AM  Result Value Ref Range   Hgb A1c MFr Bld 5.9 4.6 - 6.5 %    Comment: Glycemic Control Guidelines for People with Diabetes:Non Diabetic:  <6%Goal of Therapy: <7%Additional Action Suggested:  >8%    Assessment/Plan: 1. Irritant contact dermatitis, unspecified trigger Start Kenalog cream BID. Recommend witch hazel to the area. Avoid repeat exposure if possible. Follow-up if not improving.   2. Herpes zoster without complication Outside of window where antiviral will be helpful. Blisters are crusted over. Keep covered. Recommend OTC lidocaine spray to the area. No steroid to this region. Follow-up if not continuing to resolve.    Leeanne Rio, PA-C

## 2018-08-26 NOTE — Telephone Encounter (Signed)
Discussed with patient at her appointment today

## 2018-08-26 NOTE — Patient Instructions (Addendum)
There are two rashes present today. (1) The rash of your shin is most consistent with a contact dermatitis, likely something the car had on his fur that got onto your skin. Use the Kenalog 0.5% ointment given twice daily through the weekend to help this resolve. Can use up to 10 days if needed. If not resolved by then, call me.   (2) The rash on your R-side is consistent with shingles. Since this has been present for a week and there are no more active blisters, there is no benefit to starting an antiviral medication. Get some OTC lidocaine cream or spray to apply to the area to dull the nerve endings -- this will help with itch and burning. Keep covered until rash has resolved. Avoid contact with pregnant women as there is a chance the virus can be passed to the fetus.   Follow-up if symptoms are not resolving.    Contact Dermatitis Dermatitis is redness, soreness, and swelling (inflammation) of the skin. Contact dermatitis is a reaction to certain substances that touch the skin. You either touched something that irritated your skin, or you have allergies to something you touched. Follow these instructions at home: [place] your skin as needed.  Apply cool compresses to the affected areas.  Try taking a bath with: ? Epsom salts. Follow the instructions on the package. You can get these at a pharmacy or grocery store. ? Baking soda. Pour a small amount into the bath as told by your doctor. ? Colloidal oatmeal. Follow the instructions on the package. You can get this at a pharmacy or grocery store.  Try applying baking soda paste to your skin. Stir water into baking soda until it looks like paste.  Do not scratch your skin.  Bathe less often.  Bathe in lukewarm water. Avoid using hot water. Medicines  Take or apply over-the-counter and prescription medicines only as told by your doctor.  If you were prescribed an antibiotic medicine, take or apply your antibiotic as told  by your doctor. Do not stop taking the antibiotic even if your condition starts to get better. General instructions  Keep all follow-up visits as told by your doctor. This is important.  Avoid the substance that caused your reaction. If you do not know what caused it, keep a journal to try to track what caused it. Write down: ? What you eat. ? What cosmetic products you use. ? What you drink. ? What you wear in the affected area. This includes jewelry.  If you were given a bandage (dressing), take care of it as told by your doctor. This includes when to change and remove it. Contact a doctor if:  You do not get better with treatment.  Your condition gets worse.  You have signs of infection such as: ? Swelling. ? Tenderness. ? Redness. ? Soreness. ? Warmth.  You have a fever.  You have new symptoms. Get help right away if:  You have a very bad headache.  You have neck pain.  Your neck is stiff.  You throw up (vomit).  You feel very sleepy.  You see red streaks coming from the affected area.  Your bone or joint underneath the affected area becomes painful after the skin has healed.  The affected area turns darker.  You have trouble breathing. This information is not intended to replace advice given to you by your health care provider. Make sure you discuss any questions you have with your health care provider.  Document Released: 10/04/2009 Document Revised: 05/14/2016 Document Reviewed: 04/24/2015 Elsevier Interactive Patient Education  2018 [place] is an infection that causes a painful skin rash and fluid-filled blisters. Shingles is caused by the same virus that causes chickenpox. Shingles only develops in people who:  Have had chickenpox.  Have gotten the chickenpox vaccine. (This is rare.)  The first symptoms of shingles may be itching, tingling, or pain in an area on your skin. A rash will follow in a few days or weeks. The  rash is usually on one side of the body in a bandlike or beltlike pattern. Over time, the rash turns into fluid-filled blisters that break open, scab over, and dry up. Medicines may:  Help you manage pain.  Help you recover more quickly.  Help to prevent long-term problems.  Follow these instructions at home: Medicines  Take medicines only as told by your doctor.  Apply an anti-itch or numbing cream to the affected area as told by your doctor. Blister and Rash Care  Take a cool bath or put cool compresses on the area of the rash or blisters as told by your doctor. This may help with pain and itching.  Keep your rash covered with a loose bandage (dressing). Wear loose-fitting clothing.  Keep your rash and blisters clean with mild soap and cool water or as told by your doctor.  Check your rash every day for signs of infection. These include redness, swelling, and pain that lasts or gets worse.  Do not pick your blisters.  Do not scratch your rash. General instructions  Rest as told by your doctor.  Keep all follow-up visits as told by your doctor. This is important.  Until your blisters scab over, your infection can cause chickenpox in people who have never had it or been vaccinated against it. To prevent this from happening, avoid touching other people or being around other people, especially: ? Babies. ? Pregnant women. ? Children who have eczema. ? Elderly people who have transplants. ? People who have chronic illnesses, such as leukemia or AIDS. Contact a doctor if:  Your pain does not get better with medicine.  Your pain does not get better after the rash heals.  Your rash looks infected. Signs of infection include: ? Redness. ? Swelling. ? Pain that lasts or gets worse. Get help right away if:  The rash is on your face or nose.  You have pain in your face, pain around your eye area, or loss of feeling on one side of your face.  You have ear pain or you have  ringing in your ear.  You have loss of taste.  Your condition gets worse. This information is not intended to replace advice given to you by your health care provider. Make sure you discuss any questions you have with your health care provider. Document Released: 05/25/2008 Document Revised: 08/02/2016 Document Reviewed: 09/18/2014 Elsevier Interactive Patient Education  [employer].

## 2018-09-16 ENCOUNTER — Other Ambulatory Visit: Payer: Self-pay | Admitting: Internal Medicine

## 2018-09-19 ENCOUNTER — Other Ambulatory Visit: Payer: Self-pay | Admitting: Physician Assistant

## 2018-10-06 ENCOUNTER — Encounter: Payer: Self-pay | Admitting: Physician Assistant

## 2018-11-23 ENCOUNTER — Ambulatory Visit: Payer: 59 | Admitting: Physician Assistant

## 2018-11-23 ENCOUNTER — Ambulatory Visit: Payer: Self-pay

## 2018-11-23 ENCOUNTER — Encounter: Payer: Self-pay | Admitting: Physician Assistant

## 2018-11-23 ENCOUNTER — Other Ambulatory Visit: Payer: Self-pay

## 2018-11-23 VITALS — BP 130/90 | HR 76 | Temp 98.6°F | Resp 14 | Ht 68.0 in | Wt 185.0 lb

## 2018-11-23 DIAGNOSIS — I10 Essential (primary) hypertension: Secondary | ICD-10-CM | POA: Diagnosis not present

## 2018-11-23 NOTE — Telephone Encounter (Signed)
Pt. Called to report elevated BP at 11:30 PM last night.  Stated she had light-headed feeling at that time.  Also reported noting shortness of breath @ that time when laying flat.  Stated she rechecked BP at 2:00 AM; 164/84.  This AM, stated she has intermittent light headed feeling.  Stated "I just want to come in and get checked, because I  feel something is not right."  Denied chest pain, headache, blurred vision, speech changes, weakness of extremities.  BP rechecked at present time; 146/76 @ 8:15 AM.  Appt. Given with PCP at 9:15 AM.  Care advice given per protocol. Verb. Understanding.     Reason for Disposition . Systolic BP  >= 098 OR Diastolic >= 119  Answer Assessment - Initial Assessment Questions 1. BLOOD PRESSURE: "What is the blood pressure?" "Did you take at least two measurements 5 minutes apart?"     BP 180/90@ 11:30 PM  164/84 @ 2:00 AM 2. ONSET: "When did you take your blood pressure?"     See above 3. HOW: "How did you obtain the blood pressure?" (e.g., visiting nurse, automatic home BP monitor)     Digital cuff  4. HISTORY: "Do you have a history of high blood pressure?"     yes 5. MEDICATIONS: "Are you taking any medications for blood pressure?" "Have you missed any doses recently?"     Taking medications as directed 6. OTHER SYMPTOMS: "Do you have any symptoms?" (e.g., headache, chest pain, blurred vision, difficulty breathing, weakness)     Light-headed, shortness of breath when laying down last night, denied blurred vision, chest pain, weakness of extremities or speech changes   7. PREGNANCY: "Is there any chance you are pregnant?" "When was your last menstrual period?"    No; just ending menstrual cycle  Protocols used: HIGH BLOOD PRESSURE-A-AH

## 2018-11-23 NOTE — Patient Instructions (Signed)
Please keep well-hydrated and continue your low-salt diet.  Increase the Toprol XL to 1 tablet daily for now. Be very consistent about taking this at the same time daily to prevent rebound hypertension.   Follow-up with me in 7-10 days for reassessment.

## 2018-11-24 DIAGNOSIS — S0003XA Contusion of scalp, initial encounter: Secondary | ICD-10-CM | POA: Diagnosis not present

## 2018-11-27 NOTE — Progress Notes (Signed)
Patient presents to clinic today c/o intermittent elevated BP measurements over the past 1.5 months but more consistently over the past few days. Notes BP up in 160s/100s. Notes headache with elevated BP. Denies chest pain, palpitations, LH or dizziness. Notes stress but no recent change in this. She feels like she is handling stress well. Notes low-salt diet. Notes she typically takes her Toprol XL at night, sometime between 8PM and 1 AM. Notes she took her BP medication later last night. Note elevated BP are mostly late at night.  Past Medical History:  Diagnosis Date  . Anxiety   . Gilbert syndrome   . Herpes exposure    Type 1 & 2 ; Dr Garwin Brothers, [place]  . Hyperlipidemia   . Hypertension   . Premature ventricular contractions     Current Outpatient Medications on File Prior to Visit  Medication Sig Dispense Refill  . aspirin 81 MG tablet Take 81 mg by mouth daily. 1 by mouth 2 x weekly    . Cholecalciferol (VITAMIN D3) 10000 units TABS Take 1 tablet by mouth daily.    . fluconazole (DIFLUCAN) 100 MG tablet Take 100 mg by mouth daily. Take one tablet 1 week prior to menstrual cycle.    Marland Kitchen FLUoxetine (PROZAC) 10 MG tablet Take 0.5 tablets (5 mg total) by mouth at bedtime. 60 tablet 0  . Magnesium Glycinate POWD Takes daily  0  . metoprolol succinate (TOPROL-XL) 25 MG 24 hr tablet TAKE ONE-HALF(1/2) TABLET BY MOUTH DAILY 90 tablet 1  . Multiple Vitamin (MULTIVITAMIN) tablet Take 1 tablet by mouth daily.      . Serotonin HCl POWD Take 1 capsule by mouth at bedtime. Seratrex    . triamcinolone ointment (KENALOG) 0.5 % Apply 1 application topically 2 (two) times daily. 30 g 0   No current facility-administered medications on file prior to visit.     Allergies  Allergen Reactions  . Cephalexin      03/2012 Dyspnea  . Penicillins     Unknown reaction as child    Family History  Problem Relation Age of Onset  . Asthma Mother   . Stroke Father 25  . Colon cancer Father   . Diabetes  Sister   . Hypertension Brother   . Transient ischemic attack Brother 41  . Asthma Maternal Grandmother        died @ 12 of status asthmaticus  . Heart disease Neg Hx     Social History   Socioeconomic History  . Marital status: Married    Spouse name: Not on file  . Number of children: Not on file  . Years of education: Not on file  . Highest education level: Not on file  Occupational History  . Not on file  Social Needs  . Financial resource strain: Not on file  . Food insecurity:    Worry: Not on file    Inability: Not on file  . Transportation needs:    Medical: Not on file    Non-medical: Not on file  Tobacco Use  . Smoking status: Never Smoker  . Smokeless tobacco: Never Used  Substance and Sexual Activity  . Alcohol use: No  . Drug use: No  . Sexual activity: Not on file  Lifestyle  . Physical activity:    Days per week: Not on file    Minutes per session: Not on file  . Stress: Not on file  Relationships  . Social connections:    Talks on phone:  Not on file    Gets together: Not on file    Attends religious service: Not on file    Active member of club or organization: Not on file    Attends meetings of clubs or organizations: Not on file    Relationship status: Not on file  Other Topics Concern  . Not on file  Social History Narrative  . Not on file   Review of Systems - See HPI.  All other ROS are negative.  BP 130/90   Pulse 76   Temp 98.6 F (37 C) (Oral)   Resp 14   Ht 5\' 8"  (1.727 m)   Wt 185 lb (83.9 kg)   SpO2 99%   BMI 28.13 kg/m   Physical Exam  Constitutional: She is oriented to person, place, and time. She appears well-developed and well-nourished.  HENT:  Head: Normocephalic and atraumatic.  Right Ear: External ear normal.  Left Ear: External ear normal.  Eyes: Conjunctivae are normal.  Neck: Neck supple.  Cardiovascular: Normal rate, regular rhythm, normal heart sounds and intact distal pulses.  Pulmonary/Chest: Effort  normal and breath sounds normal. No stridor. No respiratory distress. She has no wheezes. She has no rales. She exhibits no tenderness.  Neurological: She is alert and oriented to person, place, and time.  Psychiatric: She has a normal mood and affect.  Vitals reviewed.  Assessment/Plan: 1. Essent2ial hypertension Exam unremarkable. EKG with NSR. BP slightly above goal. History makes me wonder if she is having some level or rebound hypertension due to how sporadic she is with timing of her Toprol XL. Will have her increase to 25 mg daily. Consistent timing of medication. Continue DASH diet. Follow-up 10-14 days for reassessment. Bring BP log to visit.  - EKG 12-Lead   Leeanne Rio, PA-C

## 2018-12-07 ENCOUNTER — Encounter: Payer: Self-pay | Admitting: Physician Assistant

## 2018-12-07 ENCOUNTER — Ambulatory Visit: Payer: 59 | Admitting: Physician Assistant

## 2018-12-07 ENCOUNTER — Other Ambulatory Visit: Payer: Self-pay

## 2018-12-07 VITALS — BP 132/84 | HR 66 | Temp 98.3°F | Resp 14 | Ht 68.0 in | Wt 186.0 lb

## 2018-12-07 DIAGNOSIS — I10 Essential (primary) hypertension: Secondary | ICD-10-CM

## 2018-12-07 NOTE — Patient Instructions (Signed)
Please keep hydrated and get plenty of rest. Make sure you are eating protein any time you are having carbs to help keep sugar stable.  Your body is adjusting to a more normal BP measurement. Suspect you will start feeling even better over the next week or so.  Take some OTC Pepcid for a few days to help with reflux. Follow diet below.  Follow-up in 4 weeks for reassessment. Sooner if needed.   Food Choices for Gastroesophageal Reflux Disease, Adult When you have gastroesophageal reflux disease (GERD), the foods you eat and your eating habits are very important. Choosing the right foods can help ease your discomfort. Think about working with a nutrition specialist (dietitian) to help you make good choices. What are tips for following this plan?  Meals  Choose healthy foods that are low in fat, such as fruits, vegetables, whole grains, low-fat dairy products, and lean meat, fish, and poultry.  Eat small meals often instead of 3 large meals a day. Eat your meals slowly, and in a place where you are relaxed. Avoid bending over or lying down until 2-3 hours after eating.  Avoid eating meals 2-3 hours before bed.  Avoid drinking a lot of liquid with meals.  Cook foods using methods other than frying. Bake, grill, or broil food instead.  Avoid or limit: ? Chocolate. ? Peppermint or spearmint. ? Alcohol. ? Pepper. ? Black and decaffeinated coffee. ? Black and decaffeinated tea. ? Bubbly (carbonated) soft drinks. ? Caffeinated energy drinks and soft drinks.  Limit high-fat foods such as: ? Fatty meat or fried foods. ? Whole milk, cream, butter, or ice cream. ? Nuts and nut butters. ? Pastries, donuts, and sweets made with butter or shortening.  Avoid foods that cause symptoms. These foods may be different for everyone. Common foods that cause symptoms include: ? Tomatoes. ? Oranges, lemons, and limes. ? Peppers. ? Spicy food. ? Onions and  garlic. ? Vinegar. Lifestyle  Maintain a healthy weight. Ask your doctor what weight is healthy for you. If you need to lose weight, work with your doctor to do so safely.  Exercise for at least 30 minutes for 5 or more days each week, or as told by your doctor.  Wear loose-fitting clothes.  Do not smoke. If you need help quitting, ask your doctor.  Sleep with the head of your bed higher than your feet. Use a wedge under the mattress or blocks under the bed frame to raise the head of the bed. Summary  When you have gastroesophageal reflux disease (GERD), food and lifestyle choices are very important in easing your symptoms.  Eat small meals often instead of 3 large meals a day. Eat your meals slowly, and in a place where you are relaxed.  Limit high-fat foods such as fatty meat or fried foods.  Avoid bending over or lying down until 2-3 hours after eating.  Avoid peppermint and spearmint, caffeine, alcohol, and chocolate. This information is not intended to replace advice given to you by your health care provider. Make sure you discuss any questions you have with your health care provider. Document Released: 06/07/2012 Document Revised: 01/12/2017 Document Reviewed: 01/12/2017 Elsevier Interactive Patient Education  2019 [employer].

## 2018-12-07 NOTE — Progress Notes (Signed)
Patient presents to clinic today for follow-up of hypertension after increase in Toprol XL to 25 mg daily. Endorses taking medication as directed. Notes BP much improved at home. Has BP log for review. Patient denies chest pain, palpitations, lightheadedness, dizziness, vision changes or frequent headaches. Noted initially feeling slightly lightheaded with first dose of medicine but this has resolved.   Past Medical History:  Diagnosis Date  . Anxiety   . Gilbert syndrome   . Herpes exposure    Type 1 & 2 ; Dr Garwin Brothers, [place]  . Hyperlipidemia   . Hypertension   . Premature ventricular contractions     Current Outpatient Medications on File Prior to Visit  Medication Sig Dispense Refill  . aspirin 81 MG tablet Take 81 mg by mouth daily. 1 by mouth 2 x weekly    . Cholecalciferol (VITAMIN D3) 10000 units TABS Take 1 tablet by mouth daily.    . fluconazole (DIFLUCAN) 100 MG tablet Take 100 mg by mouth daily. Take one tablet 1 week prior to menstrual cycle.    Marland Kitchen FLUoxetine (PROZAC) 10 MG tablet Take 0.5 tablets (5 mg total) by mouth at bedtime. 60 tablet 0  . Magnesium Glycinate POWD Takes daily  0  . metoprolol succinate (TOPROL-XL) 25 MG 24 hr tablet TAKE ONE-HALF(1/2) TABLET BY MOUTH DAILY (Patient taking differently: Take 25 mg by mouth daily. ) 90 tablet 1  . Multiple Vitamin (MULTIVITAMIN) tablet Take 1 tablet by mouth daily.      . Serotonin HCl POWD Take 1 capsule by mouth at bedtime. Seratrex    . triamcinolone ointment (KENALOG) 0.5 % Apply 1 application topically 2 (two) times daily. 30 g 0   No current facility-administered medications on file prior to visit.     Allergies  Allergen Reactions  . Cephalexin      03/2012 Dyspnea  . Penicillins     Unknown reaction as child    Family History  Problem Relation Age of Onset  . Asthma Mother   . Stroke Father 67  . Colon cancer Father   . Diabetes Sister   . Hypertension Brother   . Transient ischemic attack Brother 41    . Asthma Maternal Grandmother        died @ 39 of status asthmaticus  . Heart disease Neg Hx     Social History   Socioeconomic History  . Marital status: Married    Spouse name: Not on file  . Number of children: Not on file  . Years of education: Not on file  . Highest education level: Not on file  Occupational History  . Not on file  Social Needs  . Financial resource strain: Not on file  . Food insecurity:    Worry: Not on file    Inability: Not on file  . Transportation needs:    Medical: Not on file    Non-medical: Not on file  Tobacco Use  . Smoking status: Never Smoker  . Smokeless tobacco: Never Used  Substance and Sexual Activity  . Alcohol use: No  . Drug use: No  . Sexual activity: Not on file  Lifestyle  . Physical activity:    Days per week: Not on file    Minutes per session: Not on file  . Stress: Not on file  Relationships  . Social connections:    Talks on phone: Not on file    Gets together: Not on file    Attends religious service: Not on  file    Active member of club or organization: Not on file    Attends meetings of clubs or organizations: Not on file    Relationship status: Not on file  Other Topics Concern  . Not on file  Social History Narrative  . Not on file   Review of Systems - See HPI.  All other ROS are negative.  BP 138/88   Pulse 66   Temp 98.3 F (36.8 C) (Oral)   Resp 14   Ht 5\' 8"  (1.727 m)   Wt 186 lb (84.4 kg)   SpO2 99%   BMI 28.28 kg/m   Physical Exam Vitals signs reviewed.  Constitutional:      Appearance: Normal appearance.  HENT:     Head: Normocephalic and atraumatic.     Right Ear: Tympanic membrane normal.     Left Ear: Tympanic membrane normal.  Eyes:     Conjunctiva/sclera: Conjunctivae normal.  Neck:     Musculoskeletal: Neck supple.  Cardiovascular:     Rate and Rhythm: Normal rate and regular rhythm.     Pulses: Normal pulses.     Heart sounds: Normal heart sounds.  Pulmonary:      Effort: Pulmonary effort is normal.     Breath sounds: Normal breath sounds.  Lymphadenopathy:     Cervical: No cervical adenopathy.  Neurological:     Mental Status: She is alert.    Assessment/Plan: 1. Essential hypertension Home BP levels much improved. Continue current regimen. Dietary recommendations given. Follow-up 1 month.   Leeanne Rio, PA-C

## 2018-12-10 ENCOUNTER — Other Ambulatory Visit: Payer: Self-pay | Admitting: Physician Assistant

## 2018-12-14 ENCOUNTER — Encounter (HOSPITAL_COMMUNITY): Payer: Self-pay

## 2018-12-14 ENCOUNTER — Emergency Department (HOSPITAL_COMMUNITY)
Admission: EM | Admit: 2018-12-14 | Discharge: 2018-12-15 | Disposition: A | Discharge status: Home / Self Care | Payer: 59 | Attending: Emergency Medicine | Admitting: Emergency Medicine

## 2018-12-14 DIAGNOSIS — N939 Abnormal uterine and vaginal bleeding, unspecified: Secondary | ICD-10-CM | POA: Diagnosis not present

## 2018-12-14 DIAGNOSIS — Z7982 Long term (current) use of aspirin: Secondary | ICD-10-CM | POA: Insufficient documentation

## 2018-12-14 DIAGNOSIS — E785 Hyperlipidemia, unspecified: Secondary | ICD-10-CM | POA: Insufficient documentation

## 2018-12-14 DIAGNOSIS — N938 Other specified abnormal uterine and vaginal bleeding: Secondary | ICD-10-CM | POA: Insufficient documentation

## 2018-12-14 DIAGNOSIS — I10 Essential (primary) hypertension: Secondary | ICD-10-CM | POA: Diagnosis not present

## 2018-12-14 DIAGNOSIS — D259 Leiomyoma of uterus, unspecified: Secondary | ICD-10-CM | POA: Diagnosis not present

## 2018-12-14 DIAGNOSIS — Z79899 Other long term (current) drug therapy: Secondary | ICD-10-CM | POA: Insufficient documentation

## 2018-12-14 LAB — TYPE AND SCREEN
ABO/RH(D): O POS
Antibody Screen: NEGATIVE

## 2018-12-14 LAB — CBC
HEMATOCRIT: 38.7 % (ref 36.0–46.0)
Hemoglobin: 12.2 g/dL (ref 12.0–15.0)
MCH: 26.9 pg (ref 26.0–34.0)
MCHC: 31.5 g/dL (ref 30.0–36.0)
MCV: 85.4 fL (ref 80.0–100.0)
Platelets: 326 10*3/uL (ref 150–400)
RBC: 4.53 MIL/uL (ref 3.87–5.11)
RDW: 14.5 % (ref 11.5–15.5)
WBC: 7.7 10*3/uL (ref 4.0–10.5)
nRBC: 0 % (ref 0.0–0.2)

## 2018-12-14 LAB — I-STAT BETA HCG BLOOD, ED (MC, WL, AP ONLY): I-stat hCG, quantitative: <5 m[IU]/mL (ref <5)

## 2018-12-14 NOTE — ED Triage Notes (Signed)
Pt reports heavy vaginal bleeding since about 3pm today. States she is passing lots of dark red clots, changing her pad at least once an hour. Pt having mild abd cramping.

## 2018-12-15 ENCOUNTER — Emergency Department (HOSPITAL_COMMUNITY): Payer: 59

## 2018-12-15 DIAGNOSIS — D259 Leiomyoma of uterus, unspecified: Secondary | ICD-10-CM | POA: Diagnosis not present

## 2018-12-15 LAB — PROTIME-INR
INR: 0.98
Prothrombin Time: 12.9 seconds (ref 11.4–15.2)

## 2018-12-15 LAB — WET PREP, GENITAL
Clue Cells Wet Prep HPF POC: NONE SEEN
Sperm: NONE SEEN
Trich, Wet Prep: NONE SEEN
Yeast Wet Prep HPF POC: NONE SEEN

## 2018-12-15 LAB — ABO/RH: ABO/RH(D): O POS

## 2018-12-15 MED ORDER — MEGESTROL ACETATE 40 MG PO TABS: ORAL_TABLET | ORAL | 0 refills | Status: DC

## 2018-12-15 MED ORDER — SODIUM CHLORIDE 0.9 % IV BOLUS
1000.0000 mL | Freq: Once | INTRAVENOUS | Status: AC
  Administered 2018-12-15: 1000 mL via INTRAVENOUS

## 2018-12-15 NOTE — ED Provider Notes (Signed)
[facility] Provider Note   CSN: [id number] Arrival date & time: 12/14/18  2227     History   Chief Complaint Chief Complaint  Patient presents with  . Vaginal Bleeding    HPI [name] is a 47 y.o. female.  Patient presents with heavy vaginal bleeding with clots since about mid afternoon on December 24.  States she is gone through about 9 pads in the past 24 hours and passed several large clots.  She states she normally goes through about 3-4 pads on her usual cycle.  She is due to come on her cycle in 2 days.  She is had intermittent cramping but no significant pain.  She has had some generalized weakness and lightheadedness.  No chest pain, shortness of breath or abdominal pain.  No focal weakness, numbness or tingling.  She denies possibility of pregnancy.  She reports she is not had any issues with heavy bleeding in the past.  Not taking blood thinners.  The history is provided by the patient.  Vaginal Bleeding  Primary symptoms include vaginal bleeding.  Primary symptoms include no dysuria. Associated symptoms include light-headedness. Pertinent negatives include no abdominal pain, no nausea, no vomiting and no dizziness.    Past Medical History:  Diagnosis Date  . Anxiety   . Gilbert syndrome   . Herpes exposure    Type 1 & 2 ; Dr Garwin Brothers, [place]  . Hyperlipidemia   . Hypertension   . Premature ventricular contractions     Patient Active Problem List   Diagnosis Date Noted  . Visit for preventive health examination 01/21/2018  . Skin cysts, generalized 01/21/2018  . Dermatitis 10/15/2017  . Vitamin D deficiency 12/23/2016  . Vitamin B12 deficiency 12/23/2016  . Fibrocystic breast disease 12/23/2016  . Seasonal allergic rhinitis 06/05/2013  . Mitral regurgitation 06/02/2011  . Hyperlipidemia 11/14/2008  . Hypertension 07/13/2008  . GILBERT'S SYNDROME 06/22/2007  . Anxiety state 06/22/2007  . PVC (premature ventricular  contraction) 06/22/2007    Past Surgical History:  Procedure Laterality Date  . CESAREAN SECTION     X 2, Dr Garwin Brothers  . TUBAL LIGATION    . WISDOM TOOTH EXTRACTION       OB History   No obstetric history on file.      Home Medications    Prior to Admission medications   Medication Sig Start Date End Date Taking? Authorizing Provider  aspirin 81 MG tablet Take 81 mg by mouth daily. 1 by mouth 2 x weekly    [provider]  Cholecalciferol (VITAMIN D3) 10000 units TABS Take 1 tablet by mouth daily. 12/23/16   Binnie Rail, MD  fluconazole (DIFLUCAN) 100 MG tablet Take 100 mg by mouth daily. Take one tablet 1 week prior to menstrual cycle.    [provider]  FLUoxetine (PROZAC) 10 MG tablet Take 0.5 tablets (5 mg total) by mouth at bedtime. 09/19/18   Leamon Arnt, MD  Magnesium Glycinate POWD Takes daily 12/23/16   Binnie Rail, MD  metoprolol succinate (TOPROL-XL) 25 MG 24 hr tablet Take 1 tablet (25 mg total) by mouth daily. 12/12/18   Brunetta Jeans, PA-C  Multiple Vitamin (MULTIVITAMIN) tablet Take 1 tablet by mouth daily.      [provider]  Serotonin HCl POWD Take 1 capsule by mouth at bedtime. Seratrex    [provider]  triamcinolone ointment (KENALOG) 0.5 % Apply 1 application topically 2 (two) times daily.  08/26/18   Brunetta Jeans, PA-C    Family History Family History  Problem Relation Age of Onset  . Asthma Mother   . Stroke Father 28  . Colon cancer Father   . Diabetes Sister   . Hypertension Brother   . Transient ischemic attack Brother 41  . Asthma Maternal Grandmother        died @ 42 of status asthmaticus  . Heart disease Neg Hx     Social History Social History   Tobacco Use  . Smoking status: Never Smoker  . Smokeless tobacco: Never Used  Substance Use Topics  . Alcohol use: No  . Drug use: No     Allergies   Cephalexin and Penicillins   Review of Systems Review of Systems  Constitutional:  Positive for fatigue. Negative for activity change, appetite change and fever.  HENT: Negative for congestion.   Respiratory: Negative for cough, chest tightness and shortness of breath.   Gastrointestinal: Negative for abdominal pain, nausea and vomiting.  Genitourinary: Positive for vaginal bleeding. Negative for dysuria and vaginal pain.  Musculoskeletal: Negative for arthralgias, back pain and myalgias.  Neurological: Positive for weakness and light-headedness. Negative for dizziness.    all other systems are negative except as noted in the HPI and PMH.    Physical Exam Updated Vital Signs BP (!) 147/77 (BP Location: Right Arm)   Pulse 86   Temp 97.9 F (36.6 C) (Oral)   Resp 14   SpO2 100%   Physical Exam Vitals signs and nursing note reviewed.  Constitutional:      General: She is not in acute distress.    Appearance: She is well-developed.  HENT:     Head: Normocephalic and atraumatic.     Mouth/Throat:     Pharynx: No oropharyngeal exudate.  Eyes:     Conjunctiva/sclera: Conjunctivae normal.     Pupils: Pupils are equal, round, and reactive to light.     Comments: Mildly pale conjunctiva  Neck:     Musculoskeletal: Normal range of motion and neck supple.     Comments: No meningismus. Cardiovascular:     Rate and Rhythm: Normal rate and regular rhythm.     Heart sounds: Normal heart sounds. No murmur.  Pulmonary:     Effort: Pulmonary effort is normal. No respiratory distress.     Breath sounds: Normal breath sounds.  Abdominal:     Palpations: Abdomen is soft.     Tenderness: There is no abdominal tenderness. There is no guarding or rebound.  Genitourinary:    Comments: Chaperone present.  Normal external genitalia.  Dark blood in vaginal vault.  Small clots present.  Cervix is fingertip dilated.  There is no CMT or adnexal tenderness. Musculoskeletal: Normal range of motion.        General: No tenderness.  Skin:    General: Skin is warm.  Neurological:      Mental Status: She is alert and oriented to person, place, and time.     Cranial Nerves: No cranial nerve deficit.     Motor: No abnormal muscle tone.     Coordination: Coordination normal.     Comments: No ataxia on finger to nose bilaterally. No pronator drift. 5/5 strength throughout. CN 2-12 intact.Equal grip strength. Sensation intact.   Psychiatric:        Behavior: Behavior normal.      ED Treatments / Results  Labs (all labs ordered are listed, but only abnormal results are displayed) Labs Reviewed  WET PREP, GENITAL - Abnormal; Notable for the following components:      Result Value   WBC, Wet Prep HPF POC MANY (*)    All other components within normal limits  CBC  PROTIME-INR  I-STAT BETA HCG BLOOD, ED ([facility], [facility], [facility] ONLY)  TYPE AND SCREEN  ABO/RH    EKG None  Radiology US Transvaginal Non-ob  Result Date: 12/15/2018 CLINICAL DATA:  Acute onset of vaginal bleeding. EXAM: TRANSABDOMINAL AND TRANSVAGINAL ULTRASOUND OF PELVIS TECHNIQUE: Both transabdominal and transvaginal ultrasound examinations of the pelvis were performed. Transabdominal technique was performed for global imaging of the pelvis including uterus, ovaries, adnexal regions, and pelvic cul-de-sac. It was necessary to proceed with endovaginal exam following the transabdominal exam to visualize the endometrium and ovaries in greater detail. COMPARISON:  None FINDINGS: Uterus Measurements: 8.1 x 5.6 x 6.4 cm = volume: 155.1 mL. Multiple uterine fibroids are seen, measuring up to 2.6 cm in size. The uterus is retroflexed in nature. Endometrium Thickness: 0.4 cm.  No focal abnormality visualized. Right ovary Measurements: 2.3 x 1.5 x 1.6 cm = volume: 3.3 mL. Normal appearance/no adnexal mass. Left ovary Measurements: 2.4 x 1.4 x 1.3 cm = volume: 2.0 mL. Normal appearance/no adnexal mass. Other findings No abnormal free fluid. IMPRESSION: 1. Multiple uterine fibroids noted. 2. Otherwise unremarkable pelvic ultrasound.  Electronically Signed   By: Garald Balding M.D.   On: 12/15/2018 02:30   US Pelvis Complete  Result Date: 12/15/2018 CLINICAL DATA:  Acute onset of vaginal bleeding. EXAM: TRANSABDOMINAL AND TRANSVAGINAL ULTRASOUND OF PELVIS TECHNIQUE: Both transabdominal and transvaginal ultrasound examinations of the pelvis were performed. Transabdominal technique was performed for global imaging of the pelvis including uterus, ovaries, adnexal regions, and pelvic cul-de-sac. It was necessary to proceed with endovaginal exam following the transabdominal exam to visualize the endometrium and ovaries in greater detail. COMPARISON:  None FINDINGS: Uterus Measurements: 8.1 x 5.6 x 6.4 cm = volume: 155.1 mL. Multiple uterine fibroids are seen, measuring up to 2.6 cm in size. The uterus is retroflexed in nature. Endometrium Thickness: 0.4 cm.  No focal abnormality visualized. Right ovary Measurements: 2.3 x 1.5 x 1.6 cm = volume: 3.3 mL. Normal appearance/no adnexal mass. Left ovary Measurements: 2.4 x 1.4 x 1.3 cm = volume: 2.0 mL. Normal appearance/no adnexal mass. Other findings No abnormal free fluid. IMPRESSION: 1. Multiple uterine fibroids noted. 2. Otherwise unremarkable pelvic ultrasound. Electronically Signed   By: Garald Balding M.D.   On: 12/15/2018 02:30    Procedures Procedures (including critical care time)  Medications Ordered in ED Medications  sodium chloride 0.9 % bolus 1,000 mL (has no administration in time range)     Initial Impression / Assessment and Plan / ED Course  I have reviewed the triage vital signs and the nursing notes.  Pertinent labs & imaging results that were available during my care of the patient were reviewed by me and considered in my medical decision making (see chart for details).    Vaginal bleeding with lower abdominal cramping onset 2 days ago.  Vitals are stable. Abdomen soft.   Hemoglobin is 12.  Orthostatics are negative.  Patient with mild vaginal bleeding on  exam with some clots.  Ultrasound will be obtained to assess her structure of her uterus.  Ultrasound shows multiple fibroids in the uterus.  Endometrial stripe is normal.  Patient remains hemodynamically stable.  Discussed with Dr. Elonda Husky of gynecology.  Will start Megace 120 mg daily for 5 days and 80  mg daily for 5 days.  Patient remains hemodynamically stable.  She does not need a transfusion at this point. She understands the plan to use Megace and follow-up with gynecology.  She will return to the ED with worsening bleeding, pain, dizziness, lightheadedness or any other concerns.  BP 136/80   Pulse 73   Temp 97.9 F (36.6 C) (Oral)   Resp 14   SpO2 100%   Final Clinical Impressions(s) / ED Diagnoses   Final diagnoses:  Abnormal uterine bleeding (AUB)    ED Discharge Orders    None       Chyane Greer, Annie Main, MD 12/15/18 [phone]

## 2018-12-15 NOTE — Discharge Instructions (Addendum)
Take the Megace as prescribed.  Follow-up with a gynecologist.  Return to the ED with worsening pain, dizziness, bleeding or any other concerns.

## 2018-12-15 NOTE — ED Notes (Signed)
Nurse starting IV and will draw labs. 

## 2018-12-15 NOTE — ED Notes (Signed)
Patient transported to US 

## 2018-12-15 NOTE — ED Notes (Signed)
Patient verbalizes understanding of discharge instructions. Opportunity for questioning and answers were provided. Armband removed by staff, pt discharged from ED ambulatory with husband.  

## 2019-01-02 DIAGNOSIS — D251 Intramural leiomyoma of uterus: Secondary | ICD-10-CM | POA: Diagnosis not present

## 2019-01-02 DIAGNOSIS — N920 Excessive and frequent menstruation with regular cycle: Secondary | ICD-10-CM | POA: Diagnosis not present

## 2019-01-03 DIAGNOSIS — E279 Disorder of adrenal gland, unspecified: Secondary | ICD-10-CM | POA: Diagnosis not present

## 2019-01-03 DIAGNOSIS — E7889 Other lipoprotein metabolism disorders: Secondary | ICD-10-CM | POA: Diagnosis not present

## 2019-01-03 DIAGNOSIS — E039 Hypothyroidism, unspecified: Secondary | ICD-10-CM | POA: Diagnosis not present

## 2019-01-03 DIAGNOSIS — R5383 Other fatigue: Secondary | ICD-10-CM | POA: Diagnosis not present

## 2019-01-03 DIAGNOSIS — Z8249 Family history of ischemic heart disease and other diseases of the circulatory system: Secondary | ICD-10-CM | POA: Diagnosis not present

## 2019-01-05 ENCOUNTER — Ambulatory Visit: Payer: 59 | Admitting: Physician Assistant

## 2019-01-05 ENCOUNTER — Other Ambulatory Visit: Payer: Self-pay

## 2019-01-05 ENCOUNTER — Encounter: Payer: Self-pay | Admitting: Physician Assistant

## 2019-01-05 VITALS — BP 116/80 | HR 75 | Temp 98.9°F | Resp 14 | Ht 68.0 in | Wt 183.0 lb

## 2019-01-05 DIAGNOSIS — B029 Zoster without complications: Secondary | ICD-10-CM

## 2019-01-05 MED ORDER — VALACYCLOVIR HCL 1 G PO TABS: 1000.0000 mg | ORAL_TABLET | Freq: Three times a day (TID) | ORAL | 0 refills | Status: DC

## 2019-01-05 NOTE — Progress Notes (Signed)
Patient presents to clinic today c/o < 48 hours of skin sensitivity and a lesion of R shoulder that is itching and burning. Has a history of shingles and states this feels similar. Wanted assessed ASAP. Denies change to soaps/lotions/detergents. Denies injury to the area. Denies fever, chills, malaise or fatigue.  Past Medical History:  Diagnosis Date  . Anxiety   . Gilbert syndrome   . Herpes exposure    Type 1 & 2 ; Dr Garwin Brothers, [place]  . Hyperlipidemia   . Hypertension   . Premature ventricular contractions     Current Outpatient Medications on File Prior to Visit  Medication Sig Dispense Refill  . aspirin 81 MG tablet Take 81 mg by mouth daily. 1 by mouth 2 x weekly    . Cholecalciferol (VITAMIN D3) 10000 units TABS Take 1 tablet by mouth daily.    Marland Kitchen FLUoxetine (PROZAC) 10 MG tablet Take 0.5 tablets (5 mg total) by mouth at bedtime. 60 tablet 0  . Magnesium Glycinate POWD Takes daily  0  . metoprolol succinate (TOPROL-XL) 25 MG 24 hr tablet Take 1 tablet (25 mg total) by mouth daily. 90 tablet 1  . Multiple Vitamin (MULTIVITAMIN) tablet Take 1 tablet by mouth daily.      . Serotonin HCl POWD Take 1 capsule by mouth at bedtime. Seratrex    . triamcinolone ointment (KENALOG) 0.5 % Apply 1 application topically 2 (two) times daily. 30 g 0   No current facility-administered medications on file prior to visit.     Allergies  Allergen Reactions  . Cephalexin      03/2012 Dyspnea  . Penicillins     Unknown reaction as child    Family History  Problem Relation Age of Onset  . Asthma Mother   . Stroke Father 71  . Colon cancer Father   . Diabetes Sister   . Hypertension Brother   . Transient ischemic attack Brother 41  . Asthma Maternal Grandmother        died @ 48 of status asthmaticus  . Heart disease Neg Hx     Social History   Socioeconomic History  . Marital status: Married    Spouse name: Not on file  . Number of children: Not on file  . Years of education: Not  on file  . Highest education level: Not on file  Occupational History  . Not on file  Social Needs  . Financial resource strain: Not on file  . Food insecurity:    Worry: Not on file    Inability: Not on file  . Transportation needs:    Medical: Not on file    Non-medical: Not on file  Tobacco Use  . Smoking status: Never Smoker  . Smokeless tobacco: Never Used  Substance and Sexual Activity  . Alcohol use: No  . Drug use: No  . Sexual activity: Not on file  Lifestyle  . Physical activity:    Days per week: Not on file    Minutes per session: Not on file  . Stress: Not on file  Relationships  . Social connections:    Talks on phone: Not on file    Gets together: Not on file    Attends religious service: Not on file    Active member of club or organization: Not on file    Attends meetings of clubs or organizations: Not on file    Relationship status: Not on file  Other Topics Concern  . Not on file  Social History Narrative  . Not on file    Review of Systems - See HPI.  All other ROS are negative.  BP 116/80   Pulse 75   Temp 98.9 F (37.2 C) (Oral)   Resp 14   Ht 5\' 8"  (1.727 m)   Wt 183 lb (83 kg)   SpO2 99%   BMI 27.83 kg/m   Physical Exam Constitutional:      Appearance: Normal appearance.  Neck:     Musculoskeletal: Neck supple.  Cardiovascular:     Rate and Rhythm: Normal rate and regular rhythm.     Heart sounds: Normal heart sounds.  Pulmonary:     Effort: Pulmonary effort is normal.  Skin:      Neurological:     Mental Status: She is alert.     Recent Results (from the past 2160 hour(s))  CBC     Status: None   Collection Time: 12/14/18 10:41 PM  Result Value Ref Range   WBC 7.7 4.0 - 10.5 K/uL   RBC 4.53 3.87 - 5.11 MIL/uL   Hemoglobin 12.2 12.0 - 15.0 g/dL   HCT 38.7 36.0 - 46.0 %   MCV 85.4 80.0 - 100.0 fL   MCH 26.9 26.0 - 34.0 pg   MCHC 31.5 30.0 - 36.0 g/dL   RDW 14.5 11.5 - 15.5 %   Platelets 326 150 - 400 K/uL   nRBC  0.0 0.0 - 0.2 %    Comment: Performed at [facility], [place] [address], [place], [place] [address]  Type and screen [place]     Status: None   Collection Time: 12/14/18 10:41 PM  Result Value Ref Range   ABO/RH(D) O POS    Antibody Screen NEG    Sample Expiration      12/17/2018 Performed at [facility], [place] [address] [place], [place] [address]   ABO/Rh     Status: None   Collection Time: 12/14/18 10:41 PM  Result Value Ref Range   ABO/RH(D)      O POS Performed at [facility] [address], [place], [place] [address]   I-Stat beta hCG blood, ED     Status: None   Collection Time: 12/14/18 10:48 PM  Result Value Ref Range   I-stat hCG, quantitative <5.0 <5 mIU/mL   Comment 3            Comment:   GEST. AGE      CONC.  (mIU/mL)   <=1 WEEK        5 - 50     2 WEEKS       50 - 500     3 WEEKS       100 - 10,000     4 WEEKS     1,000 - 30,000        FEMALE AND NON-PREGNANT FEMALE:     LESS THAN 5 mIU/mL   Wet prep, genital     Status: Abnormal   Collection Time: 12/15/18 12:53 AM  Result Value Ref Range   Yeast Wet Prep HPF POC NONE SEEN NONE SEEN   Trich, Wet Prep NONE SEEN NONE SEEN   Clue Cells Wet Prep HPF POC NONE SEEN NONE SEEN   WBC, Wet Prep HPF POC MANY (A) NONE SEEN   Sperm NONE SEEN     Comment: Performed at [facility], [place] [address] [place], [place] [address]  Protime-INR  Status: None   Collection Time: 12/15/18  1:12 AM  Result Value Ref Range   Prothrombin Time 12.9 11.4 - 15.2 seconds   INR 0.98     Comment: Performed at [facility] [address], [place], [place] [address]    Assessment/Plan: 1. Herpes zoster without complication Supportive measures and OTC medications reviewed. Keep covered. Start Valtrex TID z 7 days.  - valACYclovir (VALTREX) 1000 MG tablet; Take 1 tablet (1,000 mg total) by mouth 3 (three) times daily.  Dispense: 21 tablet; Refill: 0   Leeanne Rio, PA-C

## 2019-01-05 NOTE — Patient Instructions (Addendum)
Please take the Valtrex as directed, three times daily for 7 days.  Keep the area covered as you can transmit chicken pox virus to others who have not had it. Tylenol for pain.  Lidocaine ointment or Sarna for itch and burning.  Call me if you note any new or worsening symptoms.    Shingles  Shingles is an infection. It gives you a painful skin rash and blisters that have fluid in them. Shingles is caused by the same germ (virus) that causes chickenpox. Shingles only happens in people who:  Have had chickenpox.  Have been given a shot of medicine (vaccine) to protect against chickenpox. Shingles is rare in this group. The first symptoms of shingles may be itching, tingling, or pain in an area on your skin. A rash will show on your skin a few days or weeks later. The rash is likely to be on one side of your body. The rash usually has a shape like a belt or a band. Over time, the rash turns into fluid-filled blisters. The blisters will break open, change into scabs, and dry up. Medicines may:  Help with pain and itching.  Help you get better sooner.  Help to prevent long-term problems. Follow these instructions at home: Medicines  Take over-the-counter and prescription medicines only as told by your doctor.  Put on an anti-itch cream or numbing cream where you have a rash, blisters, or scabs. Do this as told by your doctor. Helping with itching and discomfort   Put cold, wet cloths (cold compresses) on the area of the rash or blisters as told by your doctor.  Cool baths can help you feel better. Try adding baking soda or dry oatmeal to the water to lessen itching. Do not bathe in hot water. Blister and rash care  Keep your rash covered with a loose bandage (dressing).  Wear loose clothing that does not rub on your rash.  Keep your rash and blisters clean. To do this, wash the area with mild soap and cool water as told by your doctor.  Check your rash every day for signs of  infection. Check for: ? More redness, swelling, or pain. ? Fluid or blood. ? Warmth. ? Pus or a bad smell.  Do not scratch your rash. Do not pick at your blisters. To help you to not scratch: ? Keep your fingernails clean and cut short. ? Wear gloves or mittens when you sleep, if scratching is a problem. General instructions  Rest as told by your doctor.  Keep all follow-up visits as told by your doctor. This is important.  Wash your hands often with soap and water. If soap and water are not available, use hand sanitizer. Doing this lowers your chance of getting a skin infection caused by germs (bacteria).  Your infection can cause chickenpox in people who have never had chickenpox or never got a shot of chickenpox vaccine. If you have blisters that did not change into scabs yet, try not to touch other people or be around other people, especially: ? Babies. ? Pregnant women. ? Children who have areas of red, itchy, or rough skin (eczema). ? Very old people who have transplants. ? People who have a long-term (chronic) sickness, like cancer or AIDS. Contact a doctor if:  Your pain does not get better with medicine.  Your pain does not get better after the rash heals.  You have any signs of infection in the rash area. These signs include: ? More redness,  swelling, or pain around the rash. ? Fluid or blood coming from the rash. ? The rash area feeling warm to the touch. ? Pus or a bad smell coming from the rash. Get help right away if:  The rash is on your face or nose.  You have pain in your face or pain by your eye.  You lose feeling on one side of your face.  You have trouble seeing.  You have ear pain, or you have ringing in your ear.  You have a loss of taste.  Your condition gets worse. Summary  Shingles gives you a painful skin rash and blisters that have fluid in them.  Shingles is an infection. It is caused by the same germ (virus) that causes  chickenpox.  Keep your rash covered with a loose bandage (dressing). Wear loose clothing that does not rub on your rash.  If you have blisters that did not change into scabs yet, try not to touch other people or be around people. This information is not intended to replace advice given to you by your health care provider. Make sure you discuss any questions you have with your health care provider. Document Released: 05/25/2008 Document Revised: 08/11/2017 Document Reviewed: 08/11/2017 Elsevier Interactive Patient Education  2019 [employer].

## 2019-01-06 ENCOUNTER — Ambulatory Visit: Payer: 59 | Admitting: Physician Assistant

## 2019-01-12 ENCOUNTER — Encounter: Payer: Self-pay | Admitting: Emergency Medicine

## 2019-01-24 ENCOUNTER — Encounter: Payer: 59 | Admitting: Physician Assistant

## 2019-01-26 ENCOUNTER — Other Ambulatory Visit: Payer: Self-pay | Admitting: Family Medicine

## 2019-02-17 ENCOUNTER — Encounter: Payer: 59 | Admitting: Physician Assistant

## 2019-03-31 ENCOUNTER — Encounter: Payer: Self-pay | Admitting: Physician Assistant

## 2019-04-03 ENCOUNTER — Ambulatory Visit (INDEPENDENT_AMBULATORY_CARE_PROVIDER_SITE_OTHER): Payer: 59 | Admitting: Physician Assistant

## 2019-04-03 ENCOUNTER — Encounter: Payer: Self-pay | Admitting: Physician Assistant

## 2019-04-03 ENCOUNTER — Other Ambulatory Visit: Payer: Self-pay

## 2019-04-03 VITALS — BP 128/70 | HR 82 | Temp 98.8°F | Ht 68.0 in | Wt 175.0 lb

## 2019-04-03 DIAGNOSIS — B354 Tinea corporis: Secondary | ICD-10-CM

## 2019-04-03 MED ORDER — CLOTRIMAZOLE-BETAMETHASONE 1-0.05 % EX CREA: 1.0000 "application " | TOPICAL_CREAM | Freq: Two times a day (BID) | CUTANEOUS | 0 refills | Status: DC

## 2019-04-03 NOTE — Patient Instructions (Addendum)
Instructions sent to MyChart.   Please keep skin clean and dry.  Avoid touching the area. Apply the Lotrisone (Clotrimazole-Betamethasone) cream twice daily as directed for the next 7-14 days until rash clears and for a few days after. Message me if this is not improving!  Hang in there!

## 2019-04-03 NOTE — Telephone Encounter (Signed)
Please call patient to set up video visit

## 2019-04-03 NOTE — Progress Notes (Signed)
Virtual Visit via Video Note I connected with [name] on 04/03/19 at 11:30 AM EDT by a video enabled telemedicine application and verified that I am speaking with the correct person using two identifiers.   I discussed the limitations of evaluation and management by telemedicine and the availability of in person appointments. The patient expressed understanding and agreed to proceed.  History of Present Illness: Patient presents via [employer] today for assessment of a rash. Patient endorses pruritic lesions of 3 sites - Lesion of L Hip present since 03/08/2019. Other two --  L shoulder and R buttock x 2 days.Patient notes some pruritus but denies any pain. Denies change in size or characteristics since onset. Notes rash is annular, oval shaped.  Denies change in soaps/lotions or detergents. Has been applying cold compresses and Witch Hazel which help tremendously.   Observations/Objective: Patient is well-developed, well-nourished in no acute distress.  Resting comfortably in chair at home.  Head is normocephalic, atraumatic.  No labored breathing.  Speech is clear and coherent with logical contest.  Patient is alert and oriented at baseline.  L shoulder examined via video with help of patient and her son. There is about a 1 cm annular lesion with raised erythematous borders and central shining with some scaling. Similar 2 cm lesion of hip noted. Buttock lesion not examined during this visit but patient notes is the same.  Assessment and Plan: 1. Tinea corporis 3 areas. Supportive measures reviewed with patient. Begin Lotrisone BID until rash clears and for a few days after to prevent recurrence - clotrimazole-betamethasone (LOTRISONE) cream; Apply 1 application topically 2 (two) times daily.  Dispense: 30 g; Refill: 0   Follow Up Instructions: Follow-up if symptoms are not resolving.    I discussed the assessment and treatment plan with the patient. The patient was provided an  opportunity to ask questions and all were answered. The patient agreed with the plan and demonstrated an understanding of the instructions.   The patient was advised to call back or seek an in-person evaluation if the symptoms worsen or if the condition fails to improve as anticipated.   Leeanne Rio, PA-C

## 2019-04-03 NOTE — Telephone Encounter (Signed)
Called patient and LMOVM to call back to schedule an Virtual visit with [facility] for the rash

## 2019-04-03 NOTE — Progress Notes (Signed)
I have discussed the procedure for the virtual visit with the patient who has given consent to proceed with assessment and treatment.   Luciana Cammarata S Cyndia Degraff, CMA     

## 2019-04-05 ENCOUNTER — Encounter: Payer: Self-pay | Admitting: Physician Assistant

## 2019-04-05 ENCOUNTER — Ambulatory Visit (INDEPENDENT_AMBULATORY_CARE_PROVIDER_SITE_OTHER): Payer: 59 | Admitting: Physician Assistant

## 2019-04-05 ENCOUNTER — Other Ambulatory Visit: Payer: Self-pay

## 2019-04-05 VITALS — BP 119/76 | HR 79 | Ht 68.0 in | Wt 175.0 lb

## 2019-04-05 DIAGNOSIS — F411 Generalized anxiety disorder: Secondary | ICD-10-CM

## 2019-04-05 DIAGNOSIS — I10 Essential (primary) hypertension: Secondary | ICD-10-CM

## 2019-04-05 MED ORDER — FLUOXETINE HCL 20 MG PO TABS: 20.0000 mg | ORAL_TABLET | Freq: Every day | ORAL | 3 refills | Status: DC

## 2019-04-05 MED ORDER — ALPRAZOLAM 0.25 MG PO TABS: 0.2500 mg | ORAL_TABLET | Freq: Two times a day (BID) | ORAL | 0 refills | Status: DC | PRN

## 2019-04-05 NOTE — Telephone Encounter (Signed)
Please schedule patient for video visit -- anxiety. Thank you.

## 2019-04-05 NOTE — Progress Notes (Signed)
Virtual Visit via Video   I connected with [name] on 04/05/19 at  9:00 AM EDT by a video enabled telemedicine application and verified that I am speaking with the correct person using two identifiers.  Location patient: Home Location provider: [facility], Office Persons participating in the virtual visit: Patient, Provider, [place] (Patina Moore)  I discussed the limitations of evaluation and management by telemedicine and the availability of in person appointments. The patient expressed understanding and agreed to proceed.  Subjective:   HPI:   Patient presents today to discuss potential anxiety and increase in BP last night. Patient endorses having a good day yesterday. Ate dinner after which she noted some mild bloating and felt a little sleepy. Denies abdominal pain, nausea or vomiting at that time. Denies any heart burn or indigestion. Notes she rode her exercise bike for 30+ minutes around 7:30, then showered, took her medicine and went to bed around 9:00. Notes she woke up at 10:30 feeling anxious and with her heart racing. Checked pulse and noted it to be at 111. Denies chest pain, lightheadedness, dizziness or SOB at that time. Checked her BP and noted it to be 160/90. Had husband call EMS as she was worried due to her racing heart. Noted EMS came to the house and assessed. BP at that time initially 200/105, EKG performed revealing sinus tachycardia (tracing reviewed in MyChart message). BP checked after EKG was at 150/90. She refused ER assessment and notes that EMS felt it was likely anxiety-related. Notes she felt fine after this and went to bed. This morning she feels great. No recurrence of symptoms. BP at 119/76 this morning. Heart rate at 79. Would like to discuss change in her anxiety medication regimen.  ROS: See pertinent positives and negatives per HPI.  Patient Active Problem List   Diagnosis Date Noted  . Visit for preventive health examination 01/21/2018   . Skin cysts, generalized 01/21/2018  . Dermatitis 10/15/2017  . Vitamin D deficiency 12/23/2016  . Vitamin B12 deficiency 12/23/2016  . Fibrocystic breast disease 12/23/2016  . Seasonal allergic rhinitis 06/05/2013  . Mitral regurgitation 06/02/2011  . Hyperlipidemia 11/14/2008  . Hypertension 07/13/2008  . GILBERT'S SYNDROME 06/22/2007  . Anxiety state 06/22/2007  . PVC (premature ventricular contraction) 06/22/2007    Social History   Tobacco Use  . Smoking status: Never Smoker  . Smokeless tobacco: Never Used  Substance Use Topics  . Alcohol use: No    Current Outpatient Medications:  .  aspirin 81 MG tablet, Take 81 mg by mouth daily. 1 by mouth 2 x weekly, Disp: , Rfl:  .  Cholecalciferol (VITAMIN D3) 10000 units TABS, Take 1 tablet by mouth daily., Disp: , Rfl:  .  clotrimazole-betamethasone (LOTRISONE) cream, Apply 1 application topically 2 (two) times daily., Disp: 30 g, Rfl: 0 .  FLUoxetine (PROZAC) 10 MG tablet, TAKE 1/2 TABLET BY MOUTH AT BEDTIME, Disp: 60 tablet, Rfl: 0 .  Magnesium Glycinate POWD, Takes daily, Disp: , Rfl: 0 .  metoprolol succinate (TOPROL-XL) 25 MG 24 hr tablet, Take 1 tablet (25 mg total) by mouth daily., Disp: 90 tablet, Rfl: 1 .  Multiple Vitamin (MULTIVITAMIN) tablet, Take 1 tablet by mouth daily.  , Disp: , Rfl:  .  Serotonin HCl POWD, Take 1 capsule by mouth at bedtime. Seratrex, Disp: , Rfl:  .  triamcinolone ointment (KENALOG) 0.5 %, Apply 1 application topically 2 (two) times daily., Disp: 30 g, Rfl: 0  Allergies  Allergen Reactions  .  Cephalexin      03/2012 Dyspnea  . Penicillins     Unknown reaction as child    Objective:   BP 119/76   Pulse 79   Ht 5\' 8"  (1.727 m)   Wt 175 lb (79.4 kg)   BMI 26.61 kg/m   Patient is well-developed, well-nourished in no acute distress.  Resting comfortably at home.  Head is normocephalic, atraumatic.  No labored breathing.  Speech is clear and coherent with logical contest.  Patient is  alert and oriented at baseline.  Anxious affect.  Assessment and Plan:    1. Essential hypertension BP stable today. ELG reviewed and without concerning findings, just mild sinus tachycardia. Suspect episode of hypertension last night secondary to anxiety/panic attack. She is under a lot of stress. Will continue current antihypertensive regimen. Adding on Alprazolam PRN and Increase Fluoxetine to 20 mg daily. Close follow-up scheduled. Strict ER precautions reviewed with patient Written list of instructions sent to MyChart.   2. Anxiety state Start Alprazolam 0.25 mg PRN for acute anxiety and for recurrent episode of panic. Increase Fluoxetine to 20 mg daily. Close follow-up scheduled.  - FLUoxetine (PROZAC) 20 MG tablet; Take 1 tablet (20 mg total) by mouth daily.  Dispense: 30 tablet; Refill: 3 - ALPRAZolam (XANAX) 0.25 MG tablet; Take 1 tablet (0.25 mg total) by mouth 2 (two) times daily as needed for anxiety.  Dispense: 20 tablet; Refill: 0   Leeanne Rio, PA-C 04/05/2019  Time spent with the patient: 25 minutes, of which >50% was spent in obtaining information about symptoms, reviewing previous labs, evaluations, and treatments, counseling about condition (please see the discussed topics above), and developing a plan to further investigate it; had a number of questions which I addressed.

## 2019-04-05 NOTE — Progress Notes (Signed)
I have discussed the procedure for the virtual visit with the patient who has given consent to proceed with assessment and treatment.   Chalee Hirota S Magally Vahle, CMA     

## 2019-04-05 NOTE — Patient Instructions (Signed)
Instructions sent to MyChart.   Increase the Fluoxetine to 20 mg daily. A new prescription has been sent in. Keep up with exercise to help with stress relief. I also recommend you download the Calm app on your phone and begin some of these exercises. The Alprazolam is for acute anxiety and to take if there is a recurrence of symptoms last night. If this does not calm it down, or if you note any chest pain, lightheadedness or dizziness, please call 911. Keep close eye on BP and continue BP medications as directed. We will follow-up in 2 weeks via Video visit. Sooner if needed.

## 2019-04-28 ENCOUNTER — Other Ambulatory Visit: Payer: Self-pay | Admitting: Physician Assistant

## 2019-06-01 ENCOUNTER — Encounter: Payer: Self-pay | Admitting: Physician Assistant

## 2019-06-06 ENCOUNTER — Telehealth: Payer: Self-pay | Admitting: *Deleted

## 2019-06-06 ENCOUNTER — Other Ambulatory Visit: Payer: Self-pay

## 2019-06-06 ENCOUNTER — Ambulatory Visit (INDEPENDENT_AMBULATORY_CARE_PROVIDER_SITE_OTHER): Payer: 59 | Admitting: Physician Assistant

## 2019-06-06 ENCOUNTER — Ambulatory Visit: Payer: Self-pay

## 2019-06-06 ENCOUNTER — Encounter: Payer: Self-pay | Admitting: Physician Assistant

## 2019-06-06 VITALS — BP 138/82 | HR 91 | Temp 97.9°F | Resp 16 | Ht 69.0 in | Wt 172.8 lb

## 2019-06-06 DIAGNOSIS — J302 Other seasonal allergic rhinitis: Secondary | ICD-10-CM | POA: Diagnosis not present

## 2019-06-06 DIAGNOSIS — I10 Essential (primary) hypertension: Secondary | ICD-10-CM

## 2019-06-06 DIAGNOSIS — Z0000 Encounter for general adult medical examination without abnormal findings: Secondary | ICD-10-CM | POA: Diagnosis not present

## 2019-06-06 DIAGNOSIS — E7800 Pure hypercholesterolemia, unspecified: Secondary | ICD-10-CM | POA: Diagnosis not present

## 2019-06-06 DIAGNOSIS — F411 Generalized anxiety disorder: Secondary | ICD-10-CM

## 2019-06-06 DIAGNOSIS — E538 Deficiency of other specified B group vitamins: Secondary | ICD-10-CM | POA: Diagnosis not present

## 2019-06-06 DIAGNOSIS — Z1239 Encounter for other screening for malignant neoplasm of breast: Secondary | ICD-10-CM

## 2019-06-06 DIAGNOSIS — E559 Vitamin D deficiency, unspecified: Secondary | ICD-10-CM | POA: Diagnosis not present

## 2019-06-06 LAB — COMPREHENSIVE METABOLIC PANEL
ALT: 11 U/L (ref 0–35)
AST: 14 U/L (ref 0–37)
Albumin: 4.3 g/dL (ref 3.5–5.2)
Alkaline Phosphatase: 74 U/L (ref 39–117)
BUN: 8 mg/dL (ref 6–23)
CO2: 26 mEq/L (ref 19–32)
Calcium: 9 mg/dL (ref 8.4–10.5)
Chloride: 104 mEq/L (ref 96–112)
Creatinine, Ser: 0.82 mg/dL (ref 0.40–1.20)
GFR: 90.2 mL/min (ref >60.00)
Glucose, Bld: 87 mg/dL (ref 70–99)
Potassium: 4.1 mEq/L (ref 3.5–5.1)
Sodium: 137 mEq/L (ref 135–145)
Total Bilirubin: 0.8 mg/dL (ref 0.2–1.2)
Total Protein: 6.6 g/dL (ref 6.0–8.3)

## 2019-06-06 LAB — CBC WITH DIFFERENTIAL/PLATELET
Basophils Absolute: 0.1 10*3/uL (ref 0.0–0.1)
Basophils Relative: 1.1 % (ref 0.0–3.0)
Eosinophils Absolute: 0 10*3/uL (ref 0.0–0.7)
Eosinophils Relative: 1 % (ref 0.0–5.0)
HCT: 38.3 % (ref 36.0–46.0)
Hemoglobin: 12.6 g/dL (ref 12.0–15.0)
Lymphocytes Relative: 23.1 % (ref 12.0–46.0)
Lymphs Abs: 1.1 10*3/uL (ref 0.7–4.0)
MCHC: 32.8 g/dL (ref 30.0–36.0)
MCV: 82.4 fl (ref 78.0–100.0)
Monocytes Absolute: 0.4 10*3/uL (ref 0.1–1.0)
Monocytes Relative: 7.9 % (ref 3.0–12.0)
Neutro Abs: 3.2 10*3/uL (ref 1.4–7.7)
Neutrophils Relative %: 66.9 % (ref 43.0–77.0)
Platelets: 294 10*3/uL (ref 150.0–400.0)
RBC: 4.65 Mil/uL (ref 3.87–5.11)
RDW: 15.8 % — ABNORMAL HIGH (ref 11.5–15.5)
WBC: 4.8 10*3/uL (ref 4.0–10.5)

## 2019-06-06 LAB — LIPID PANEL
Cholesterol: 229 mg/dL — ABNORMAL HIGH (ref 0–200)
HDL: 56.4 mg/dL (ref >39.00)
LDL Cholesterol: 159 mg/dL — ABNORMAL HIGH (ref 0–99)
NonHDL: 172.24
Total CHOL/HDL Ratio: 4
Triglycerides: 64 mg/dL (ref 0.0–149.0)
VLDL: 12.8 mg/dL (ref 0.0–40.0)

## 2019-06-06 LAB — VITAMIN B12: Vitamin B-12: 656 pg/mL (ref 211–911)

## 2019-06-06 LAB — HEMOGLOBIN A1C: Hgb A1c MFr Bld: 6 % (ref 4.6–6.5)

## 2019-06-06 LAB — VITAMIN D 25 HYDROXY (VIT D DEFICIENCY, FRACTURES): VITD: 107.88 ng/mL (ref 30.00–100.00)

## 2019-06-06 LAB — TSH: TSH: 1.36 u[IU]/mL (ref 0.35–4.50)

## 2019-06-06 NOTE — Patient Instructions (Signed)
Please go to the lab for blood work.   Our office will call you with your results unless you have chosen to receive results via MyChart.  If your blood work is normal we will follow-up each year for physicals and as scheduled for chronic medical problems.  If anything is abnormal we will treat accordingly and get you in for a follow-up.  Please continue the soaks in the hot tub even for 10-15 minutes every other night or so when you can. This will help relax those tensed muscles. You can consider a topical Icy Hot to the area.    Preventive Care 40-64 Years, Female Preventive care refers to lifestyle choices and visits with your health care provider that can promote health and wellness. What does preventive care include?   A yearly physical exam. This is also called an annual well check.  Dental exams once or twice a year.  Routine eye exams. Ask your health care provider how often you should have your eyes checked.  Personal lifestyle choices, including: ? Daily care of your teeth and gums. ? Regular physical activity. ? Eating a healthy diet. ? Avoiding tobacco and drug use. ? Limiting alcohol use. ? Practicing safe sex. ? Taking low-dose aspirin daily starting at age 21. ? Taking vitamin and mineral supplements as recommended by your health care provider. What happens during an annual well check? The services and screenings done by your health care provider during your annual well check will depend on your age, overall health, lifestyle risk factors, and family history of disease. Counseling Your health care provider may ask you questions about your:  Alcohol use.  Tobacco use.  Drug use.  Emotional well-being.  Home and relationship well-being.  Sexual activity.  Eating habits.  Work and work Statistician.  Method of birth control.  Menstrual cycle.  Pregnancy history. Screening You may have the following tests or measurements:  Height, weight, and BMI.   Blood pressure.  Lipid and cholesterol levels. These may be checked every 5 years, or more frequently if you are over 74 years old.  Skin check.  Lung cancer screening. You may have this screening every year starting at age 47 if you have a 30-pack-year history of smoking and currently smoke or have quit within the past 15 years.  Colorectal cancer screening. All adults should have this screening starting at age 50 and continuing until age 39. Your health care provider may recommend screening at age 26. You will have tests every 1-10 years, depending on your results and the type of screening test. People at increased risk should start screening at an earlier age. Screening tests may include: ? Guaiac-based fecal occult blood testing. ? Fecal immunochemical test (FIT). ? Stool DNA test. ? Virtual colonoscopy. ? Sigmoidoscopy. During this test, a flexible tube with a tiny camera (sigmoidoscope) is used to examine your rectum and lower colon. The sigmoidoscope is inserted through your anus into your rectum and lower colon. ? Colonoscopy. During this test, a long, thin, flexible tube with a tiny camera (colonoscope) is used to examine your entire colon and rectum.  Hepatitis C blood test.  Hepatitis B blood test.  Sexually transmitted disease (STD) testing.  Diabetes screening. This is done by checking your blood sugar (glucose) after you have not eaten for a while (fasting). You may have this done every 1-3 years.  Mammogram. This may be done every 1-2 years. Talk to your health care provider about when you should start having regular  mammograms. This may depend on whether you have a family history of breast cancer.  BRCA-related cancer screening. This may be done if you have a family history of breast, ovarian, tubal, or peritoneal cancers.  Pelvic exam and Pap test. This may be done every 3 years starting at age 31. Starting at age 75, this may be done every 5 years if you have a Pap  test in combination with an HPV test.  Bone density scan. This is done to screen for osteoporosis. You may have this scan if you are at high risk for osteoporosis. Discuss your test results, treatment options, and if necessary, the need for more tests with your health care provider. Vaccines Your health care provider may recommend certain vaccines, such as:  Influenza vaccine. This is recommended every year.  Tetanus, diphtheria, and acellular pertussis (Tdap, Td) vaccine. You may need a Td booster every 10 years.  Varicella vaccine. You may need this if you have not been vaccinated.  Zoster vaccine. You may need this after age 85.  Measles, mumps, and rubella (MMR) vaccine. You may need at least one dose of MMR if you were born in 1957 or later. You may also need a second dose.  Pneumococcal 13-valent conjugate (PCV13) vaccine. You may need this if you have certain conditions and were not previously vaccinated.  Pneumococcal polysaccharide (PPSV23) vaccine. You may need one or two doses if you smoke cigarettes or if you have certain conditions.  Meningococcal vaccine. You may need this if you have certain conditions.  Hepatitis A vaccine. You may need this if you have certain conditions or if you travel or work in places where you may be exposed to hepatitis A.  Hepatitis B vaccine. You may need this if you have certain conditions or if you travel or work in places where you may be exposed to hepatitis B.  Haemophilus influenzae type b (Hib) vaccine. You may need this if you have certain conditions. Talk to your health care provider about which screenings and vaccines you need and how often you need them. This information is not intended to replace advice given to you by your health care provider. Make sure you discuss any questions you have with your health care provider. Document Released: 01/03/2016 Document Revised: 01/27/2018 Document Reviewed: 10/08/2015 Elsevier Interactive  Patient Education  2019 [employer].

## 2019-06-06 NOTE — Progress Notes (Signed)
Patient presents to clinic today for annual exam.  Patient is fasting for labs.  Diet --main meal is lunch, reports loosing a few pounds, reports having an appetite, cutting back on processed foods and salt intake    Exercise -- Was seeing a personal trainer several times a week but has not been able to continue this recently due to [place] pandemic. Is doing some exercise at home but very little as she feels too tired at the end of the day.  Chronic Issues: Hypertension -- Currently on Toprol XL 25 mg daily. Endorses taking medication as directed. Is checking home BP on a regular basis. Notes readings averaging 115/70-150/94 checking at different times of day. Usually elevated numbers are at night and associated with her levels of anxiety. Reports taking medication as prescribed denies any headaches, dizziness, lightheadedness   Hyperlipidemia -- Managed with diet and activity level.  Dietary changes, decreased consumption of rice  Anxiety -- Patient is currently on a regimen of Fluoxetine 20 mg daily and Alprazolam 0.25 mg as needed. Endorses taking the SSRI daily as directed. Rare use of Xanax, mainly at night for acute anxiety. Notes it will calm her down and BP normalized but sometimes makes her feel sleepy/foggy.Only taking about 2. Sister passed from pneumonia in May.  Denies depressed mood/anhedonia, SI/HI  Health Maintenance: Immunizations -- UTD Mammogram -- Overdue. Denies concerns today. Notes with last Mammogram some years ago she had pain for 2-3 weeks after. As such she has refused mammogram. Was getting Thermal scans from her holistic practitioner which she reports were good. Has records at home which she has been asked to bring into office.  Past Medical History:  Diagnosis Date  . Anxiety   . Gilbert syndrome   . Herpes exposure    Type 1 & 2 ; Dr Garwin Brothers, [place]  . Hyperlipidemia   . Hypertension   . Premature ventricular contractions     Past Surgical History:   Procedure Laterality Date  . CESAREAN SECTION     X 2, Dr Garwin Brothers  . TUBAL LIGATION    . WISDOM TOOTH EXTRACTION      Current Outpatient Medications on File Prior to Visit  Medication Sig Dispense Refill  . ALPRAZolam (XANAX) 0.25 MG tablet Take 1 tablet (0.25 mg total) by mouth 2 (two) times daily as needed for anxiety. 20 tablet 0  . aspirin 81 MG tablet 81 mg. 1 by mouth 2 x weekly    . Cholecalciferol (VITAMIN D3) 10000 units TABS Take 1 tablet by mouth daily.    . clotrimazole-betamethasone (LOTRISONE) cream Apply 1 application topically 2 (two) times daily. 30 g 0  . FLUoxetine (PROZAC) 20 MG tablet Take 1 tablet (20 mg total) by mouth daily. 30 tablet 3  . Magnesium Glycinate POWD Takes daily (Patient taking differently: Take 1 tablet by mouth daily. Takes daily)  0  . metoprolol succinate (TOPROL-XL) 25 MG 24 hr tablet Take 1 tablet (25 mg total) by mouth daily. 90 tablet 1  . Multiple Vitamin (MULTIVITAMIN) tablet Take 1 tablet by mouth daily.      . Serotonin HCl POWD Take 1 capsule by mouth at bedtime. Seratrex    . triamcinolone ointment (KENALOG) 0.5 % APPLY EXTERNALLY TO THE AFFECTED AREA TWICE DAILY 30 g 0   No current facility-administered medications on file prior to visit.     Allergies  Allergen Reactions  . Cephalexin      03/2012 Dyspnea  . Penicillins  Unknown reaction as child    Family History  Problem Relation Age of Onset  . Asthma Mother   . Stroke Father 3  . Colon cancer Father   . Diabetes Sister   . Pneumonia Sister   . Hypertension Brother   . Transient ischemic attack Brother 41  . Asthma Maternal Grandmother        died @ 10 of status asthmaticus  . Heart disease Neg Hx     Social History   Socioeconomic History  . Marital status: Married    Spouse name: Not on file  . Number of children: Not on file  . Years of education: Not on file  . Highest education level: Not on file  Occupational History  . Not on file  Social Needs   . Financial resource strain: Not on file  . Food insecurity    Worry: Not on file    Inability: Not on file  . Transportation needs    Medical: Not on file    Non-medical: Not on file  Tobacco Use  . Smoking status: Never Smoker  . Smokeless tobacco: Never Used  Substance and Sexual Activity  . Alcohol use: No  . Drug use: No  . Sexual activity: Not on file  Lifestyle  . Physical activity    Days per week: Not on file    Minutes per session: Not on file  . Stress: Not on file  Relationships  . Social Herbalist on phone: Not on file    Gets together: Not on file    Attends religious service: Not on file    Active member of club or organization: Not on file    Attends meetings of clubs or organizations: Not on file    Relationship status: Not on file  . Intimate partner violence    Fear of current or ex partner: Not on file    Emotionally abused: Not on file    Physically abused: Not on file    Forced sexual activity: Not on file  Other Topics Concern  . Not on file  Social History Narrative  . Not on file    Review of Systems  Constitutional: Negative for fever and weight loss.  HENT: Negative for ear discharge, ear pain, hearing loss and tinnitus.   Eyes: Negative for blurred vision, double vision, photophobia and pain.  Respiratory: Negative for cough and shortness of breath.   Cardiovascular: Negative for chest pain and palpitations.  Gastrointestinal: Negative for abdominal pain, blood in stool, constipation, diarrhea, heartburn, melena, nausea and vomiting.  Genitourinary: Negative for dysuria, flank pain, frequency, hematuria and urgency.  Musculoskeletal: Positive for back pain. Negative for falls.       Upper back pain   Neurological: Positive for headaches. Negative for dizziness and loss of consciousness.       Associates with wearing a mask   Endo/Heme/Allergies: Negative for environmental allergies.  Psychiatric/Behavioral: Negative for  depression, hallucinations, substance abuse and suicidal ideas. The patient is not nervous/anxious and does not have insomnia.    BP 138/82   Pulse 91   Temp 97.9 F (36.6 C) (Skin)   Resp 16   Ht 5\' 9"  (6.270 m)   Wt 172 lb 12.8 oz (78.4 kg)   SpO2 98%   BMI 25.52 kg/m   Physical Exam Vitals signs reviewed.  HENT:     Head: Normocephalic and atraumatic.     Right Ear: Tympanic membrane, ear canal and external  ear normal.     Left Ear: Tympanic membrane, ear canal and external ear normal.     Nose: Nose normal. No mucosal edema.     Mouth/Throat:     Pharynx: Uvula midline. No oropharyngeal exudate or posterior oropharyngeal erythema.  Eyes:     Conjunctiva/sclera: Conjunctivae normal.     Pupils: Pupils are equal, round, and reactive to light.  Neck:     Musculoskeletal: Neck supple.     Thyroid: No thyromegaly.  Cardiovascular:     Rate and Rhythm: Normal rate and regular rhythm.     Heart sounds: Normal heart sounds.  Pulmonary:     Effort: Pulmonary effort is normal. No respiratory distress.     Breath sounds: Normal breath sounds. No wheezing or rales.  Abdominal:     General: Bowel sounds are normal. There is no distension.     Palpations: Abdomen is soft. There is no mass.     Tenderness: There is no abdominal tenderness. There is no guarding or rebound.  Lymphadenopathy:     Cervical: No cervical adenopathy.  Skin:    General: Skin is warm and dry.     Findings: No rash.  Neurological:     Mental Status: She is alert and oriented to person, place, and time.     Cranial Nerves: No cranial nerve deficit.    Assessment/Plan: 1. Essential hypertension BP stable today. Asymptomatic. Some occasional fluctuations related to anxiety which we are working on. - Comprehensive metabolic panel - Hemoglobin A1c - Lipid panel  2. Seasonal allergic rhinitis, unspecified trigger Stable. Continue current regimen.  3. Pure hypercholesterolemia Repeat fasting labs  today. Dietary and exercise recommendations reviewed. - Comprehensive metabolic panel - Hemoglobin A1c - Lipid panel  4. Anxiety state Continue current regimen. Work on Theme park manager. Recheck TSH today. - TSH  5. Vitamin D deficiency Repeat levels today due to history. Is taking [employer] currently containing Vit D. - Vitamin D (25 hydroxy)  6. Vitamin B12 deficiency Repeat levels today due to history. Has not been rechecked since 2018. Asymptomatic at present. - B12  7. Visit for preventive health examination Depression screen negative. Health Maintenance reviewed. Preventive schedule discussed and handout given in AVS. Will obtain fasting labs today. - CBC with Differential/Platelet - Comprehensive metabolic panel - Hemoglobin A1c - Lipid panel  8. Breast cancer screening Breast exam today without abnormal finding. She is to bring in prior records of Thermal Breast Scans for review. She will consider repeat mammography.    Leeanne Rio, PA-C

## 2019-06-06 NOTE — Telephone Encounter (Signed)
Attempted to call on call PCP again- no answer- left message regarding first call.

## 2019-06-06 NOTE — Telephone Encounter (Signed)
On call provider, Dr. Jenny Reichmann called and notified of critical vitamin D level of 107.88.

## 2019-06-06 NOTE — Telephone Encounter (Signed)
Calling for critical results:   High Vitamin D- 107.88  Call to provider on call- Dr Jenny Reichmann- got voice mail- left all information: patient name.MR,contact # , lab result and nurse P[facility] contact number if needed.

## 2019-06-09 ENCOUNTER — Other Ambulatory Visit: Payer: Self-pay | Admitting: Physician Assistant

## 2019-06-19 ENCOUNTER — Other Ambulatory Visit: Payer: Self-pay

## 2019-06-19 ENCOUNTER — Ambulatory Visit (INDEPENDENT_AMBULATORY_CARE_PROVIDER_SITE_OTHER): Payer: 59

## 2019-06-19 DIAGNOSIS — E559 Vitamin D deficiency, unspecified: Secondary | ICD-10-CM

## 2019-06-19 LAB — VITAMIN D 25 HYDROXY (VIT D DEFICIENCY, FRACTURES): VITD: 89.92 ng/mL (ref 30.00–100.00)

## 2019-07-05 ENCOUNTER — Encounter: Payer: Self-pay | Admitting: Physician Assistant

## 2019-07-08 ENCOUNTER — Other Ambulatory Visit: Payer: Self-pay | Admitting: Physician Assistant

## 2019-07-09 ENCOUNTER — Encounter: Payer: Self-pay | Admitting: Physician Assistant

## 2019-07-10 ENCOUNTER — Encounter: Payer: Self-pay | Admitting: Physician Assistant

## 2019-07-10 ENCOUNTER — Ambulatory Visit (INDEPENDENT_AMBULATORY_CARE_PROVIDER_SITE_OTHER): Payer: 59 | Admitting: Physician Assistant

## 2019-07-10 VITALS — BP 120/78 | HR 76 | Temp 97.5°F | Wt 172.0 lb

## 2019-07-10 DIAGNOSIS — B029 Zoster without complications: Secondary | ICD-10-CM | POA: Diagnosis not present

## 2019-07-10 MED ORDER — VALACYCLOVIR HCL 1 G PO TABS: 1000.0000 mg | ORAL_TABLET | Freq: Three times a day (TID) | ORAL | 0 refills | Status: AC

## 2019-07-10 NOTE — Progress Notes (Signed)
Virtual Visit via Video   I connected with patient on 07/10/19 at  1:30 PM EDT by a video enabled telemedicine application and verified that I am speaking with the correct person using two identifiers.  Location patient: Home Location provider: [facility], Office Persons participating in the virtual visit: Patient, Provider, [place] (Patina Moore)  I discussed the limitations of evaluation and management by telemedicine and the availability of in person appointments. The patient expressed understanding and agreed to proceed.  Subjective:   HPI:   Patient endorses itchy rash of her L shoulder and arm. Notes the area has also been tingling and burning without true pain. Rash started Friday and has started blistering in the past day. Notes skin is very sensitive.  Denies change to soaps, lotions or detergents. Denies recent travel or sick contact.   ROS:   See pertinent positives and negatives per HPI.  Patient Active Problem List   Diagnosis Date Noted  . Breast cancer screening 06/06/2019  . Visit for preventive health examination 01/21/2018  . Skin cysts, generalized 01/21/2018  . Vitamin D deficiency 12/23/2016  . Vitamin B12 deficiency 12/23/2016  . Fibrocystic breast disease 12/23/2016  . Seasonal allergic rhinitis 06/05/2013  . Mitral regurgitation 06/02/2011  . Hyperlipidemia 11/14/2008  . Hypertension 07/13/2008  . GILBERT'S SYNDROME 06/22/2007  . Anxiety state 06/22/2007  . PVC (premature ventricular contraction) 06/22/2007    Social History   Tobacco Use  . Smoking status: Never Smoker  . Smokeless tobacco: Never Used  Substance Use Topics  . Alcohol use: No    Current Outpatient Medications:  .  ALPRAZolam (XANAX) 0.25 MG tablet, Take 1 tablet (0.25 mg total) by mouth 2 (two) times daily as needed for anxiety., Disp: 20 tablet, Rfl: 0 .  aspirin 81 MG tablet, 81 mg. 1 by mouth 2 x weekly, Disp: , Rfl:  .  Cholecalciferol (VITAMIN D3) 10000 units  TABS, Take 1 tablet by mouth daily., Disp: , Rfl:  .  FLUoxetine (PROZAC) 20 MG tablet, Take 1 tablet (20 mg total) by mouth daily., Disp: 30 tablet, Rfl: 3 .  Magnesium Glycinate POWD, Takes daily (Patient taking differently: Take 1 tablet by mouth daily. Takes daily), Disp: , Rfl: 0 .  metoprolol succinate (TOPROL-XL) 25 MG 24 hr tablet, TAKE 1 TABLET(25 MG) BY MOUTH DAILY, Disp: 90 tablet, Rfl: 1 .  Multiple Vitamin (MULTIVITAMIN) tablet, Take 1 tablet by mouth daily.  , Disp: , Rfl:  .  progesterone (PROMETRIUM) 100 MG capsule, TK 1 C PO ON DAYS 6-14 AND 2 CS ON DAYS 15-28, Disp: , Rfl:  .  Serotonin HCl POWD, Take 1 capsule by mouth at bedtime. Seratrex, Disp: , Rfl:  .  Specialty Vitamins Products (GLYCOTROL COMPLETE) CAPS, Take 1 capsule by mouth daily. Supplement given by Herbalist, Disp: , Rfl:  .  triamcinolone ointment (KENALOG) 0.5 %, APPLY EXTERNALLY TO THE AFFECTED AREA TWICE DAILY, Disp: 30 g, Rfl: 0  Allergies  Allergen Reactions  . Cephalexin      03/2012 Dyspnea  . Penicillins     Unknown reaction as child    Objective:   BP 120/78   Pulse 76   Temp (!) 97.5 F (36.4 C) (Oral)   Wt 172 lb (78 kg)   SpO2 99%   BMI 25.40 kg/m   Patient is well-developed, well-nourished in no acute distress.  Resting comfortably at home.  Head is normocephalic, atraumatic.  No labored breathing.  Speech is clear and coherent with  logical contest.  Patient is alert and oriented at baseline.  2 x 2 cm area of blistering rash noted on erythematous base in the left upper shoulder region (superior to sacpula). There is a similar rash on the posterior upper arm. No lesions of R side.   Assessment and Plan:   1. Herpes zoster without complication Start Valtrex TID x 7 days. Supportive measures and OTC medications reviewed. Handout sent to MyChart. Follow-up in office if not improving.  - valACYclovir (VALTREX) 1000 MG tablet; Take 1 tablet (1,000 mg total) by mouth 3 (three) times  daily for 7 days.  Dispense: 21 tablet; Refill: 0 .   Leeanne Rio, PA-C 07/10/2019

## 2019-07-10 NOTE — Patient Instructions (Signed)
Take the Valtrex as directed.  Consider starting an L-Lysine supplement to see if this helps reduce recurrences.  Follow-up if symptoms are not improving or if new symptoms develop.  Hang in there!   Shingles  Shingles is an infection. It gives you a painful skin rash and blisters that have fluid in them. Shingles is caused by the same germ (virus) that causes chickenpox. Shingles only happens in people who:  Have had chickenpox.  Have been given a shot of medicine (vaccine) to protect against chickenpox. Shingles is rare in this group. The first symptoms of shingles may be itching, tingling, or pain in an area on your skin. A rash will show on your skin a few days or weeks later. The rash is likely to be on one side of your body. The rash usually has a shape like a belt or a band. Over time, the rash turns into fluid-filled blisters. The blisters will break open, change into scabs, and dry up. Medicines may:  Help with pain and itching.  Help you get better sooner.  Help to prevent long-term problems. Follow these instructions at home: Medicines  Take over-the-counter and prescription medicines only as told by your doctor.  Put on an anti-itch cream or numbing cream where you have a rash, blisters, or scabs. Do this as told by your doctor. Helping with itching and discomfort   Put cold, wet cloths (cold compresses) on the area of the rash or blisters as told by your doctor.  Cool baths can help you feel better. Try adding baking soda or dry oatmeal to the water to lessen itching. Do not bathe in hot water. Blister and rash care  Keep your rash covered with a loose bandage (dressing).  Wear loose clothing that does not rub on your rash.  Keep your rash and blisters clean. To do this, wash the area with mild soap and cool water as told by your doctor.  Check your rash every day for signs of infection. Check for: ? More redness, swelling, or pain. ? Fluid or blood. ?  Warmth. ? Pus or a bad smell.  Do not scratch your rash. Do not pick at your blisters. To help you to not scratch: ? Keep your fingernails clean and cut short. ? Wear gloves or mittens when you sleep, if scratching is a problem. General instructions  Rest as told by your doctor.  Keep all follow-up visits as told by your doctor. This is important.  Wash your hands often with soap and water. If soap and water are not available, use hand sanitizer. Doing this lowers your chance of getting a skin infection caused by germs (bacteria).  Your infection can cause chickenpox in people who have never had chickenpox or never got a shot of chickenpox vaccine. If you have blisters that did not change into scabs yet, try not to touch other people or be around other people, especially: ? Babies. ? Pregnant women. ? Children who have areas of red, itchy, or rough skin (eczema). ? Very old people who have transplants. ? People who have a long-term (chronic) sickness, like cancer or AIDS. Contact a doctor if:  Your pain does not get better with medicine.  Your pain does not get better after the rash heals.  You have any signs of infection in the rash area. These signs include: ? More redness, swelling, or pain around the rash. ? Fluid or blood coming from the rash. ? The rash area feeling warm to  the touch. ? Pus or a bad smell coming from the rash. Get help right away if:  The rash is on your face or nose.  You have pain in your face or pain by your eye.  You lose feeling on one side of your face.  You have trouble seeing.  You have ear pain, or you have ringing in your ear.  You have a loss of taste.  Your condition gets worse. Summary  Shingles gives you a painful skin rash and blisters that have fluid in them.  Shingles is an infection. It is caused by the same germ (virus) that causes chickenpox.  Keep your rash covered with a loose bandage (dressing). Wear loose clothing that  does not rub on your rash.  If you have blisters that did not change into scabs yet, try not to touch other people or be around people. This information is not intended to replace advice given to you by your health care provider. Make sure you discuss any questions you have with your health care provider. Document Released: 05/25/2008 Document Revised: 03/31/2019 Document Reviewed: 08/11/2017 Elsevier Patient Education  2020 [employer].

## 2019-07-10 NOTE — Progress Notes (Signed)
I have discussed the procedure for the virtual visit with the patient who has given consent to proceed with assessment and treatment.   Khayri Kargbo S Jaun Galluzzo, CMA     

## 2019-07-10 NOTE — Telephone Encounter (Signed)
Needs virtual visit. Please schedule. Thank you.

## 2019-07-17 ENCOUNTER — Telehealth: Payer: Self-pay | Admitting: Physician Assistant

## 2019-07-17 ENCOUNTER — Encounter: Payer: Self-pay | Admitting: Emergency Medicine

## 2019-07-17 NOTE — Telephone Encounter (Signed)
Left detailed message on VM of PCP recommendations. Will send details in a my chart message as well.

## 2019-07-17 NOTE — Telephone Encounter (Signed)
Please advise on medication change or should a virtual visit be scheduled to discuss the medication?   Copied from [place] [id number]. Topic: General - Other >> Jul 14, 2019  4:26 PM Pauline Good wrote: Reason for CRM: pt need to speak to office concerning valacyclovir making her stools loose and she has started her cycle 15 days early

## 2019-07-17 NOTE — Telephone Encounter (Signed)
Stop the Valtrex. Nothing to change to at this point -- will have to run its course.  Cycle is most likely early due to beginning the perimenopausal phase. Would watch over the next few months to see if it returns to a more normal schedule or is becoming more sporadic.  No appt needed at present time.

## 2019-08-17 ENCOUNTER — Other Ambulatory Visit: Payer: Self-pay | Admitting: Physician Assistant

## 2019-08-22 ENCOUNTER — Other Ambulatory Visit: Payer: Self-pay | Admitting: Obstetrics and Gynecology

## 2019-08-22 DIAGNOSIS — D250 Submucous leiomyoma of uterus: Secondary | ICD-10-CM

## 2019-08-29 ENCOUNTER — Ambulatory Visit
Admission: RE | Admit: 2019-08-29 | Discharge: 2019-08-29 | Disposition: A | Discharge status: Home / Self Care | Payer: 59 | Source: Ambulatory Visit | Attending: Obstetrics and Gynecology | Admitting: Obstetrics and Gynecology

## 2019-08-29 ENCOUNTER — Encounter: Payer: Self-pay | Admitting: *Deleted

## 2019-08-29 ENCOUNTER — Other Ambulatory Visit: Payer: Self-pay

## 2019-08-29 DIAGNOSIS — D250 Submucous leiomyoma of uterus: Secondary | ICD-10-CM

## 2019-08-29 HISTORY — PX: IR RADIOLOGIST EVAL & MGMT: IMG5224

## 2019-08-29 NOTE — Consult Note (Signed)
Chief Complaint: Patient was consulted remotely today (TeleHealth) for symptomatic uterine fibroids at the request of [name],Lori.    Referring Physician(s): [name],Lori  History of Present Illness: [name] is a 48 y.o. female with a 1 year history of symptomatic uterine fibroids.  On Christmas day 2019 she presented to the ED with vaginal bleeding, passage of multiple clots.  Ultrasound performed demonstrated multiple uterine fibroids.  She has had irregular periods, and continuous bleeding since July.  Her symptoms have not resolved with hormonal treatments.  No significant pelvic pain or bulk symptoms.  Recent endometrial biopsy was negative.  She is G2, P2 with no plans for future pregnancy.  No previous fibroid surgeries.  Past Medical History:  Diagnosis Date  . Anxiety   . Gilbert syndrome   . Herpes exposure    Type 1 & 2 ; Dr Garwin Brothers, [place]  . Hyperlipidemia   . Hypertension   . Premature ventricular contractions     Past Surgical History:  Procedure Laterality Date  . CESAREAN SECTION     X 2, Dr Garwin Brothers  . IR RADIOLOGIST EVAL & MGMT  08/29/2019  . TUBAL LIGATION    . WISDOM TOOTH EXTRACTION      Allergies: Cephalexin and Penicillins  Medications: Prior to Admission medications   Medication Sig Start Date End Date Taking? Authorizing Provider  ALPRAZolam (XANAX) 0.25 MG tablet Take 1 tablet (0.25 mg total) by mouth 2 (two) times daily as needed for anxiety. 04/05/19   Brunetta Jeans, PA-C  aspirin 81 MG tablet 81 mg. 1 by mouth 2 x weekly    [provider]  Cholecalciferol (VITAMIN D3) 10000 units TABS Take 1 tablet by mouth daily. 12/23/16   Binnie Rail, MD  FLUoxetine (PROZAC) 20 MG tablet Take 1 tablet (20 mg total) by mouth daily. 04/05/19   Brunetta Jeans, PA-C  Magnesium Glycinate POWD Takes daily Patient taking differently: Take 1 tablet by mouth daily. Takes daily 12/23/16   Binnie Rail, MD  metoprolol succinate  (TOPROL-XL) 25 MG 24 hr tablet TAKE 1 TABLET(25 MG) BY MOUTH DAILY 06/09/19   Brunetta Jeans, PA-C  Multiple Vitamin (MULTIVITAMIN) tablet Take 1 tablet by mouth daily.      [provider]  progesterone (PROMETRIUM) 100 MG capsule TK 1 C PO ON DAYS 6-14 AND 2 CS ON DAYS 15-28 06/21/19   [provider]  Serotonin HCl POWD Take 1 capsule by mouth at bedtime. Seratrex    [provider]  Specialty Vitamins Products (GLYCOTROL COMPLETE) CAPS Take 1 capsule by mouth daily. Supplement given by [facility]    [provider]  triamcinolone ointment (KENALOG) 0.5 % APPLY EXTERNALLY TO THE AFFECTED AREA TWICE DAILY 08/17/19   Brunetta Jeans, PA-C     Family History  Problem Relation Age of Onset  . Asthma Mother   . Stroke Father 36  . Colon cancer Father   . Diabetes Sister   . Pneumonia Sister   . Hypertension Brother   . Transient ischemic attack Brother 41  . Asthma Maternal Grandmother        died @ 79 of status asthmaticus  . Heart disease Neg Hx     Social History   Socioeconomic History  . Marital status: Married    Spouse name: Not on file  . Number of children: Not on file  . Years of education: Not on file  . Highest education level: Not on file  Occupational History  . Not on file  Social Needs  . Financial resource strain: Not on file  . Food insecurity    Worry: Not on file    Inability: Not on file  . Transportation needs    Medical: Not on file    Non-medical: Not on file  Tobacco Use  . Smoking status: Never Smoker  . Smokeless tobacco: Never Used  Substance and Sexual Activity  . Alcohol use: No  . Drug use: No  . Sexual activity: Not on file  Lifestyle  . Physical activity    Days per week: Not on file    Minutes per session: Not on file  . Stress: Not on file  Relationships  . Social Herbalist on phone: Not on file    Gets together: Not on file    Attends religious service: Not on file    Active  member of club or organization: Not on file    Attends meetings of clubs or organizations: Not on file    Relationship status: Not on file  Other Topics Concern  . Not on file  Social History Narrative  . Not on file    ECOG Status: 1 - Symptomatic but completely ambulatory  Review of Systems  Review of Systems: A 12 point ROS discussed and pertinent positives are indicated in the HPI above.  All other systems are negative.  Physical Exam No direct physical exam was performed (except for noted visual exam findings with Video Visits).     Vital Signs: There were no vitals taken for this visit.  Imaging: TRANSABDOMINAL AND TRANSVAGINAL ULTRASOUND OF PELVIS  TECHNIQUE: Both transabdominal and transvaginal ultrasound examinations of the pelvis were performed. Transabdominal technique was performed for global imaging of the pelvis including uterus, ovaries, adnexal regions, and pelvic cul-de-sac. It was necessary to proceed with endovaginal exam following the transabdominal exam to visualize the endometrium and ovaries in greater detail.  COMPARISON:  None  FINDINGS: Uterus  Measurements: 8.1 x 5.6 x 6.4 cm = volume: 155.1 mL. Multiple uterine fibroids are seen, measuring up to 2.6 cm in size. The uterus is retroflexed in nature.  Endometrium  Thickness: 0.4 cm.  No focal abnormality visualized.  Right ovary  Measurements: 2.3 x 1.5 x 1.6 cm = volume: 3.3 mL. Normal appearance/no adnexal mass.  Left ovary  Measurements: 2.4 x 1.4 x 1.3 cm = volume: 2.0 mL. Normal appearance/no adnexal mass.  Other findings  No abnormal free fluid.  IMPRESSION: 1. Multiple uterine fibroids noted. 2. Otherwise unremarkable pelvic ultrasound.   Electronically Signed   By: Garald Balding M.D.   On: 12/15/2018 02:30  Labs:  CBC: Recent Labs    12/14/18 2241 06/06/19 0924  WBC 7.7 4.8  HGB 12.2 12.6  HCT 38.7 38.3  PLT 326 294.0    COAGS: Recent  Labs    12/15/18 0112  INR 0.98    BMP: Recent Labs    06/06/19 0924  NA 137  K 4.1  CL 104  CO2 26  GLUCOSE 87  BUN 8  CALCIUM 9.0  CREATININE 0.82    LIVER FUNCTION TESTS: Recent Labs    06/06/19 0924  BILITOT 0.8  AST 14  ALT 11  ALKPHOS 74  PROT 6.6  ALBUMIN 4.3    TUMOR MARKERS: No results for input(s): AFPTM, CEA, CA199, CHROMGRNA in the last 8760 hours.  Assessment and Plan:  My impression is that this patient's menorrhagia is  likely  secondary to uterine fibroids. We spent the majority of the consultation discussing the pathophysiology of uterine leiomyomata, natural history, anticipated  involution post menopause, and treatment options. We discussed myomectomy, hysterectomy, and uterine fibroid embolization. I described the technique of UFE, anticipated benefits, possible risks and complications including but not limited to bleeding, infection, vessel damage, nontarget embolization, and incomplete symptom relief. We discussed the 90% clinical success rate historically and at our experience with UFE performed for abnormal uterine bleeding. We discussed the post procedure course and time course of symptom resolution. We discussed the need for continued gynecologic care.  She seemed to understand and did ask appropriate questions, which were answered.  Based on her evaluation thus far, I think she would be an appropriate candidate for uterine fibroid embolization because of her symptomatology and  uterine fibroids. To complete her evaluation and workup, I would require pelvic MRI with contrast to best determine the exact site of location of her uterine fibroids, specifically to exclude any pedunculated fibroids on a narrow stalk, as well as to exclude any unexpected pelvic pathology.   The patient asked appropriate questions which were answered.  After our discussion, the patient wished to discuss with her husband.  If she chooses to proceed,  we can set up the MRI    at her convenience as an outpatient. If this looks okay, she may proceed with UFE.  Thank you for this interesting consult.  I greatly enjoyed meeting [name] and look forward to participating in their care.  A copy of this report was sent to the requesting provider on this date.  Electronically Signed: Rickard Rhymes 08/29/2019, 11:20 AM   I spent a total of  30 Minutes   in remote  clinical consultation, greater than 50% of which was counseling/coordinating care for symptomatic uterine fibroids.    Visit type: Audio and video [employer]).   Alternative for in-person consultation at [facility], [address] [place], [place], [place]. This visit type was conducted due to national recommendations for restrictions regarding the COVID-19 Pandemic (e.g. social distancing).  This format is felt to be most appropriate for this patient at this time.  All issues noted in this document were discussed and addressed.

## 2019-09-11 ENCOUNTER — Encounter: Payer: Self-pay | Admitting: Physician Assistant

## 2019-09-12 NOTE — Telephone Encounter (Signed)
Spoke with patient about her concerns prior to her surgery. She is very anxious about the surgery since her sister passed away at [facility]. Investigation is ongoing She will be having her surgery at [facility] surgical center. Patient is concerned about her support system. She was advised that she can have a visitor for only 15 mins.  Patient states that [facility] visiting hours are 10a-8p.  Her surgery is scheduled for 10/03/2019.   She is currently having bleeding from her fibroids, biopsy was negative for cancer She is wanting a consultation to discuss She is questioning to use another hospital but she wants her surgeon.

## 2019-09-12 NOTE — Telephone Encounter (Signed)
Unfortunately I have no control over operating and recovery room policies.  Even without COVID visitation in the recovery room is typically limited but I am sure the family member will be able to stay with her more too once she is in an actual room after recovery.  I would recommend she discuss her concerns about procedure and location with the doctor who is managing this and will be performing operation as they may be able to offer more insight and aid in this matter.

## 2019-09-12 NOTE — Telephone Encounter (Signed)
Please reach out to patient to see what her concerns are. She can schedule a video visit with me if she prefers.

## 2019-09-13 ENCOUNTER — Other Ambulatory Visit: Payer: Self-pay | Admitting: Obstetrics and Gynecology

## 2019-09-13 NOTE — Telephone Encounter (Signed)
LMOVM, advised patient to call back for PCP recommendations

## 2019-09-21 HISTORY — PX: ABDOMINAL HYSTERECTOMY: SHX81

## 2019-09-22 ENCOUNTER — Encounter (HOSPITAL_COMMUNITY): Payer: Self-pay

## 2019-09-22 NOTE — Patient Instructions (Addendum)
DUE TO COVID-19 ONLY ONE VISITOR IS ALLOWED IN WAITING ROOM (VISITOR WILL HAVE A TEMPERATURE CHECK ON ARRIVAL AND MUST WEAR A FACE MASK THE ENTIRE TIME.)  Your COVID swab testing is scheduled for Friday, Oct. 9, 2020  at 9:35AM , You must self quarantine after your testing per handout given to you at the testing site.  ([place] up testing enter pre-surgical testing line)  Your procedure is scheduled on: Tuesday, Oct. 13, 2020  Report to [place] AT  5:30 A. M.   Call this number if you have problems the morning of surgery:  [phone].   OUR ADDRESS IS [address].  WE ARE LOCATED IN THE [place]                                   [address].                                     REMEMBER:  DO NOT EAT FOOD OR DRINK LIQUIDS AFTER MIDNIGHT .    BRUSH YOUR TEETH THE MORNING OF SURGERY.  TAKE THESE MEDICATIONS MORNING OF SURGERY WITH A SIP OF WATER: Xanax if needed  May use eye drops if needed day of surgery  DO NOT WEAR JEWERLY, MAKE UP, OR NAIL POLISH.  DO NOT WEAR LOTIONS, POWDERS, PERFUMES/COLOGNE OR DEODORANT.  DO NOT SHAVE FOR 24 HOURS PRIOR TO DAY OF SURGERY.  MEN MAY SHAVE FACE AND NECK.  CONTACTS, GLASSES, OR DENTURES MAY NOT BE WORN TO SURGERY.                                    [facility] IS NOT RESPONSIBLE  FOR ANY BELONGINGS.        Bring Prescription medication with you day of surgery in their originial containers.                                                                [facility] - Preparing for Surgery Before surgery, you can play an important role.  Because skin is not sterile, your skin needs to be as free of germs as possible.  You can reduce the number of germs on your skin by washing with CHG (chlorahexidine gluconate) soap before surgery.  CHG is an antiseptic cleaner which kills germs and bonds with the skin to continue killing germs even after washing. Please DO  NOT use if you have an allergy to CHG or antibacterial soaps.  If your skin becomes reddened/irritated stop using the CHG and inform your nurse when you arrive at Short Stay. Do not shave (including legs and underarms) for at least 48 hours prior to the first CHG shower.  You may shave your face/neck.  Please follow these instructions carefully:  1.  Shower with CHG Soap the night before surgery and the  morning of surgery.  2.  If you choose to wash your hair, wash your hair first as usual with your normal  shampoo.  3.  After you shampoo, rinse your hair and body thoroughly to remove the shampoo.                             4.  Use CHG as you would any other liquid soap.  You can apply chg directly to the skin and wash.  Gently with a scrungie or clean washcloth.  5.  Apply the CHG Soap to your body ONLY FROM THE NECK DOWN.   Do   not use on face/ open                           Wound or open sores. Avoid contact with eyes, ears mouth and   genitals (private parts).                       Wash face,  Genitals (private parts) with your normal soap.             6.  Wash thoroughly, paying special attention to the area where your    surgery  will be performed.  7.  Thoroughly rinse your body with warm water from the neck down.  8.  DO NOT shower/wash with your normal soap after using and rinsing off the CHG Soap.                9.  Pat yourself dry with a clean towel.            10.  Wear clean pajamas.            11.  Place clean sheets on your bed the night of your first shower and do not  sleep with pets. Day of Surgery : Do not apply any lotions/deodorants the morning of surgery.  Please wear clean clothes to the hospital/surgery center.  FAILURE TO FOLLOW THESE INSTRUCTIONS MAY RESULT IN THE CANCELLATION OF YOUR SURGERY  PATIENT SIGNATURE_________________________________  NURSE  SIGNATURE__________________________________  ________________________________________________________________________  WHAT IS A BLOOD TRANSFUSION? Blood Transfusion Information  A transfusion is the replacement of blood or some of its parts. Blood is made up of multiple cells which provide different functions.  Red blood cells carry oxygen and are used for blood loss replacement.  White blood cells fight against infection.  Platelets control bleeding.  Plasma helps clot blood.  Other blood products are available for specialized needs, such as hemophilia or other clotting disorders. BEFORE THE TRANSFUSION  Who gives blood for transfusions?   Healthy volunteers who are fully evaluated to make sure their blood is safe. This is blood bank blood. Transfusion therapy is the safest it has ever been in the practice of medicine. Before blood is taken from a donor, a complete history is taken to make sure that person has no history of diseases nor engages in risky social behavior (examples are intravenous drug use or sexual activity with multiple partners). The donor's travel history is screened to minimize risk of transmitting infections, such as malaria. The donated blood is tested for signs of infectious diseases, such as HIV and hepatitis. The blood is then tested to be sure it is compatible with you in order to minimize the chance of a transfusion reaction. If you or a relative donates blood, this is often done in anticipation of surgery and is not appropriate for emergency situations. It takes many days to process the donated blood. RISKS AND COMPLICATIONS  Although transfusion therapy is very safe and saves many lives, the main dangers of transfusion include:   Getting an infectious disease.  Developing a transfusion reaction. This is an allergic reaction to something in the blood you were given. Every precaution is taken to prevent this. The decision to have a blood transfusion has been  considered carefully by your caregiver before blood is given. Blood is not given unless the benefits outweigh the risks. AFTER THE TRANSFUSION  Right after receiving a blood transfusion, you will usually feel much better and more energetic. This is especially true if your red blood cells have gotten low (anemic). The transfusion raises the level of the red blood cells which carry oxygen, and this usually causes an energy increase.  The nurse administering the transfusion will monitor you carefully for complications. HOME CARE INSTRUCTIONS  No special instructions are needed after a transfusion. You may find your energy is better. Speak with your caregiver about any limitations on activity for underlying diseases you may have. SEEK MEDICAL CARE IF:   Your condition is not improving after your transfusion.  You develop redness or irritation at the intravenous (IV) site. SEEK IMMEDIATE MEDICAL CARE IF:  Any of the following symptoms occur over the next 12 hours:  Shaking chills.  You have a temperature by mouth above 102 F (38.9 C), not controlled by medicine.  Chest, back, or muscle pain.  People around you feel you are not acting correctly or are confused.  Shortness of breath or difficulty breathing.  Dizziness and fainting.  You get a rash or develop hives.  You have a decrease in urine output.  Your urine turns a dark color or changes to pink, red, or brown. Any of the following symptoms occur over the next 10 days:  You have a temperature by mouth above 102 F (38.9 C), not controlled by medicine.  Shortness of breath.  Weakness after normal activity.  The white part of the eye turns yellow (jaundice).  You have a decrease in the amount of urine or are urinating less often.  Your urine turns a dark color or changes to pink, red, or brown. Document Released: 12/04/2000 Document Revised: 02/29/2012 Document Reviewed: 07/23/2008 [facility] Patient Information 2014  [place].

## 2019-09-25 ENCOUNTER — Other Ambulatory Visit: Payer: Self-pay | Admitting: Obstetrics and Gynecology

## 2019-09-26 ENCOUNTER — Encounter (HOSPITAL_COMMUNITY): Payer: Self-pay

## 2019-09-26 ENCOUNTER — Other Ambulatory Visit: Payer: Self-pay

## 2019-09-26 ENCOUNTER — Encounter (HOSPITAL_COMMUNITY)
Admission: RE | Admit: 2019-09-26 | Discharge: 2019-09-26 | Disposition: A | Discharge status: Home / Self Care | Payer: 59 | Source: Ambulatory Visit | Attending: Obstetrics and Gynecology | Admitting: Obstetrics and Gynecology

## 2019-09-26 DIAGNOSIS — Z01812 Encounter for preprocedural laboratory examination: Secondary | ICD-10-CM | POA: Diagnosis not present

## 2019-09-26 DIAGNOSIS — D259 Leiomyoma of uterus, unspecified: Secondary | ICD-10-CM | POA: Insufficient documentation

## 2019-09-26 HISTORY — DX: Cardiac murmur, unspecified: R01.1

## 2019-09-26 HISTORY — DX: Rash and other nonspecific skin eruption: R21

## 2019-09-26 HISTORY — DX: Iron deficiency anemia, unspecified: D50.9

## 2019-09-26 HISTORY — DX: Deficiency of other specified B group vitamins: E53.8

## 2019-09-26 HISTORY — DX: Prediabetes: R73.03

## 2019-09-26 HISTORY — DX: Vitamin D deficiency, unspecified: E55.9

## 2019-09-26 LAB — BASIC METABOLIC PANEL
Anion gap: 8 (ref 5–15)
BUN: 11 mg/dL (ref 6–20)
CO2: 23 mmol/L (ref 22–32)
Calcium: 8.9 mg/dL (ref 8.9–10.3)
Chloride: 106 mmol/L (ref 98–111)
Creatinine, Ser: 0.88 mg/dL (ref 0.44–1.00)
GFR calc Af Amer: >60 mL/min (ref >60)
GFR calc non Af Amer: >60 mL/min (ref >60)
Glucose, Bld: 94 mg/dL (ref 70–99)
Potassium: 4.2 mmol/L (ref 3.5–5.1)
Sodium: 137 mmol/L (ref 135–145)

## 2019-09-26 LAB — CBC
HCT: 41.7 % (ref 36.0–46.0)
Hemoglobin: 12.9 g/dL (ref 12.0–15.0)
MCH: 26.6 pg (ref 26.0–34.0)
MCHC: 30.9 g/dL (ref 30.0–36.0)
MCV: 86 fL (ref 80.0–100.0)
Platelets: 326 10*3/uL (ref 150–400)
RBC: 4.85 MIL/uL (ref 3.87–5.11)
RDW: 14.3 % (ref 11.5–15.5)
WBC: 5.7 10*3/uL (ref 4.0–10.5)
nRBC: 0 % (ref 0.0–0.2)

## 2019-09-26 LAB — HEMOGLOBIN A1C
Hgb A1c MFr Bld: 5.7 % — ABNORMAL HIGH (ref 4.8–5.6)
Mean Plasma Glucose: 116.89 mg/dL

## 2019-09-26 LAB — ABO/RH: ABO/RH(D): O POS

## 2019-09-26 NOTE — Progress Notes (Signed)
PCP - Elyn Aquas P.A. last office visit 07/10/19 in epic Cardiologist -  N/A  Chest x-ray - N/A EKG - 11/23/2018 in epic Stress Test - N/A ECHO - greater than 2 years Cardiac Cath - N/A  Sleep Study - N/A CPAP - N/A  Fasting Blood Sugar - N/A Checks Blood Sugar _N/A____ times a day  Blood Thinner Instructions:  N/A Aspirin Instructions:  N/A Last Dose:  N/A  Anesthesia review: N/A  Patient denies shortness of breath, fever, cough and chest pain at PAT appointment   Patient verbalized understanding of instructions that were given to them at the PAT appointment. Patient was also instructed that they will need to review over the PAT instructions again at home before surgery.

## 2019-09-26 NOTE — Progress Notes (Signed)
SPOKE W/  Helene Kelp     SCREENING SYMPTOMS OF COVID 19:   COUGH--NO  RUNNY NOSE--- NO  SORE THROAT---NO  NASAL CONGESTION----NO  SNEEZING----NO  SHORTNESS OF BREATH---NO  DIFFICULTY BREATHING---NO  TEMP >100.0 -----NO  UNEXPLAINED BODY ACHES------NO  CHILLS -------- NO  HEADACHES ---------NO  LOSS OF SMELL/ TASTE --------NO    HAVE YOU OR ANY FAMILY MEMBER TRAVELLED PAST 14 DAYS OUT OF THE   COUNTY---NO STATE----NO COUNTRY----NO  HAVE YOU OR ANY FAMILY MEMBER BEEN EXPOSED TO ANYONE WITH COVID 19? NO

## 2019-09-29 ENCOUNTER — Other Ambulatory Visit (HOSPITAL_COMMUNITY)
Admission: RE | Admit: 2019-09-29 | Discharge: 2019-09-29 | Disposition: A | Discharge status: Home / Self Care | Payer: 59 | Source: Ambulatory Visit | Attending: Obstetrics and Gynecology | Admitting: Obstetrics and Gynecology

## 2019-09-29 DIAGNOSIS — Z20828 Contact with and (suspected) exposure to other viral communicable diseases: Secondary | ICD-10-CM | POA: Insufficient documentation

## 2019-09-29 DIAGNOSIS — Z01812 Encounter for preprocedural laboratory examination: Secondary | ICD-10-CM | POA: Diagnosis not present

## 2019-10-02 LAB — NOVEL CORONAVIRUS, NAA (HOSP ORDER, SEND-OUT TO REF LAB; TAT 18-24 HRS): SARS-CoV-2, NAA: NOT DETECTED

## 2019-10-02 NOTE — Anesthesia Preprocedure Evaluation (Addendum)
Anesthesia Evaluation  Patient identified by MRN, date of birth, ID band Patient awake    Reviewed: Allergy & Precautions, NPO status , Patient's Chart, lab work & pertinent test results  Airway Mallampati: I       Dental no notable dental hx. (+) Teeth Intact   Pulmonary    Pulmonary exam normal breath sounds clear to auscultation       Cardiovascular hypertension, Pt. on home beta blockers Normal cardiovascular exam Rhythm:Regular Rate:Normal     Neuro/Psych PSYCHIATRIC DISORDERS Anxiety negative neurological ROS     GI/Hepatic negative GI ROS, Neg liver ROS,   Endo/Other    Renal/GU negative Renal ROS  negative genitourinary   Musculoskeletal negative musculoskeletal ROS (+)   Abdominal Normal abdominal exam  (+)   Peds  Hematology   Anesthesia Other Findings   Reproductive/Obstetrics                            Anesthesia Physical Anesthesia Plan  ASA: II  Anesthesia Plan: General   Post-op Pain Management:    Induction: Intravenous  PONV Risk Score and Plan: 4 or greater and Ondansetron, Dexamethasone, Midazolam and Scopolamine patch - Pre-op  Airway Management Planned: Oral ETT  Additional Equipment: None  Intra-op Plan:   Post-operative Plan: Extubation in OR  Informed Consent: I have reviewed the patients History and Physical, chart, labs and discussed the procedure including the risks, benefits and alternatives for the proposed anesthesia with the patient or authorized representative who has indicated his/her understanding and acceptance.     Dental advisory given  Plan Discussed with: CRNA  Anesthesia Plan Comments:        Anesthesia Quick Evaluation

## 2019-10-03 ENCOUNTER — Ambulatory Visit (HOSPITAL_BASED_OUTPATIENT_CLINIC_OR_DEPARTMENT_OTHER)
Admission: RE | Admit: 2019-10-03 | Discharge: 2019-10-04 | Disposition: A | Discharge status: Home / Self Care | Payer: 59 | Attending: Obstetrics and Gynecology | Admitting: Obstetrics and Gynecology

## 2019-10-03 ENCOUNTER — Other Ambulatory Visit: Payer: Self-pay

## 2019-10-03 ENCOUNTER — Encounter (HOSPITAL_BASED_OUTPATIENT_CLINIC_OR_DEPARTMENT_OTHER): Payer: Self-pay | Admitting: Emergency Medicine

## 2019-10-03 ENCOUNTER — Ambulatory Visit (HOSPITAL_BASED_OUTPATIENT_CLINIC_OR_DEPARTMENT_OTHER): Payer: 59 | Admitting: Physician Assistant

## 2019-10-03 ENCOUNTER — Ambulatory Visit (HOSPITAL_BASED_OUTPATIENT_CLINIC_OR_DEPARTMENT_OTHER): Payer: 59 | Admitting: Anesthesiology

## 2019-10-03 ENCOUNTER — Encounter (HOSPITAL_BASED_OUTPATIENT_CLINIC_OR_DEPARTMENT_OTHER)
Admission: RE | Disposition: A | Discharge status: Home / Self Care | Payer: Self-pay | Source: Home / Self Care | Attending: Obstetrics and Gynecology

## 2019-10-03 DIAGNOSIS — Z881 Allergy status to other antibiotic agents status: Secondary | ICD-10-CM | POA: Insufficient documentation

## 2019-10-03 DIAGNOSIS — N838 Other noninflammatory disorders of ovary, fallopian tube and broad ligament: Secondary | ICD-10-CM | POA: Insufficient documentation

## 2019-10-03 DIAGNOSIS — I10 Essential (primary) hypertension: Secondary | ICD-10-CM | POA: Insufficient documentation

## 2019-10-03 DIAGNOSIS — Z880 Allergy status to penicillin: Secondary | ICD-10-CM | POA: Diagnosis not present

## 2019-10-03 DIAGNOSIS — D259 Leiomyoma of uterus, unspecified: Secondary | ICD-10-CM | POA: Diagnosis not present

## 2019-10-03 DIAGNOSIS — F419 Anxiety disorder, unspecified: Secondary | ICD-10-CM | POA: Insufficient documentation

## 2019-10-03 DIAGNOSIS — Z7982 Long term (current) use of aspirin: Secondary | ICD-10-CM | POA: Diagnosis not present

## 2019-10-03 DIAGNOSIS — N800 Endometriosis of uterus: Secondary | ICD-10-CM | POA: Diagnosis not present

## 2019-10-03 DIAGNOSIS — Z90710 Acquired absence of both cervix and uterus: Secondary | ICD-10-CM | POA: Diagnosis present

## 2019-10-03 DIAGNOSIS — Z79899 Other long term (current) drug therapy: Secondary | ICD-10-CM | POA: Diagnosis not present

## 2019-10-03 HISTORY — PX: ROBOTIC ASSISTED LAPAROSCOPIC HYSTERECTOMY AND SALPINGECTOMY: SHX6379

## 2019-10-03 LAB — TYPE AND SCREEN
ABO/RH(D): O POS
Antibody Screen: NEGATIVE

## 2019-10-03 SURGERY — XI ROBOTIC ASSISTED LAPAROSCOPIC HYSTERECTOMY AND SALPINGECTOMY
Anesthesia: General | Site: Abdomen | Laterality: Bilateral

## 2019-10-03 MED ORDER — OXYCODONE HCL 5 MG PO TABS
ORAL_TABLET | ORAL | Status: AC
  Filled 2019-10-03: qty 1

## 2019-10-03 MED ORDER — LACTATED RINGERS IV SOLN
INTRAVENOUS | Status: DC
  Administered 2019-10-03: 07:00:00 via INTRAVENOUS
  Filled 2019-10-03: qty 1000

## 2019-10-03 MED ORDER — DEXTROSE IN LACTATED RINGERS 5 % IV SOLN
INTRAVENOUS | Status: DC
  Administered 2019-10-03 (×2): via INTRAVENOUS
  Filled 2019-10-03 (×3): qty 1000

## 2019-10-03 MED ORDER — SIMETHICONE 80 MG PO CHEW
CHEWABLE_TABLET | ORAL | Status: AC
  Filled 2019-10-03: qty 1

## 2019-10-03 MED ORDER — HYPROMELLOSE (GONIOSCOPIC) 2.5 % OP SOLN
1.0000 [drp] | Freq: Three times a day (TID) | OPHTHALMIC | Status: DC | PRN
  Filled 2019-10-03: qty 15

## 2019-10-03 MED ORDER — ONDANSETRON HCL 4 MG/2ML IJ SOLN
4.0000 mg | Freq: Four times a day (QID) | INTRAMUSCULAR | Status: DC | PRN
  Filled 2019-10-03: qty 2

## 2019-10-03 MED ORDER — SODIUM CHLORIDE 0.9 % IR SOLN
Status: DC | PRN
  Administered 2019-10-03: 3000 mL

## 2019-10-03 MED ORDER — PANTOPRAZOLE SODIUM 40 MG PO TBEC
DELAYED_RELEASE_TABLET | ORAL | Status: AC
  Filled 2019-10-03: qty 1

## 2019-10-03 MED ORDER — ALPRAZOLAM 0.25 MG PO TABS
0.2500 mg | ORAL_TABLET | Freq: Two times a day (BID) | ORAL | Status: DC | PRN
  Filled 2019-10-03: qty 1

## 2019-10-03 MED ORDER — FENTANYL CITRATE (PF) 250 MCG/5ML IJ SOLN
INTRAMUSCULAR | Status: AC
  Filled 2019-10-03: qty 5

## 2019-10-03 MED ORDER — ROCURONIUM BROMIDE 10 MG/ML (PF) SYRINGE
PREFILLED_SYRINGE | INTRAVENOUS | Status: AC
  Filled 2019-10-03: qty 10

## 2019-10-03 MED ORDER — SUGAMMADEX SODIUM 200 MG/2ML IV SOLN
INTRAVENOUS | Status: DC | PRN
  Administered 2019-10-03: 150 mg via INTRAVENOUS

## 2019-10-03 MED ORDER — CLINDAMYCIN PHOSPHATE 900 MG/50ML IV SOLN
900.0000 mg | INTRAVENOUS | Status: AC
  Administered 2019-10-03: 900 mg via INTRAVENOUS
  Filled 2019-10-03: qty 50

## 2019-10-03 MED ORDER — MIDAZOLAM HCL 5 MG/5ML IJ SOLN
INTRAMUSCULAR | Status: DC | PRN
  Administered 2019-10-03: 2 mg via INTRAVENOUS

## 2019-10-03 MED ORDER — MIDAZOLAM HCL 2 MG/2ML IJ SOLN
INTRAMUSCULAR | Status: AC
  Filled 2019-10-03: qty 2

## 2019-10-03 MED ORDER — LIDOCAINE 2% (20 MG/ML) 5 ML SYRINGE
INTRAMUSCULAR | Status: DC | PRN
  Administered 2019-10-03: 100 mg via INTRAVENOUS

## 2019-10-03 MED ORDER — CIPROFLOXACIN IN D5W 400 MG/200ML IV SOLN
INTRAVENOUS | Status: AC
  Filled 2019-10-03: qty 200

## 2019-10-03 MED ORDER — SIMETHICONE 80 MG PO CHEW
80.0000 mg | CHEWABLE_TABLET | Freq: Four times a day (QID) | ORAL | Status: DC | PRN
  Administered 2019-10-03 – 2019-10-04 (×4): 80 mg via ORAL
  Filled 2019-10-03: qty 1

## 2019-10-03 MED ORDER — PROPOFOL 10 MG/ML IV BOLUS
INTRAVENOUS | Status: AC
  Filled 2019-10-03: qty 40

## 2019-10-03 MED ORDER — DEXAMETHASONE SODIUM PHOSPHATE 10 MG/ML IJ SOLN
INTRAMUSCULAR | Status: DC | PRN
  Administered 2019-10-03: 10 mg via INTRAVENOUS

## 2019-10-03 MED ORDER — HYDROMORPHONE HCL 1 MG/ML IJ SOLN
0.2500 mg | INTRAMUSCULAR | Status: DC | PRN
  Administered 2019-10-03 (×2): 0.25 mg via INTRAVENOUS
  Filled 2019-10-03: qty 0.5

## 2019-10-03 MED ORDER — ARTIFICIAL TEARS OPHTHALMIC OINT
TOPICAL_OINTMENT | OPHTHALMIC | Status: AC
  Filled 2019-10-03: qty 3.5

## 2019-10-03 MED ORDER — HYDROMORPHONE HCL 1 MG/ML IJ SOLN
INTRAMUSCULAR | Status: AC
  Filled 2019-10-03: qty 1

## 2019-10-03 MED ORDER — CIPROFLOXACIN IN D5W 400 MG/200ML IV SOLN
400.0000 mg | INTRAVENOUS | Status: AC
  Administered 2019-10-03: 400 mg via INTRAVENOUS
  Filled 2019-10-03: qty 200

## 2019-10-03 MED ORDER — ONDANSETRON HCL 4 MG PO TABS
4.0000 mg | ORAL_TABLET | Freq: Four times a day (QID) | ORAL | Status: DC | PRN
  Filled 2019-10-03: qty 1

## 2019-10-03 MED ORDER — CLINDAMYCIN PHOSPHATE 900 MG/50ML IV SOLN
INTRAVENOUS | Status: AC
  Filled 2019-10-03: qty 50

## 2019-10-03 MED ORDER — PROMETHAZINE HCL 25 MG/ML IJ SOLN
6.2500 mg | INTRAMUSCULAR | Status: DC | PRN
  Filled 2019-10-03: qty 1

## 2019-10-03 MED ORDER — KETOROLAC TROMETHAMINE 30 MG/ML IJ SOLN
INTRAMUSCULAR | Status: DC | PRN
  Administered 2019-10-03: 30 mg via INTRAVENOUS

## 2019-10-03 MED ORDER — MENTHOL 3 MG MT LOZG
1.0000 | LOZENGE | OROMUCOSAL | Status: DC | PRN
  Filled 2019-10-03: qty 9

## 2019-10-03 MED ORDER — KETOROLAC TROMETHAMINE 30 MG/ML IJ SOLN
30.0000 mg | Freq: Four times a day (QID) | INTRAMUSCULAR | Status: DC
  Administered 2019-10-03 – 2019-10-04 (×3): 30 mg via INTRAVENOUS
  Filled 2019-10-03: qty 1

## 2019-10-03 MED ORDER — FLUOXETINE HCL 20 MG PO TABS
20.0000 mg | ORAL_TABLET | Freq: Every day | ORAL | Status: DC
  Administered 2019-10-03: 10 mg via ORAL
  Filled 2019-10-03: qty 1

## 2019-10-03 MED ORDER — IBUPROFEN 800 MG PO TABS
800.0000 mg | ORAL_TABLET | Freq: Four times a day (QID) | ORAL | Status: DC
  Filled 2019-10-03: qty 1

## 2019-10-03 MED ORDER — BUPIVACAINE HCL (PF) 0.25 % IJ SOLN
INTRAMUSCULAR | Status: DC | PRN
  Administered 2019-10-03: 10 mL

## 2019-10-03 MED ORDER — ONDANSETRON HCL 4 MG/2ML IJ SOLN
INTRAMUSCULAR | Status: DC | PRN
  Administered 2019-10-03: 4 mg via INTRAVENOUS

## 2019-10-03 MED ORDER — KETOROLAC TROMETHAMINE 30 MG/ML IJ SOLN
INTRAMUSCULAR | Status: AC
  Filled 2019-10-03: qty 1

## 2019-10-03 MED ORDER — OXYCODONE HCL 5 MG PO TABS
5.0000 mg | ORAL_TABLET | ORAL | Status: DC | PRN
  Administered 2019-10-03 – 2019-10-04 (×4): 5 mg via ORAL
  Filled 2019-10-03: qty 2

## 2019-10-03 MED ORDER — HEMOSTATIC AGENTS (NO CHARGE) OPTIME
TOPICAL | Status: DC | PRN
  Administered 2019-10-03: 1

## 2019-10-03 MED ORDER — METOPROLOL SUCCINATE ER 25 MG PO TB24
25.0000 mg | ORAL_TABLET | Freq: Every day | ORAL | Status: DC
  Administered 2019-10-03: 25 mg via ORAL
  Filled 2019-10-03: qty 1

## 2019-10-03 MED ORDER — KETOROLAC TROMETHAMINE 30 MG/ML IJ SOLN
30.0000 mg | Freq: Once | INTRAMUSCULAR | Status: AC | PRN
  Filled 2019-10-03: qty 1

## 2019-10-03 MED ORDER — PANTOPRAZOLE SODIUM 40 MG PO TBEC
40.0000 mg | DELAYED_RELEASE_TABLET | Freq: Every day | ORAL | Status: DC
  Administered 2019-10-03: 40 mg via ORAL
  Filled 2019-10-03: qty 1

## 2019-10-03 MED ORDER — ACETAMINOPHEN 500 MG PO TABS
1000.0000 mg | ORAL_TABLET | Freq: Four times a day (QID) | ORAL | Status: DC
  Administered 2019-10-03 – 2019-10-04 (×4): 1000 mg via ORAL
  Filled 2019-10-03: qty 2

## 2019-10-03 MED ORDER — DEXAMETHASONE SODIUM PHOSPHATE 10 MG/ML IJ SOLN
INTRAMUSCULAR | Status: AC
  Filled 2019-10-03: qty 1

## 2019-10-03 MED ORDER — MEPERIDINE HCL 25 MG/ML IJ SOLN
6.2500 mg | INTRAMUSCULAR | Status: DC | PRN
  Filled 2019-10-03: qty 1

## 2019-10-03 MED ORDER — FENTANYL CITRATE (PF) 100 MCG/2ML IJ SOLN
INTRAMUSCULAR | Status: DC | PRN
  Administered 2019-10-03 (×2): 25 ug via INTRAVENOUS
  Administered 2019-10-03: 100 ug via INTRAVENOUS

## 2019-10-03 MED ORDER — LIDOCAINE 2% (20 MG/ML) 5 ML SYRINGE
INTRAMUSCULAR | Status: AC
  Filled 2019-10-03: qty 5

## 2019-10-03 MED ORDER — PROPOFOL 10 MG/ML IV BOLUS
INTRAVENOUS | Status: DC | PRN
  Administered 2019-10-03: 150 mg via INTRAVENOUS

## 2019-10-03 MED ORDER — ONDANSETRON HCL 4 MG/2ML IJ SOLN
INTRAMUSCULAR | Status: AC
  Filled 2019-10-03: qty 2

## 2019-10-03 MED ORDER — SCOPOLAMINE 1 MG/3DAYS TD PT72
1.0000 | MEDICATED_PATCH | TRANSDERMAL | Status: DC
  Administered 2019-10-03: 1.5 mg via TRANSDERMAL
  Filled 2019-10-03: qty 1

## 2019-10-03 MED ORDER — ROCURONIUM BROMIDE 50 MG/5ML IV SOSY
PREFILLED_SYRINGE | INTRAVENOUS | Status: DC | PRN
  Administered 2019-10-03: 60 mg via INTRAVENOUS
  Administered 2019-10-03: 20 mg via INTRAVENOUS

## 2019-10-03 MED ORDER — ACETAMINOPHEN 500 MG PO TABS
ORAL_TABLET | ORAL | Status: AC
  Filled 2019-10-03: qty 2

## 2019-10-03 MED ORDER — SCOPOLAMINE 1 MG/3DAYS TD PT72
MEDICATED_PATCH | TRANSDERMAL | Status: AC
  Filled 2019-10-03: qty 1

## 2019-10-03 SURGICAL SUPPLY — 59 items
ADH SKN CLS APL DERMABOND .7 (GAUZE/BANDAGES/DRESSINGS) ×1
APL SRG 38 LTWT LNG FL B (MISCELLANEOUS) ×1
APPLICATOR ARISTA FLEXITIP XL (MISCELLANEOUS) ×1 IMPLANT
BARRIER ADHS 3X4 INTERCEED (GAUZE/BANDAGES/DRESSINGS) IMPLANT
BRR ADH 4X3 ABS CNTRL BYND (GAUZE/BANDAGES/DRESSINGS)
CANISTER SUCT 3000ML PPV (MISCELLANEOUS) ×2 IMPLANT
CATH FOLEY 3WAY  5CC 16FR (CATHETERS) ×1
CATH FOLEY 3WAY 5CC 16FR (CATHETERS) ×1 IMPLANT
COVER BACK TABLE 60X90IN (DRAPES) ×2 IMPLANT
COVER TIP SHEARS 8 DVNC (MISCELLANEOUS) ×1 IMPLANT
COVER TIP SHEARS 8MM DA VINCI (MISCELLANEOUS) ×1
DECANTER SPIKE VIAL GLASS SM (MISCELLANEOUS) ×2 IMPLANT
DEFOGGER SCOPE WARMER CLEARIFY (MISCELLANEOUS) ×2 IMPLANT
DERMABOND ADVANCED (GAUZE/BANDAGES/DRESSINGS) ×1
DERMABOND ADVANCED .7 DNX12 (GAUZE/BANDAGES/DRESSINGS) ×1 IMPLANT
DRAPE ARM DVNC X/XI (DISPOSABLE) ×4 IMPLANT
DRAPE COLUMN DVNC XI (DISPOSABLE) ×1 IMPLANT
DRAPE DA VINCI XI ARM (DISPOSABLE) ×4
DRAPE DA VINCI XI COLUMN (DISPOSABLE) ×1
DURAPREP 26ML APPLICATOR (WOUND CARE) ×2 IMPLANT
ELECT REM PT RETURN 9FT ADLT (ELECTROSURGICAL) ×2
ELECTRODE REM PT RTRN 9FT ADLT (ELECTROSURGICAL) ×1 IMPLANT
GAUZE 4X4 16PLY RFD (DISPOSABLE) ×1 IMPLANT
GLOVE BIOGEL PI IND STRL 7.0 (GLOVE) ×5 IMPLANT
GLOVE BIOGEL PI INDICATOR 7.0 (GLOVE) ×5
GLOVE ECLIPSE 6.5 STRL STRAW (GLOVE) ×6 IMPLANT
HEMOSTAT ARISTA ABSORB 3G PWDR (HEMOSTASIS) ×1 IMPLANT
IRRIG SUCT STRYKERFLOW 2 WTIP (MISCELLANEOUS) ×2
IRRIGATION SUCT STRKRFLW 2 WTP (MISCELLANEOUS) ×1 IMPLANT
LEGGING LITHOTOMY PAIR STRL (DRAPES) ×2 IMPLANT
NEEDLE INSUFFLATION 120MM (ENDOMECHANICALS) ×2 IMPLANT
OBTURATOR OPTICAL STANDARD 8MM (TROCAR) ×1
OBTURATOR OPTICAL STND 8 DVNC (TROCAR) ×1
OBTURATOR OPTICALSTD 8 DVNC (TROCAR) ×1 IMPLANT
OCCLUDER COLPOPNEUMO (BALLOONS) ×2 IMPLANT
PACK ROBOT WH (CUSTOM PROCEDURE TRAY) ×2 IMPLANT
PACK ROBOTIC GOWN (GOWN DISPOSABLE) ×2 IMPLANT
PACK TRENDGUARD 450 HYBRID PRO (MISCELLANEOUS) ×1 IMPLANT
PAD PREP 24X48 CUFFED NSTRL (MISCELLANEOUS) ×2 IMPLANT
PROTECTOR NERVE ULNAR (MISCELLANEOUS) ×3 IMPLANT
SEAL CANN UNIV 5-8 DVNC XI (MISCELLANEOUS) ×4 IMPLANT
SEAL XI 5MM-8MM UNIVERSAL (MISCELLANEOUS) ×4
SEALER VESSEL DA VINCI XI (MISCELLANEOUS) ×1
SEALER VESSEL EXT DVNC XI (MISCELLANEOUS) ×1 IMPLANT
SET CYSTO W/LG BORE CLAMP LF (SET/KITS/TRAYS/PACK) IMPLANT
SET TRI-LUMEN FLTR TB AIRSEAL (TUBING) ×2 IMPLANT
SUT VIC AB 0 CT1 36 (SUTURE) ×4 IMPLANT
SUT VICRYL 4-0 PS2 18IN ABS (SUTURE) ×4 IMPLANT
SUT VLOC 180 0 9IN  GS21 (SUTURE) ×2
SUT VLOC 180 0 9IN GS21 (SUTURE) ×1 IMPLANT
TIP RUMI ORANGE 6.7MMX12CM (TIP) IMPLANT
TIP UTERINE 5.1X6CM LAV DISP (MISCELLANEOUS) IMPLANT
TIP UTERINE 6.7X10CM GRN DISP (MISCELLANEOUS) ×1 IMPLANT
TIP UTERINE 6.7X6CM WHT DISP (MISCELLANEOUS) IMPLANT
TIP UTERINE 6.7X8CM BLUE DISP (MISCELLANEOUS) IMPLANT
TOWEL OR 17X26 10 PK STRL BLUE (TOWEL DISPOSABLE) ×4 IMPLANT
TRENDGUARD 450 HYBRID PRO PACK (MISCELLANEOUS) ×2
TROCAR PORT AIRSEAL 8X120 (TROCAR) ×2 IMPLANT
WATER STERILE IRR 1000ML POUR (IV SOLUTION) ×2 IMPLANT

## 2019-10-03 NOTE — Anesthesia Procedure Notes (Signed)
Procedure Name: Intubation Date/Time: 10/03/2019 7:45 AM Performed by: Bonney Aid, CRNA Pre-anesthesia Checklist: Patient identified, Emergency Drugs available, Suction available and Patient being monitored Patient Re-evaluated:Patient Re-evaluated prior to induction Oxygen Delivery Method: Circle system utilized Preoxygenation: Pre-oxygenation with 100% oxygen Induction Type: IV induction Ventilation: Mask ventilation without difficulty Laryngoscope Size: Mac and 3 Grade View: Grade II Tube type: Oral Tube size: 7.0 mm Number of attempts: 1 Airway Equipment and Method: Stylet and Oral airway Placement Confirmation: ETT inserted through vocal cords under direct vision,  positive ETCO2 and breath sounds checked- equal and bilateral Secured at: 21 cm Tube secured with: Tape Dental Injury: Teeth and Oropharynx as per pre-operative assessment

## 2019-10-03 NOTE — Progress Notes (Signed)
Dr. Jillyn Hidden notified per patient request for scopolamine patch. Orders given, patch applied. Will continue to monitor.

## 2019-10-03 NOTE — Op Note (Signed)
[name], DRAINE MEDICAL RECORD [id number] ACCOUNT [id number] DATE OF BIRTH:[birth year] FACILITY: [facility] LOCATION: [facility] PHYSICIAN:Kaysa Roulhac A. Maalle Starrett, MD  OPERATIVE REPORT  DATE OF PROCEDURE:  10/03/2019  PREOPERATIVE DIAGNOSIS:  Symptomatic uterine fibroids, previous cesarean sections.  PROCEDURE:  DaVinci robotic total hysterectomy, bilateral salpingectomy.  POSTOPERATIVE DIAGNOSIS:  Symptomatic uterine fibroids, previous cesarean sections.  ANESTHESIA:  General.  SURGEON:  Servando Salina, MD  ASSISTANT:  Sullivan Lone, RNFA.  DESCRIPTION OF PROCEDURE:  Under adequate general anesthesia, the patient was placed in the dorsal lithotomy position for robotic surgery.  She was sterilely prepped and draped in the usual fashion.  A 3-way Foley catheter was sterilely placed.   Examination under anesthesia had revealed a retroverted 11-12 week size uterus.  No adnexal masses could be appreciated.  The patient had prior cesarean section and tubal ligation.  A weighted speculum was placed in the vagina.  Sims retractor was placed  anteriorly.  A Vicryl figure-of-eight suture was placed on the anterior and posterior lip of the cervix.  The uterus sounded to 10 cm.  A 10 mm uterine manipulator with a small RUMI cup was inserted into the uterus without incident.  The retractors were  removed.  Attention was then turned to the abdomen.  Marcaine 0.25% was injected supraumbilically.  A supraumbilical incision vertical was made.  Veress needle was introduced and tested with normal saline.  Opening pressure of 8 was noted.  Two liters  of CO2 was insufflated.  Veress needle was then removed and an 8 mm robotic trocar was introduced into the abdomen without incident.  A lighted video robotic camera was inserted.  Panoramic inspection showed no injury to the underlying organs and entry  into the abdomen.  Upper abdomen with normal liver edge and gallbladder.  Pelvis which showed an enlarged  fibroid uterus and both tubal ostia being be seen without any abnormalities.  The left ovary may have a small cyst.  Surgical separation of both  tubes was noted.  Additional robotic ports were then placed, two on the right, 8 cm apart, one on the distal left and in between that an intervening 8 mm AirSeal was then placed all done under direct visualization.  The robot was then docked to the  patient bedside and in arm #1 a vessel sealer was placed.  In arm #3 a bipolar forceps was placed and arm #4 was the monopolar scissors.  I then went to the surgical console.  At the surgical console, the pelvis was inspected.  Both ureters were noted  peristalsing well.  The surgical separation of both tubes were identified.  Small adhesion of the bladder in the lower uterine segment anteriorly was noted.  The procedure was begun on the left side where the left fallopian tube was grasped.  The distal  end that was remaining was serially clamped, cauterized, cut and removed.  The retroperitoneal space on the left was opened.  A window was placed in the broad ligament medially.  The left uteroovarian ligament was serially clamped, cauterized, and cut.   The round ligaments were then clamped, cauterized, and cut.  An anterior leaf of the broad ligament was opened and carried around to the vesicouterine peritoneum, which was opened with cautery and the bladder was displaced inferiorly overlying the RUMI  cup.  Uterine vessels were then skeletonized.  They were serially clamped, cauterized and then cut.  The same procedure was performed on the contralateral side with removal of the right tube followed by  the round ligament and the remaining portion of the  vesicouterine peritoneum being opened and the bladder displaced further inferiorly.  The uterine vessels were skeletonized, serially clamped, cauterized and then cut.  The vaginal insufflator was then utilized.  The bladder was further displaced  inferiorly and a  circumferential incision at the upper part of the RUMI cup was done and carried around circumferentially with subsequently separation of the cervix from its vaginal attachment.  The uterus was removed through the vagina.  The instruments  were then replaced respectively.  The vessel sealer was replaced with a long tip forceps and the monopolar scissors were replaced with a mega suture needle driver.  The V-Loc suture was then placed.  The vaginal cuff was inspected.  Small bleeders  cauterized and the vaginal cuff was closed in 2 with 0 Monocryl from the right to left, left to right.  The vaginal cuff was inspected intraoperatively with good approximation noted overlying additional incision sutures was placed using the V-Loc.  The abdominal pressure was reduced.  The abdomen was inspected for bleeders.  None was noted.  Good hemostasis was noted.  Abdomen was irrigated and suctioned of debris.  The ovarian pedicles were inspected.  Appendix was noted to be normal and Arista potato starch was then  placed overlying the vaginal cuff and the robotic instruments were then removed.  The abdomen was deflated.  The robot was undocked.  I went back to the patient's bedside sterilely with all the ports having been removed and the incisions closed with 4-0 Vicryl subcuticular closure and Dermabond was placed.  I reinspected the vagina with speculum.  No laceration noted.  Vaginal cuff digitally was well approximated.  SPECIMEN:  Uterus with cervix and fallopian tubes sent to pathology weighing 200.3 grams.  INTRAOPERATIVE FLUIDS:  800 mL.  URINE OUTPUT:  400 mL clear yellow urine.  ESTIMATED BLOOD LOSS:  10 mL.  COUNTS:  Sponge and instrument counts x2 was correct.  COMPLICATIONS:  None.  DISPOSITION:  The patient tolerated the procedure well and was transferred to recovery room in stable condition.  TN/NUANCE  D:10/03/2019 T:10/03/2019 JOB:[id number]

## 2019-10-03 NOTE — Transfer of Care (Signed)
Immediate Anesthesia Transfer of Care Note  Patient: [name]  Procedure(s) Performed: XI ROBOTIC ASSISTED LAPAROSCOPIC HYSTERECTOMY AND SALPINGECTOMY (Bilateral Abdomen)  Patient Location: PACU  Anesthesia Type:General  Level of Consciousness: drowsy  Airway & Oxygen Therapy: Patient Spontanous Breathing and Patient connected to nasal cannula oxygen  Post-op Assessment: Report given to RN  Post vital signs: Reviewed and stable  Last Vitals:  Vitals Value Taken Time  BP 143/82 10/03/19 1017  Temp    Pulse 79 10/03/19 1018  Resp 15 10/03/19 1018  SpO2 99 % 10/03/19 1018  Vitals shown include unvalidated device data.  Last Pain:  Vitals:   10/03/19 0627  TempSrc: Oral      Patients Stated Pain Goal: 3 ([id number])  Complications: No apparent anesthesia complications

## 2019-10-03 NOTE — Anesthesia Postprocedure Evaluation (Signed)
Anesthesia Post Note  Patient: [name]  Procedure(s) Performed: XI ROBOTIC ASSISTED LAPAROSCOPIC HYSTERECTOMY AND SALPINGECTOMY (Bilateral Abdomen)     Patient location during evaluation: PACU Anesthesia Type: General Level of consciousness: awake and sedated Pain management: pain level controlled Vital Signs Assessment: post-procedure vital signs reviewed and stable Respiratory status: spontaneous breathing Cardiovascular status: stable Postop Assessment: no apparent nausea or vomiting Anesthetic complications: no    Last Vitals:  Vitals:   10/03/19 1019 10/03/19 1030  BP: (!) 143/82 127/69  Pulse: 80 82  Resp: 14 (!) 8  Temp: 36.6 C   SpO2: 99% 99%    Last Pain:  Vitals:   10/03/19 1019  TempSrc:   PainSc: 0-No pain   Pain Goal: Patients Stated Pain Goal: 3 (10/03/19 0627)                 Huston Foley

## 2019-10-04 DIAGNOSIS — D259 Leiomyoma of uterus, unspecified: Secondary | ICD-10-CM | POA: Diagnosis not present

## 2019-10-04 LAB — CBC
HCT: 35.1 % — ABNORMAL LOW (ref 36.0–46.0)
Hemoglobin: 10.8 g/dL — ABNORMAL LOW (ref 12.0–15.0)
MCH: 26.5 pg (ref 26.0–34.0)
MCHC: 30.8 g/dL (ref 30.0–36.0)
MCV: 86.2 fL (ref 80.0–100.0)
Platelets: 289 10*3/uL (ref 150–400)
RBC: 4.07 MIL/uL (ref 3.87–5.11)
RDW: 14 % (ref 11.5–15.5)
WBC: 11.3 10*3/uL — ABNORMAL HIGH (ref 4.0–10.5)
nRBC: 0 % (ref 0.0–0.2)

## 2019-10-04 LAB — BASIC METABOLIC PANEL
Anion gap: 10 (ref 5–15)
BUN: 10 mg/dL (ref 6–20)
CO2: 20 mmol/L — ABNORMAL LOW (ref 22–32)
Calcium: 8.4 mg/dL — ABNORMAL LOW (ref 8.9–10.3)
Chloride: 109 mmol/L (ref 98–111)
Creatinine, Ser: 0.78 mg/dL (ref 0.44–1.00)
GFR calc Af Amer: >60 mL/min (ref >60)
GFR calc non Af Amer: >60 mL/min (ref >60)
Glucose, Bld: 103 mg/dL — ABNORMAL HIGH (ref 70–99)
Potassium: 4 mmol/L (ref 3.5–5.1)
Sodium: 139 mmol/L (ref 135–145)

## 2019-10-04 LAB — SURGICAL PATHOLOGY

## 2019-10-04 MED ORDER — ACETAMINOPHEN 500 MG PO TABS
ORAL_TABLET | ORAL | Status: AC
  Filled 2019-10-04: qty 2

## 2019-10-04 MED ORDER — OXYCODONE HCL 5 MG PO TABS: 5.0000 mg | ORAL_TABLET | ORAL | 0 refills | Status: AC | PRN

## 2019-10-04 MED ORDER — KETOROLAC TROMETHAMINE 30 MG/ML IJ SOLN
INTRAMUSCULAR | Status: AC
  Filled 2019-10-04: qty 1

## 2019-10-04 MED ORDER — IBUPROFEN 800 MG PO TABS: 800.0000 mg | ORAL_TABLET | Freq: Four times a day (QID) | ORAL | 5 refills | Status: DC | PRN

## 2019-10-04 MED ORDER — SIMETHICONE 80 MG PO CHEW
CHEWABLE_TABLET | ORAL | Status: AC
  Filled 2019-10-04: qty 1

## 2019-10-04 MED ORDER — OXYCODONE HCL 5 MG PO TABS: 5.0000 mg | ORAL_TABLET | ORAL | 0 refills | Status: DC | PRN

## 2019-10-04 MED ORDER — OXYCODONE HCL 5 MG PO TABS
ORAL_TABLET | ORAL | Status: AC
  Filled 2019-10-04: qty 1

## 2019-10-04 NOTE — Progress Notes (Signed)
Subjective: Patient reports tolerating PO and no problems voiding.   Reviewed intraop findings. Pictures given to pt Objective: I have reviewed patient's vital signs.  vital signs, intake and output and labs. Vitals:   10/04/19 0007 10/04/19 0415  BP: 127/67 120/67  Pulse: 73 60  Resp: 16   Temp: 98.2 F (36.8 C) 98.5 F (36.9 C)  SpO2: 98% 100%   I/O last 3 completed shifts: In: 2515 [P.O.:440; I.V.:1825; IV Piggyback:250] Out: 1960 [id number]; Blood:10] No intake/output data recorded.  Lab Results  Component Value Date   WBC 11.3 (H) 10/04/2019   HGB 10.8 (L) 10/04/2019   HCT 35.1 (L) 10/04/2019   MCV 86.2 10/04/2019   PLT 289 10/04/2019   Lab Results  Component Value Date   CREATININE 0.78 10/04/2019    EXAM General: alert, cooperative and no distress Resp: clear to auscultation bilaterally Cardio: regular rate and rhythm, S1, S2 normal, no murmur, click, rub or gallop GI: incision: clean and dry and soft non distended active BS Extremities: no edema, redness or tenderness in the calves or thighs Vaginal Bleeding: minimal Back no CVAT  Assessment: s/p Procedure(s): XI ROBOTIC ASSISTED LAPAROSCOPIC HYSTERECTOMY AND SALPINGECTOMY: stable, progressing well and tolerating diet  Plan: Encourage ambulation Discontinue IV fluids Discharge home d/c instructions reviewed  F/u 2wk  LOS: 0 days    Marvene Staff, MD 10/04/2019 7:50 AM    10/04/2019, 7:50 AM

## 2019-10-04 NOTE — Discharge Instructions (Signed)
Total Laparoscopic Hysterectomy, Care After °This sheet gives you information about how to care for yourself after your procedure. Your health care provider may also give you more specific instructions. If you have problems or questions, contact your health care provider. °What can I expect after the procedure? °After the procedure, it is common to have: °· Pain and bruising around your incisions. °· A sore throat, if a breathing tube was used during surgery. °· Fatigue. °· Poor appetite. °· Less interest in sex. °If your ovaries were also removed, it is also common to have symptoms of menopause such as hot flashes, night sweats, and lack of sleep (insomnia). °Follow these instructions at home: °Bathing °· Do not take baths, swim, or use a hot tub until your health care provider approves. You may need to only take showers for 2-3 weeks. °· Keep your bandage (dressing) dry until your health care provider says it can be removed. °Incision care ° °· Follow instructions from your health care provider about how to take care of your incisions. Make sure you: °? Wash your hands with soap and water before you change your dressing. If soap and water are not available, use hand sanitizer. °? Change your dressing as told by your health care provider. °? Leave stitches (sutures), skin glue, or adhesive strips in place. These skin closures may need to stay in place for 2 weeks or longer. If adhesive strip edges start to loosen and curl up, you may trim the loose edges. Do not remove adhesive strips completely unless your health care provider tells you to do that. °· Check your incision area every day for signs of infection. Check for: °? Redness, swelling, or pain. °? Fluid or blood. °? Warmth. °? Pus or a bad smell. °Activity °· Get plenty of rest and sleep. °· Do not lift anything that is heavier than 10 lbs (4.5 kg) for one month after surgery, or as long as told by your health care provider. °· Do not drive or use heavy  machinery while taking prescription pain medicine. °· Do not drive for 24 hours if you were given a medicine to help you relax (sedative). °· Return to your normal activities as told by your health care provider. Ask your health care provider what activities are safe for you. °Lifestyle ° °· Do not use any products that contain nicotine or tobacco, such as cigarettes and e-cigarettes. These can delay healing. If you need help quitting, ask your health care provider. °· Do not drink alcohol until your health care provider approves. °General instructions °· Do not douche, use tampons, or have sex for at least 6 weeks, or as told by your health care provider. °· Take over-the-counter and prescription medicines only as told by your health care provider. °· To monitor yourself for a fever, take your temperature at least once a day during recovery. °· If you struggle with physical or emotional changes after your procedure, speak with your health care provider or a therapist. °· To prevent or treat constipation while you are taking prescription pain medicine, your health care provider may recommend that you: °? Drink enough fluid to keep your urine clear or pale yellow. °? Take over-the-counter or prescription medicines. °? Eat foods that are high in fiber, such as fresh fruits and vegetables, whole grains, and beans. °? Limit foods that are high in fat and processed sugars, such as fried and sweet foods. °· Keep all follow-up visits as told by your health care provider.   This is important. Contact a health care provider if:  You have chills or a fever.  You have redness, swelling, or pain around an incision.  You have fluid or blood coming from an incision.  Your incision feels warm to the touch.  You have pus or a bad smell coming from an incision.  An incision breaks open.  You feel dizzy or light-headed.  You have pain or bleeding when you urinate.  You have diarrhea, nausea, or vomiting that does not  go away.  You have abnormal vaginal discharge.  You have a rash.  You have pain that does not get better with medicine. Get help right away if:  You have a fever and your symptoms suddenly get worse.  You have severe abdominal pain.  You have chest pain.  You have shortness of breath.  You faint.  You have pain, swelling, or redness on your leg.  You have heavy vaginal bleeding with blood clots. Summary  After the procedure it is common to have abdominal pain. Your provider will give you medication for this.  Do not take baths, swim, or use a hot tub until your health care provider approves.  Do not lift anything that is heavier than 10 lbs (4.5 kg) for one month after surgery, or as long as told by your health care provider.  Notify your provider if you have any signs or symptoms of infection after the procedure. This information is not intended to replace advice given to you by your health care provider. Make sure you discuss any questions you have with your health care provider. Document Released: 09/27/2013 Document Revised: 11/19/2017 Document Reviewed: 02/17/2017 Elsevier Patient Education  2020 [employer].  Call if temperature greater than equal to 100.4, nothing per vagina for 4-6 weeks or severe nausea vomiting, increased incisional pain , drainage or redness in the incision site, no straining with bowel movements, showers no bath

## 2019-10-04 NOTE — Brief Op Note (Signed)
10/03/2019  3:33 AM  PATIENT:  [name]  48 y.o. female  PRE-OPERATIVE DIAGNOSIS:  Symptomatic Uterine Fibroid  POST-OPERATIVE DIAGNOSIS:  Symptomatic Uterine Fibroid  PROCEDURE:  DaVinci robotic total hysterectomy, bilateral salpingectomy  SURGEON:  Surgeon(s) and Role:    * Servando Salina, MD - Primary  PHYSICIAN ASSISTANT:   ASSISTANTS: Sullivan Lone RNFA   ANESTHESIA:   general FINDINGS; fibroid uterus, nl ovaries, surgical separated tubes, nl appendix, nl liver edge, no evidence of endometriosis, ureters bilaterally peristalsing EBL:  10 mL   BLOOD ADMINISTERED:none  DRAINS: none   LOCAL MEDICATIONS USED:  MARCAINE     SPECIMEN:  Source of Specimen:  uterus with cervix, tubes  DISPOSITION OF SPECIMEN:  PATHOLOGY  COUNTS:  YES  TOURNIQUET:  * No tourniquets in log *  DICTATION: .Other Dictation: Dictation Number [id number]  PLAN OF CARE: Admit for overnight observation  PATIENT DISPOSITION:  PACU - hemodynamically stable.   Delay start of Pharmacological VTE agent (>24hrs) due to surgical blood loss or risk of bleeding: no

## 2019-10-04 NOTE — Discharge Summary (Signed)
Physician Discharge Summary  Patient ID: [name] MRN: [id number] DOB/AGE: 1971-07-24 48 y.o.  Admit date: 10/03/2019 Discharge date: 10/04/2019  Admission Diagnoses: symptomatic uterine fibroids, previous cesarean section  Discharge Diagnoses:  same Active Problems:   Uterine fibroid   Status post total hysterectomy   Discharged Condition: stable  Hospital Course: pt underwent daVinci robotic total hysterectomy, bilateral salpingectomy. Uncomplicated postoperative course.   Consults: None  Significant Diagnostic Studies: labs:  CBC Latest Ref Rng & Units 10/04/2019 09/26/2019 06/06/2019  WBC 4.0 - 10.5 K/uL 11.3(H) 5.7 4.8  Hemoglobin 12.0 - 15.0 g/dL 10.8(L) 12.9 12.6  Hematocrit 36.0 - 46.0 % 35.1(L) 41.7 38.3  Platelets 150 - 400 K/uL 289 326 294.0   BMP Latest Ref Rng & Units 10/04/2019 09/26/2019 06/06/2019  Glucose 70 - 99 mg/dL 103(H) 94 87  BUN 6 - 20 mg/dL 10 11 8   Creatinine 0.44 - 1.00 mg/dL 0.78 0.88 0.82  Sodium 135 - 145 mmol/L 139 137 137  Potassium 3.5 - 5.1 mmol/L 4.0 4.2 4.1  Chloride 98 - 111 mmol/L 109 106 104  CO2 22 - 32 mmol/L 20(L) 23 26  Calcium 8.9 - 10.3 mg/dL 8.4(L) 8.9 9.0    Treatments: surgery: Davinci robotic total hysterectomy, bilateral salpingectomy  Discharge Exam: Blood pressure 124/70, pulse 68, temperature 98.3 F (36.8 C), resp. rate 16, height 5\' 7"  (1.702 m), weight 73.9 kg, last menstrual period 07/07/2019, SpO2 100 %. General appearance: alert, cooperative and no distress Back: no tenderness to percussion or palpation Resp: clear to auscultation bilaterally Cardio: regular rate and rhythm, S1, S2 normal, no murmur, click, rub or gallop GI: soft, non-tender; bowel sounds normal; no masses,  no organomegaly Pelvic: deferred Extremities: no edema, redness or tenderness in the calves or thighs Incision/Wound: intact/clean/dry  Disposition: Discharge disposition: 01-Home or Self Care       Discharge Instructions    Call MD for:  persistant nausea and vomiting   Complete by: As directed    Call MD for:  severe uncontrolled pain   Complete by: As directed    Call MD for:  temperature >100.4   Complete by: As directed    Diet - low sodium heart healthy   Complete by: As directed    Increase activity slowly   Complete by: As directed      Allergies as of 10/04/2019      Reactions   Cephalexin     03/2012 Dyspnea   Other    Grapefruit due to Fluoxetine   Penicillins    Unknown reaction as child      Medication List    TAKE these medications   ALPRAZolam 0.25 MG tablet Commonly known as: XANAX Take 1 tablet (0.25 mg total) by mouth 2 (two) times daily as needed for anxiety.   clotrimazole-betamethasone cream Commonly known as: LOTRISONE Apply 1 application topically daily as needed (rash).   FLUoxetine 20 MG tablet Commonly known as: PROZAC Take 1 tablet (20 mg total) by mouth daily. What changed: how much to take   hydroxypropyl methylcellulose / hypromellose 2.5 % ophthalmic solution Commonly known as: ISOPTO TEARS / GONIOVISC Place 1 drop into both eyes 3 (three) times daily as needed for dry eyes.   ibuprofen 800 MG tablet Commonly known as: ADVIL Take 1 tablet (800 mg total) by mouth every 6 (six) hours as needed for mild pain or moderate pain.   Magnesium Glycinate Powd Takes daily What changed:   how much to take  how  to take this  when to take this  additional instructions   metoprolol succinate 25 MG 24 hr tablet Commonly known as: TOPROL-XL TAKE 1 TABLET(25 MG) BY MOUTH DAILY What changed: See the new instructions.   multivitamin tablet Take 1 tablet by mouth daily.   OVER THE COUNTER MEDICATION Place 1 drop under the tongue daily.   oxyCODONE 5 MG immediate release tablet Commonly known as: Oxy IR/ROXICODONE Take 1-2 tablets (5-10 mg total) by mouth every 4 (four) hours as needed for up to 7 days for moderate pain or severe pain.   vitamin C 1000 MG  tablet Take 1,000 mg by mouth daily.   Vitamin D3 250 MCG (10000 UT) Tabs Take 1 tablet by mouth daily.   Zinc 30 MG Tabs Take 30 mg by mouth every other day.      Follow-up Information    Servando Salina, MD Follow up in 2 week(s).   Specialty: Obstetrics and Gynecology Contact information: [address] [place] [address] [phone]           Signed: Alanda Slim A Duron Meister 10/04/2019, 8:48 AM

## 2019-10-05 ENCOUNTER — Encounter (HOSPITAL_BASED_OUTPATIENT_CLINIC_OR_DEPARTMENT_OTHER): Payer: Self-pay | Admitting: Obstetrics and Gynecology

## 2019-11-07 ENCOUNTER — Other Ambulatory Visit: Payer: Self-pay | Admitting: Physician Assistant

## 2019-11-07 DIAGNOSIS — F411 Generalized anxiety disorder: Secondary | ICD-10-CM

## 2019-11-20 ENCOUNTER — Other Ambulatory Visit: Payer: Self-pay | Admitting: Physician Assistant

## 2019-11-21 IMAGING — US US PELVIS COMPLETE
1 series · 14 of 25 positions shown · non-contrast
Comparison: None

CLINICAL DATA: Acute onset of vaginal bleeding.

EXAM:
TRANSABDOMINAL AND TRANSVAGINAL ULTRASOUND OF PELVIS
TECHNIQUE: Both transabdominal and transvaginal ultrasound examinations of the
pelvis were performed. Transabdominal technique was performed for
global imaging of the pelvis including uterus, ovaries, adnexal
regions, and pelvic cul-de-sac. It was necessary to proceed with
endovaginal exam following the transabdominal exam to visualize the
endometrium and ovaries in greater detail.

[Series 1: us pelvis complete · 14 of 52 slices shown]
[im 1/52]
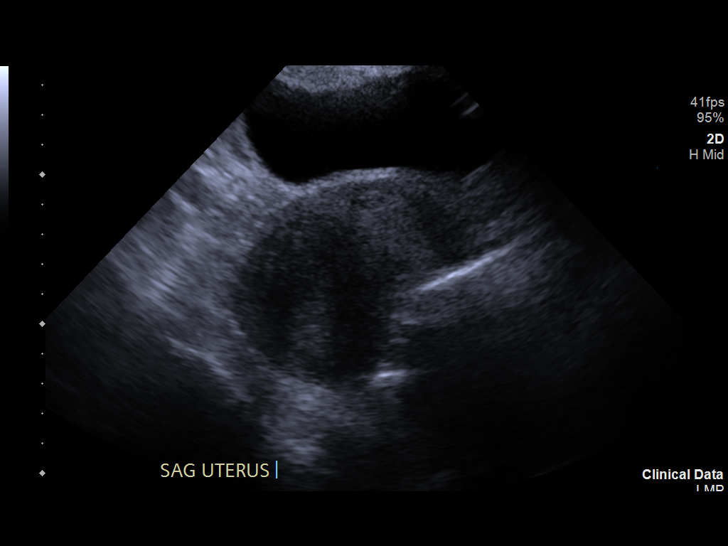
[im 5/52]
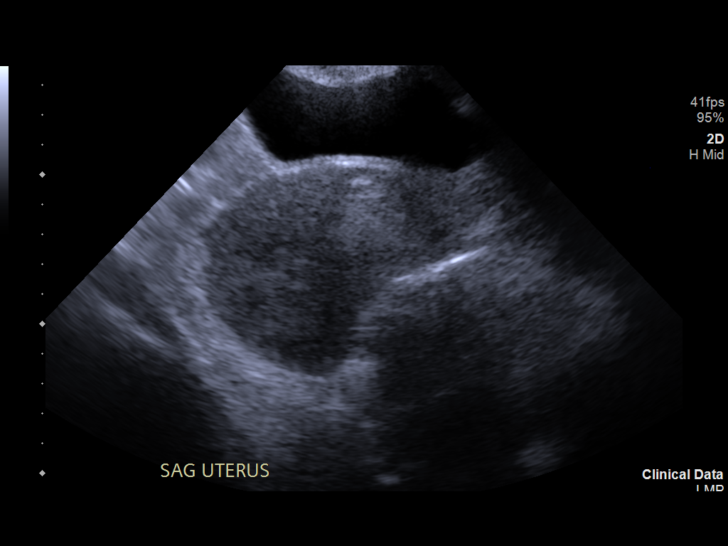
[im 9/52]
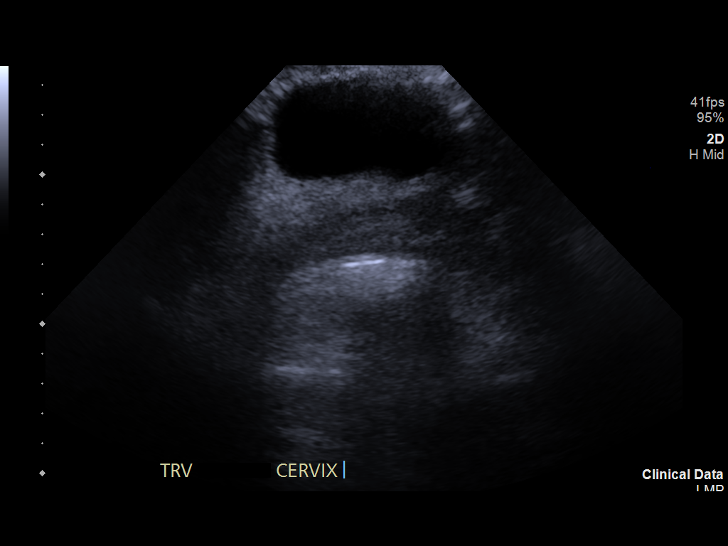
[im 13/52]
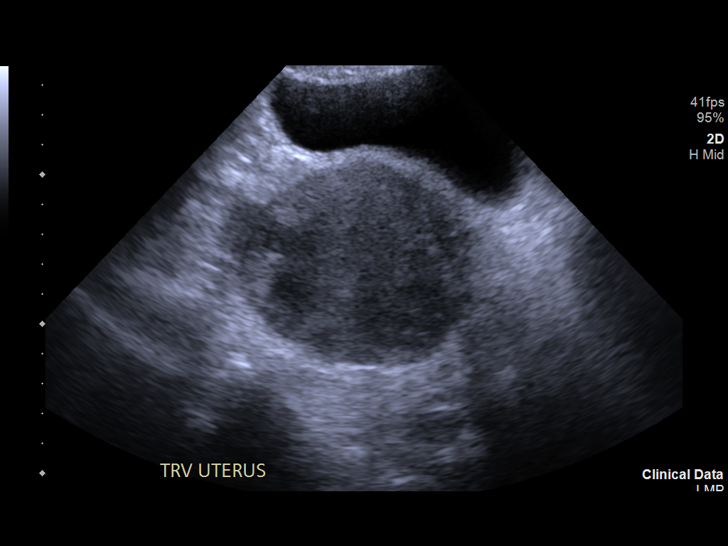
[im 18/52]
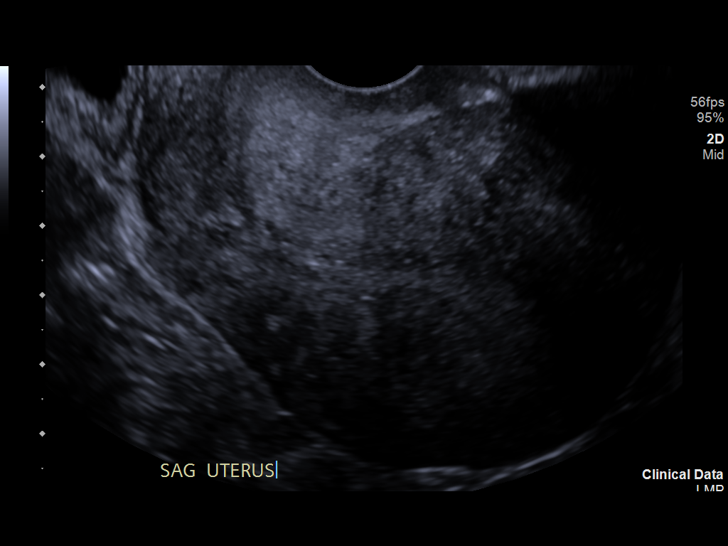
[im 20/52]
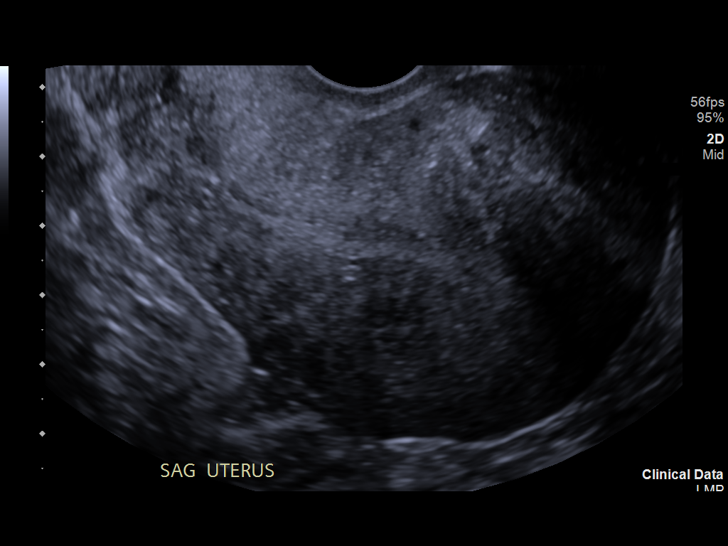
[im 24/52]
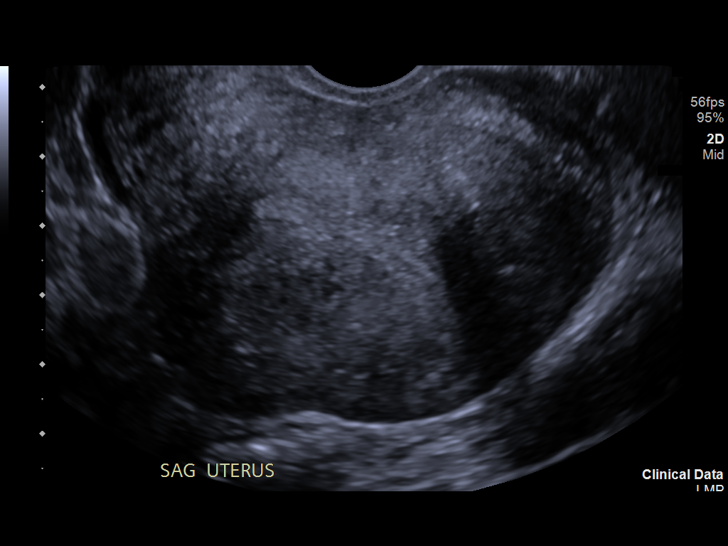
[im 28/52]
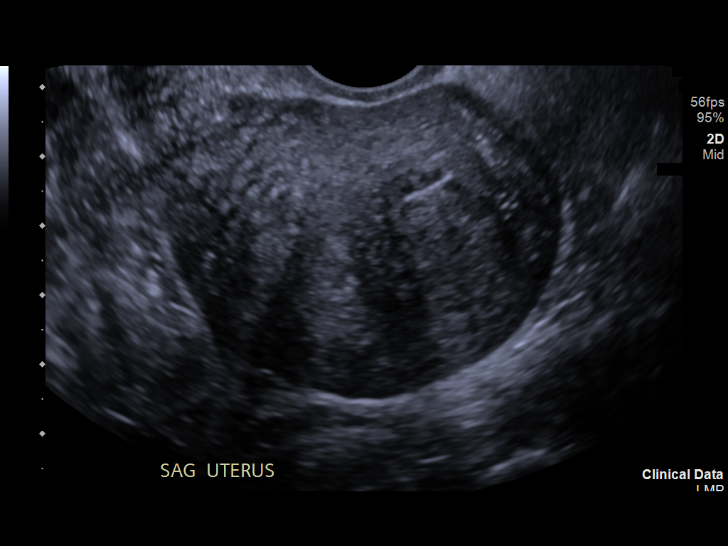
[im 32/52]
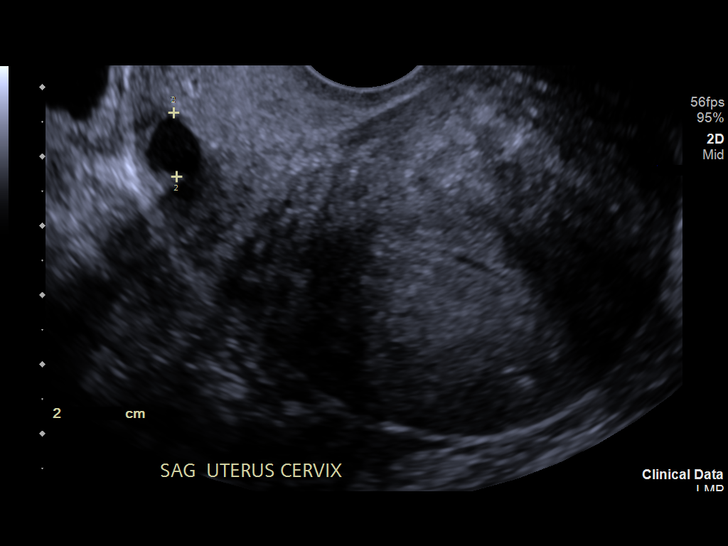
[im 35/52]
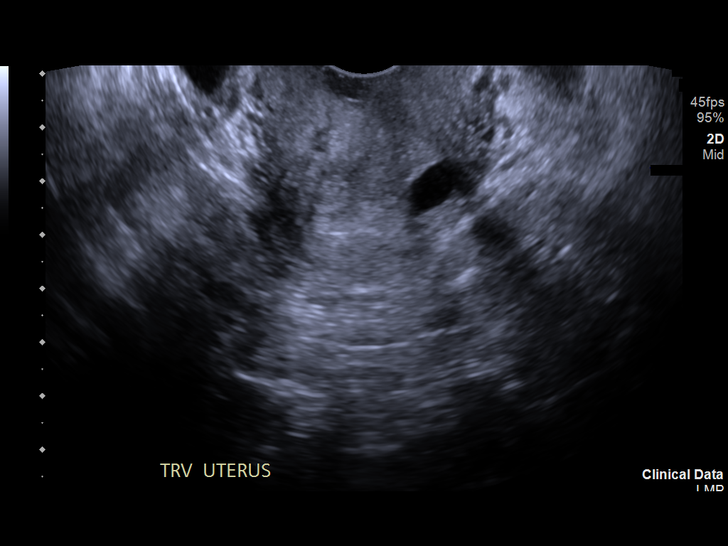
[im 39/52]
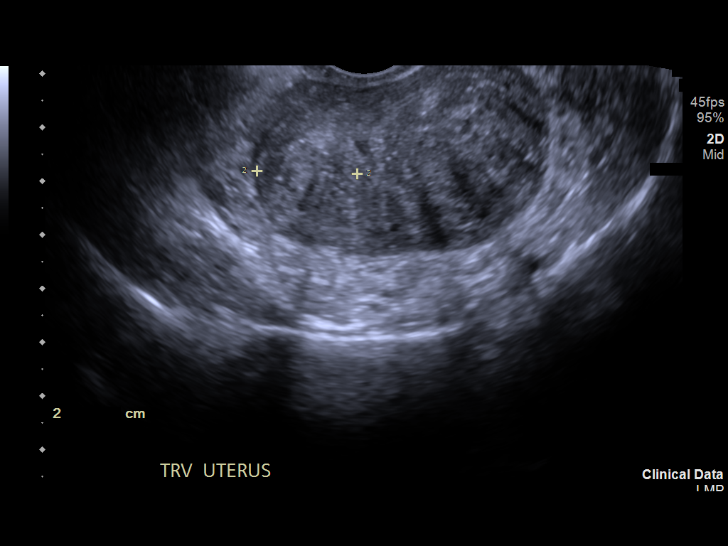
[im 43/52]
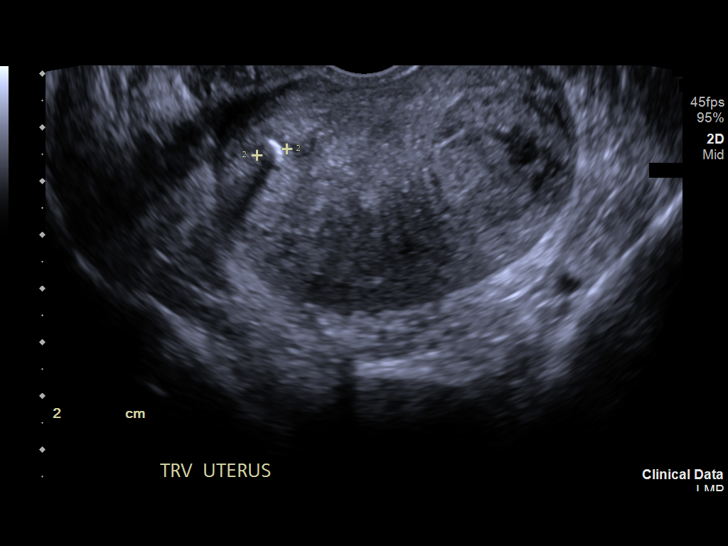
[im 47/52]
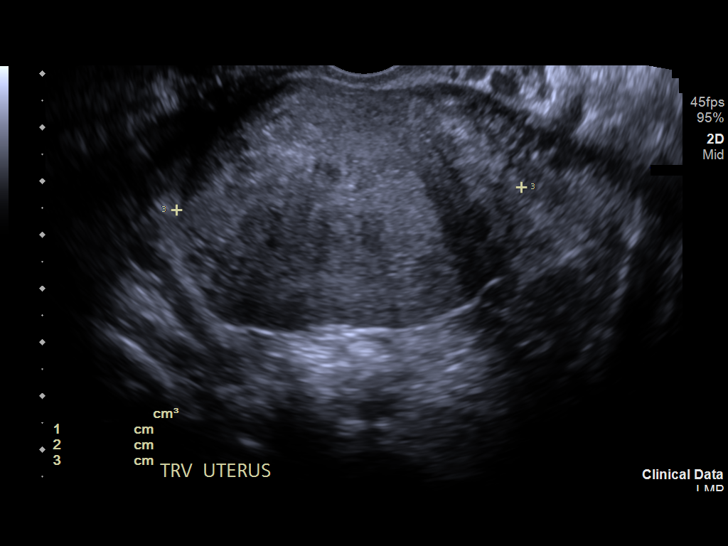
[im 52/52]
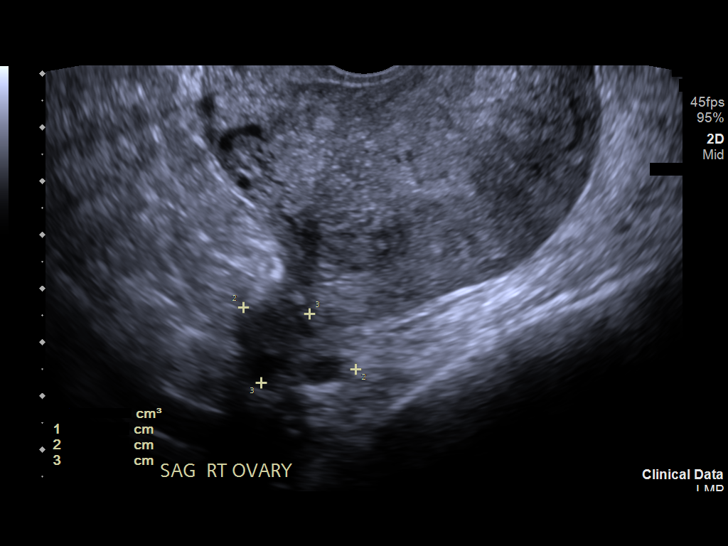

[14 of 25 positions shown; findings below may reference images not displayed]

FINDINGS: Uterus

Measurements: 8.1 x 5.6 x 6.4 cm = volume: 155.1 mL. Multiple
uterine fibroids are seen, measuring up to 2.6 cm in size. The
uterus is retroflexed in nature.

Endometrium

Thickness: 0.4 cm.  No focal abnormality visualized.

Right ovary

Measurements: 2.3 x 1.5 x 1.6 cm = volume: 3.3 mL. Normal
appearance/no adnexal mass.

Left ovary

Measurements: 2.4 x 1.4 x 1.3 cm = volume: 2.0 mL. Normal
appearance/no adnexal mass.

Other findings

No abnormal free fluid.
IMPRESSION: 1. Multiple uterine fibroids noted.
2. Otherwise unremarkable pelvic ultrasound.

## 2019-12-06 ENCOUNTER — Other Ambulatory Visit: Payer: Self-pay | Admitting: Physician Assistant

## 2020-01-01 ENCOUNTER — Other Ambulatory Visit: Payer: Self-pay | Admitting: Family Medicine

## 2020-01-01 DIAGNOSIS — G459 Transient cerebral ischemic attack, unspecified: Secondary | ICD-10-CM

## 2020-03-27 ENCOUNTER — Ambulatory Visit
Admission: RE | Admit: 2020-03-27 | Discharge: 2020-03-27 | Disposition: A | Discharge status: Home / Self Care | Payer: 59 | Source: Ambulatory Visit | Attending: Family Medicine | Admitting: Family Medicine

## 2020-03-27 DIAGNOSIS — G459 Transient cerebral ischemic attack, unspecified: Secondary | ICD-10-CM

## 2020-04-01 ENCOUNTER — Other Ambulatory Visit: Payer: Self-pay | Admitting: Emergency Medicine

## 2020-04-01 DIAGNOSIS — L249 Irritant contact dermatitis, unspecified cause: Secondary | ICD-10-CM

## 2020-04-01 MED ORDER — TRIAMCINOLONE ACETONIDE 0.5 % EX OINT: 1.0000 "application " | TOPICAL_OINTMENT | Freq: Two times a day (BID) | CUTANEOUS | 1 refills | Status: DC | PRN

## 2020-06-03 ENCOUNTER — Other Ambulatory Visit: Payer: Self-pay | Admitting: Physician Assistant

## 2020-06-03 MED ORDER — METOPROLOL SUCCINATE ER 25 MG PO TB24: ORAL_TABLET | ORAL | 0 refills | Status: DC

## 2020-06-05 ENCOUNTER — Other Ambulatory Visit: Payer: Self-pay | Admitting: Physician Assistant

## 2020-06-05 MED ORDER — METOPROLOL SUCCINATE ER 25 MG PO TB24: ORAL_TABLET | ORAL | 0 refills | Status: DC

## 2020-06-07 ENCOUNTER — Other Ambulatory Visit: Payer: Self-pay

## 2020-06-07 ENCOUNTER — Ambulatory Visit (INDEPENDENT_AMBULATORY_CARE_PROVIDER_SITE_OTHER): Payer: 59 | Admitting: Physician Assistant

## 2020-06-07 VITALS — BP 126/80 | HR 63 | Temp 98.0°F | Ht 67.0 in | Wt 179.0 lb

## 2020-06-07 DIAGNOSIS — I10 Essential (primary) hypertension: Secondary | ICD-10-CM

## 2020-06-07 DIAGNOSIS — Z0000 Encounter for general adult medical examination without abnormal findings: Secondary | ICD-10-CM | POA: Diagnosis not present

## 2020-06-07 DIAGNOSIS — E559 Vitamin D deficiency, unspecified: Secondary | ICD-10-CM

## 2020-06-07 DIAGNOSIS — E7800 Pure hypercholesterolemia, unspecified: Secondary | ICD-10-CM

## 2020-06-07 DIAGNOSIS — Z90710 Acquired absence of both cervix and uterus: Secondary | ICD-10-CM

## 2020-06-07 DIAGNOSIS — E538 Deficiency of other specified B group vitamins: Secondary | ICD-10-CM

## 2020-06-07 LAB — CBC WITH DIFFERENTIAL/PLATELET
Basophils Absolute: 0.1 10*3/uL (ref 0.0–0.1)
Basophils Relative: 1.2 % (ref 0.0–3.0)
Eosinophils Absolute: 0.1 10*3/uL (ref 0.0–0.7)
Eosinophils Relative: 2.4 % (ref 0.0–5.0)
HCT: 42.7 % (ref 36.0–46.0)
Hemoglobin: 14.2 g/dL (ref 12.0–15.0)
Lymphocytes Relative: 26.8 % (ref 12.0–46.0)
Lymphs Abs: 1.4 10*3/uL (ref 0.7–4.0)
MCHC: 33.4 g/dL (ref 30.0–36.0)
MCV: 86.9 fl (ref 78.0–100.0)
Monocytes Absolute: 0.4 10*3/uL (ref 0.1–1.0)
Monocytes Relative: 7.3 % (ref 3.0–12.0)
Neutro Abs: 3.2 10*3/uL (ref 1.4–7.7)
Neutrophils Relative %: 62.3 % (ref 43.0–77.0)
Platelets: 255 10*3/uL (ref 150.0–400.0)
RBC: 4.91 Mil/uL (ref 3.87–5.11)
RDW: 13.8 % (ref 11.5–15.5)
WBC: 5.2 10*3/uL (ref 4.0–10.5)

## 2020-06-07 LAB — LIPID PANEL
Cholesterol: 229 mg/dL — ABNORMAL HIGH (ref 0–200)
HDL: 62.2 mg/dL (ref >39.00)
LDL Cholesterol: 151 mg/dL — ABNORMAL HIGH (ref 0–99)
NonHDL: 166.81
Total CHOL/HDL Ratio: 4
Triglycerides: 79 mg/dL (ref 0.0–149.0)
VLDL: 15.8 mg/dL (ref 0.0–40.0)

## 2020-06-07 LAB — COMPREHENSIVE METABOLIC PANEL
ALT: 16 U/L (ref 0–35)
AST: 15 U/L (ref 0–37)
Albumin: 4.5 g/dL (ref 3.5–5.2)
Alkaline Phosphatase: 76 U/L (ref 39–117)
BUN: 11 mg/dL (ref 6–23)
CO2: 25 mEq/L (ref 19–32)
Calcium: 9.1 mg/dL (ref 8.4–10.5)
Chloride: 105 mEq/L (ref 96–112)
Creatinine, Ser: 0.87 mg/dL (ref 0.40–1.20)
GFR: 83.89 mL/min (ref >60.00)
Glucose, Bld: 89 mg/dL (ref 70–99)
Potassium: 4.4 mEq/L (ref 3.5–5.1)
Sodium: 137 mEq/L (ref 135–145)
Total Bilirubin: 0.8 mg/dL (ref 0.2–1.2)
Total Protein: 6.8 g/dL (ref 6.0–8.3)

## 2020-06-07 LAB — VITAMIN D 25 HYDROXY (VIT D DEFICIENCY, FRACTURES): VITD: 67.56 ng/mL (ref 30.00–100.00)

## 2020-06-07 LAB — TSH: TSH: 1.42 u[IU]/mL (ref 0.35–4.50)

## 2020-06-07 LAB — HEMOGLOBIN A1C: Hgb A1c MFr Bld: 5.8 % (ref 4.6–6.5)

## 2020-06-07 NOTE — Patient Instructions (Addendum)
Please go to the lab for blood work.   Our office will call you with your results unless you have chosen to receive results via MyChart.  If your blood work is normal we will follow-up each year for physicals and as scheduled for chronic medical problems.  If anything is abnormal we will treat accordingly and get you in for a follow-up.  You will be contacted to schedule your 3D mammogram.    Preventive Care 72-49 Years Old, Female Preventive care refers to visits with your health care provider and lifestyle choices that can promote health and wellness. This includes:  A yearly physical exam. This may also be called an annual well check.  Regular dental visits and eye exams.  Immunizations.  Screening for certain conditions.  Healthy lifestyle choices, such as eating a healthy diet, getting regular exercise, not using drugs or products that contain nicotine and tobacco, and limiting alcohol use. What can I expect for my preventive care visit? Physical exam Your health care provider will check your:  Height and weight. This may be used to calculate body mass index (BMI), which tells if you are at a healthy weight.  Heart rate and blood pressure.  Skin for abnormal spots. Counseling Your health care provider may ask you questions about your:  Alcohol, tobacco, and drug use.  Emotional well-being.  Home and relationship well-being.  Sexual activity.  Eating habits.  Work and work Statistician.  Method of birth control.  Menstrual cycle.  Pregnancy history. What immunizations do I need?  Influenza (flu) vaccine  This is recommended every year. Tetanus, diphtheria, and pertussis (Tdap) vaccine  You may need a Td booster every 10 years. Varicella (chickenpox) vaccine  You may need this if you have not been vaccinated. Zoster (shingles) vaccine  You may need this after age 14. Measles, mumps, and rubella (MMR) vaccine  You may need at least one dose of MMR  if you were born in 1957 or later. You may also need a second dose. Pneumococcal conjugate (PCV13) vaccine  You may need this if you have certain conditions and were not previously vaccinated. Pneumococcal polysaccharide (PPSV23) vaccine  You may need one or two doses if you smoke cigarettes or if you have certain conditions. Meningococcal conjugate (MenACWY) vaccine  You may need this if you have certain conditions. Hepatitis A vaccine  You may need this if you have certain conditions or if you travel or work in places where you may be exposed to hepatitis A. Hepatitis B vaccine  You may need this if you have certain conditions or if you travel or work in places where you may be exposed to hepatitis B. Haemophilus influenzae type b (Hib) vaccine  You may need this if you have certain conditions. Human papillomavirus (HPV) vaccine  If recommended by your health care provider, you may need three doses over 6 months. You may receive vaccines as individual doses or as more than one vaccine together in one shot (combination vaccines). Talk with your health care provider about the risks and benefits of combination vaccines. What tests do I need? Blood tests  Lipid and cholesterol levels. These may be checked every 5 years, or more frequently if you are over 49 years old.  Hepatitis C test.  Hepatitis B test. Screening  Lung cancer screening. You may have this screening every year starting at age 49 if you have a 30-pack-year history of smoking and currently smoke or have quit within the past 15 years.  Colorectal cancer screening. All adults should have this screening starting at age 49 and continuing until age 49. Your health care provider may recommend screening at age 52 if you are at increased risk. You will have tests every 1-10 years, depending on your results and the type of screening test.  Diabetes screening. This is done by checking your blood sugar (glucose) after you have  not eaten for a while (fasting). You may have this done every 1-3 years.  Mammogram. This may be done every 1-2 years. Talk with your health care provider about when you should start having regular mammograms. This may depend on whether you have a family history of breast cancer.  BRCA-related cancer screening. This may be done if you have a family history of breast, ovarian, tubal, or peritoneal cancers.  Pelvic exam and Pap test. This may be done every 3 years starting at age 49. Starting at age 49, this may be done every 5 years if you have a Pap test in combination with an HPV test. Other tests  Sexually transmitted disease (STD) testing.  Bone density scan. This is done to screen for osteoporosis. You may have this scan if you are at high risk for osteoporosis. 49 Follow these instructions at home: Eating and drinking  Eat a diet that includes fresh fruits and vegetables, whole grains, lean protein, and low-fat dairy.  Take vitamin and mineral supplements as recommended by your health care provider.  Do not drink alcohol if: ? Your health care provider tells you not to drink. ? You are pregnant, may be pregnant, or are planning to become pregnant.  If you drink alcohol: ? Limit how much you have to 0-1 drink a day. ? Be aware of how much alcohol is in your drink. In the [place] one drink equals one 12 oz bottle of beer (355 mL), one 5 oz glass of wine (148 mL), or one 1 oz glass of hard liquor (44 mL). Lifestyle  Take daily care of your teeth and gums.  Stay active. Exercise for at least 30 minutes on 5 or more days each week.  Do not use any products that contain nicotine or tobacco, such as cigarettes, e-cigarettes, and chewing tobacco. If you need help quitting, ask your health care provider.  If you are sexually active, practice safe sex. Use a condom or other form of birth control (contraception) in order to prevent pregnancy and STIs (sexually transmitted infections).  If  told by your health care provider, take low-dose aspirin daily starting at age 49. What's next?  Visit your health care provider once a year for a well check visit.  Ask your health care provider how often you should have your eyes and teeth checked.  Stay up to date on all vaccines. This information is not intended to replace advice given to you by your health care provider. Make sure you discuss any questions you have with your health care provider. Document Revised: 08/18/2018 Document Reviewed: 08/18/2018 Elsevier Patient Education  [employer]. .

## 2020-06-07 NOTE — Progress Notes (Signed)
Patient presents to clinic today for annual exam.  Patient is fasting for labs.  Patient recently had a carotid ultrasound study.  Wants to make sure we are aware of this.  Was told everything looked good.  Patient asymptomatic but had this done giving her family history of stroke.  Health Maintenance: Immunizations --up-to-date. Mammogram --due.  Is followed by GYN but would like [place] to proceed with ordering her 3D mammogram.  Order will be placed. PAP --status post hysterectomy.  Followed by GYN.  Past Medical History:  Diagnosis Date  . Anxiety   . Gilbert syndrome   . Heart murmur    Dr. Jens Som history of heart murmur  . Herpes exposure    Type 1 & 2 ; Dr Garwin Brothers, [place]  . Hyperlipidemia   . Hypertension   . Iron deficiency anemia   . Pre-diabetes   . Premature ventricular contractions   . Skin rash    waist area comes and goes  . Vitamin B 12 deficiency   . Vitamin D deficiency    History of, medication stopped because Vitamin d became too hogh    Past Surgical History:  Procedure Laterality Date  . CESAREAN SECTION     X 2, Dr Garwin Brothers  . IR RADIOLOGIST EVAL & MGMT  08/29/2019  . ROBOTIC ASSISTED LAPAROSCOPIC HYSTERECTOMY AND SALPINGECTOMY Bilateral 10/03/2019   Procedure: XI ROBOTIC ASSISTED LAPAROSCOPIC HYSTERECTOMY AND SALPINGECTOMY;  Surgeon: Servando Salina, MD;  Location: [place];  Service: Gynecology;  Laterality: Bilateral;  . TUBAL LIGATION    . WISDOM TOOTH EXTRACTION      Current Outpatient Medications on File Prior to Visit  Medication Sig Dispense Refill  . ALPRAZolam (XANAX) 0.25 MG tablet Take 1 tablet (0.25 mg total) by mouth 2 (two) times daily as needed for anxiety. 20 tablet 0  . Cholecalciferol (VITAMIN D3) 10000 units TABS Take 1 tablet by mouth daily.    . clotrimazole-betamethasone (LOTRISONE) cream Apply 1 application topically daily as needed (rash).    Marland Kitchen FLUoxetine (PROZAC) 20 MG tablet TAKE 1 TABLET(20 MG) BY MOUTH  DAILY 90 tablet 1  . ibuprofen (ADVIL) 800 MG tablet Take 1 tablet (800 mg total) by mouth every 6 (six) hours as needed for mild pain or moderate pain. 30 tablet 5  . Magnesium Glycinate POWD Takes daily (Patient taking differently: Take 1 tablet by mouth daily. )  0  . metoprolol succinate (TOPROL-XL) 25 MG 24 hr tablet TAKE 1 TABLET(25 MG) BY MOUTH DAILY. No further refills without follow-up appt. 90 tablet 0  . Multiple Vitamin (MULTIVITAMIN) tablet Take 1 tablet by mouth daily.      Marland Kitchen OVER THE COUNTER MEDICATION Place 1 drop under the tongue daily.    Marland Kitchen triamcinolone ointment (KENALOG) 0.5 % Apply 1 application topically 2 (two) times daily as needed (rash). 15 g 1  . Ascorbic Acid (VITAMIN C) 1000 MG tablet Take 1,000 mg by mouth daily. (Patient not taking: Reported on 06/07/2020)    . hydroxypropyl methylcellulose / hypromellose (ISOPTO TEARS / GONIOVISC) 2.5 % ophthalmic solution Place 1 drop into both eyes 3 (three) times daily as needed for dry eyes. (Patient not taking: Reported on 06/07/2020)    . Zinc 30 MG TABS Take 30 mg by mouth every other day. (Patient not taking: Reported on 06/07/2020)     No current facility-administered medications on file prior to visit.    Allergies  Allergen Reactions  . Cephalexin  03/2012 Dyspnea  . Other     Grapefruit due to Fluoxetine  . Penicillins     Unknown reaction as child    Family History  Problem Relation Age of Onset  . Asthma Mother   . Stroke Father 58  . Colon cancer Father   . Diabetes Sister   . Pneumonia Sister   . Hypertension Brother   . Transient ischemic attack Brother 41  . Asthma Maternal Grandmother        died @ 40 of status asthmaticus  . Heart disease Neg Hx     Social History   Socioeconomic History  . Marital status: Married    Spouse name: Not on file  . Number of children: Not on file  . Years of education: Not on file  . Highest education level: Not on file  Occupational History  . Not on  file  Tobacco Use  . Smoking status: Never Smoker  . Smokeless tobacco: Never Used  Vaping Use  . Vaping Use: Never used  Substance and Sexual Activity  . Alcohol use: No  . Drug use: No  . Sexual activity: Not on file  Other Topics Concern  . Not on file  Social History Narrative  . Not on file   Social Determinants of Health   Financial Resource Strain:   . Difficulty of Paying Living Expenses:   Food Insecurity:   . Worried About Charity fundraiser in the Last Year:   . Arboriculturist in the Last Year:   Transportation Needs:   . Film/video editor (Medical):   Marland Kitchen Lack of Transportation (Non-Medical):   Physical Activity:   . Days of Exercise per Week:   . Minutes of Exercise per Session:   Stress:   . Feeling of Stress :   Social Connections:   . Frequency of Communication with Friends and Family:   . Frequency of Social Gatherings with Friends and Family:   . Attends Religious Services:   . Active Member of Clubs or Organizations:   . Attends Archivist Meetings:   Marland Kitchen Marital Status:   Intimate Partner Violence:   . Fear of Current or Ex-Partner:   . Emotionally Abused:   Marland Kitchen Physically Abused:   . Sexually Abused:    Review of Systems  Constitutional: Negative for fever and weight loss.  HENT: Negative for ear discharge, ear pain, hearing loss and tinnitus.   Eyes: Negative for blurred vision, double vision, photophobia and pain.  Respiratory: Negative for cough and shortness of breath.   Cardiovascular: Negative for chest pain and palpitations.  Gastrointestinal: Negative for abdominal pain, blood in stool, constipation, diarrhea, heartburn, melena, nausea and vomiting.  Genitourinary: Negative for dysuria, flank pain, frequency, hematuria and urgency.  Musculoskeletal: Negative for falls.  Neurological: Negative for dizziness, loss of consciousness and headaches.  Endo/Heme/Allergies: Negative for environmental allergies.    Psychiatric/Behavioral: Negative for depression, hallucinations, substance abuse and suicidal ideas. The patient is not nervous/anxious and does not have insomnia.    BP 126/80   Pulse 63   Temp 98 F (36.7 C) (Temporal)   Ht 5\' 7"  (1.702 m)   Wt 179 lb (81.2 kg)   SpO2 99%   BMI 28.04 kg/m   Physical Exam Vitals reviewed.  HENT:     Head: Normocephalic and atraumatic.     Right Ear: Tympanic membrane, ear canal and external ear normal.     Left Ear: Tympanic membrane, ear canal  and external ear normal.     Nose: Nose normal. No mucosal edema.     Mouth/Throat:     Pharynx: Uvula midline. No oropharyngeal exudate or posterior oropharyngeal erythema.  Eyes:     Conjunctiva/sclera: Conjunctivae normal.     Pupils: Pupils are equal, round, and reactive to light.  Neck:     Thyroid: No thyromegaly.  Cardiovascular:     Rate and Rhythm: Normal rate and regular rhythm.     Heart sounds: Normal heart sounds.  Pulmonary:     Effort: Pulmonary effort is normal. No respiratory distress.     Breath sounds: Normal breath sounds. No wheezing or rales.  Abdominal:     General: Bowel sounds are normal. There is no distension.     Palpations: Abdomen is soft. There is no mass.     Tenderness: There is no abdominal tenderness. There is no guarding or rebound.  Musculoskeletal:     Cervical back: Neck supple.  Lymphadenopathy:     Cervical: No cervical adenopathy.  Skin:    General: Skin is warm and dry.     Findings: No rash.  Neurological:     Mental Status: She is alert and oriented to person, place, and time.     Cranial Nerves: No cranial nerve deficit.     Assessment/Plan: 1. Visit for preventive health examination Depression screen negative. Health Maintenance reviewed. Preventive schedule discussed and handout given in AVS. Will obtain fasting labs today.  - MM 3D SCREEN BREAST BILATERAL; Future - CBC with Differential/Platelet - Comprehensive metabolic panel -  Lipid panel - Hemoglobin A1c - TSH  2. Essential hypertension BP normotensive.  Asymptomatic.  Repeat labs today.  Continue current regimen.  Dietary and exercise recommendations reviewed with patient.  3. Pure hypercholesterolemia Dietary and exercise recommendations reviewed with patient.  Repeat fasting lipids today.  Discussed with her family history of lipids and not improved from last check really should consider starting statin medication.  Patient voiced understanding and agreement.  4. Vitamin D deficiency Prior history.  Is taking OTC supplement daily.  Repeat vitamin D level today. - Vitamin D (25 hydroxy)  5. Vitamin B12 deficiency Positive history repeat B12 level today.  6. S/P hysterectomy Patient requesting hormone levels to be checked.  Is questioning if she is in a perimenopausal state.  Will check estradiol as well as FSH and LH today. - Estradiol - FSH/LH  This visit occurred during the SARS-CoV-2 public health emergency.  Safety protocols were in place, including screening questions prior to the visit, additional usage of staff PPE, and extensive cleaning of exam room while observing appropriate contact time as indicated for disinfecting solutions.    Leeanne Rio, PA-C

## 2020-06-08 LAB — ESTRADIOL: Estradiol: 88 pg/mL

## 2020-06-08 LAB — FSH/LH
FSH: 3.5 m[IU]/mL
LH: 4.6 m[IU]/mL

## 2020-06-28 ENCOUNTER — Encounter: Payer: Self-pay | Admitting: Physician Assistant

## 2020-07-08 ENCOUNTER — Ambulatory Visit: Payer: 59 | Admitting: Physician Assistant

## 2020-07-08 ENCOUNTER — Other Ambulatory Visit: Payer: Self-pay

## 2020-07-08 ENCOUNTER — Encounter: Payer: Self-pay | Admitting: Physician Assistant

## 2020-07-08 VITALS — BP 112/72 | HR 93 | Temp 98.3°F | Resp 16 | Ht 67.0 in | Wt 180.0 lb

## 2020-07-08 DIAGNOSIS — M62838 Other muscle spasm: Secondary | ICD-10-CM | POA: Diagnosis not present

## 2020-07-08 DIAGNOSIS — M792 Neuralgia and neuritis, unspecified: Secondary | ICD-10-CM | POA: Diagnosis not present

## 2020-07-08 MED ORDER — METHOCARBAMOL 500 MG PO TABS: 500.0000 mg | ORAL_TABLET | Freq: Every day | ORAL | 0 refills | Status: DC

## 2020-07-08 MED ORDER — GABAPENTIN 100 MG PO CAPS: 100.0000 mg | ORAL_CAPSULE | Freq: Two times a day (BID) | ORAL | 0 refills | Status: DC

## 2020-07-08 NOTE — Patient Instructions (Signed)
Please avoid heavy lifting and overexertion. Massages when you are able. Apply heating pad to the R neck for 10-15 minutes 1-2 x daily.  Can apply topical Icy hot to the area as well. Take the Robaxin at night. Avoid pulling the hair in a tight ponytail. Start the Gabapentin twice daily as directed.  If symptoms are not resolving, I would recommend we get further assessment with Neurologia for the neuralgia symptoms.

## 2020-07-08 NOTE — Progress Notes (Signed)
Patient presents to clinic today c/o intermittent tingling and sensitivity of the R side of her forehead and temporal region of the scalp. Notes about 2 weeks ago waking up with pain of her scalp that woke her from sleep. Quickly resolved. Notes since then having continued scalp sensitivity. Denies rash. Denies true headache. Has noted some neck tension,mainly R-sided as well. Has been stressed with home and work life but denies any true change from baseline. Notes pain and sensitivity is not impacting her day to day life. Just unsure of what is causing the issue.   Past Medical History:  Diagnosis Date  . Anxiety   . Gilbert syndrome   . Heart murmur    Dr. Jens Som history of heart murmur  . Herpes exposure    Type 1 & 2 ; Dr Garwin Brothers, [place]  . Hyperlipidemia   . Hypertension   . Iron deficiency anemia   . Pre-diabetes   . Premature ventricular contractions   . Skin rash    waist area comes and goes  . Vitamin B 12 deficiency   . Vitamin D deficiency    History of, medication stopped because Vitamin d became too hogh    Current Outpatient Medications on File Prior to Visit  Medication Sig Dispense Refill  . ALPRAZolam (XANAX) 0.25 MG tablet Take 1 tablet (0.25 mg total) by mouth 2 (two) times daily as needed for anxiety. 20 tablet 0  . Cholecalciferol (VITAMIN D3) 10000 units TABS Take 1 tablet by mouth daily.    . clotrimazole-betamethasone (LOTRISONE) cream Apply 1 application topically daily as needed (rash).    Marland Kitchen FLUoxetine (PROZAC) 20 MG tablet TAKE 1 TABLET(20 MG) BY MOUTH DAILY 90 tablet 1  . ibuprofen (ADVIL) 800 MG tablet Take 1 tablet (800 mg total) by mouth every 6 (six) hours as needed for mild pain or moderate pain. 30 tablet 5  . Magnesium Glycinate POWD Takes daily (Patient taking differently: Take 1 tablet by mouth daily. )  0  . metoprolol succinate (TOPROL-XL) 25 MG 24 hr tablet TAKE 1 TABLET(25 MG) BY MOUTH DAILY. No further refills without follow-up appt. 90 tablet 0   . Multiple Vitamin (MULTIVITAMIN) tablet Take 1 tablet by mouth daily.      Marland Kitchen OVER THE COUNTER MEDICATION Place 1 drop under the tongue daily.    Marland Kitchen triamcinolone ointment (KENALOG) 0.5 % Apply 1 application topically 2 (two) times daily as needed (rash). 15 g 1   No current facility-administered medications on file prior to visit.    Allergies  Allergen Reactions  . Cephalexin      03/2012 Dyspnea  . Other     Grapefruit due to Fluoxetine  . Penicillins     Unknown reaction as child    Family History  Problem Relation Age of Onset  . Asthma Mother   . Stroke Father 21  . Colon cancer Father   . Diabetes Sister   . Pneumonia Sister   . Hypertension Brother   . Transient ischemic attack Brother 41  . Asthma Maternal Grandmother        died @ 67 of status asthmaticus  . Heart disease Neg Hx     Social History   Socioeconomic History  . Marital status: Married    Spouse name: Not on file  . Number of children: Not on file  . Years of education: Not on file  . Highest education level: Not on file  Occupational History  . Not on file  Tobacco Use  . Smoking status: Never Smoker  . Smokeless tobacco: Never Used  Vaping Use  . Vaping Use: Never used  Substance and Sexual Activity  . Alcohol use: No  . Drug use: No  . Sexual activity: Not on file  Other Topics Concern  . Not on file  Social History Narrative  . Not on file   Social Determinants of Health   Financial Resource Strain:   . Difficulty of Paying Living Expenses:   Food Insecurity:   . Worried About Charity fundraiser in the Last Year:   . Arboriculturist in the Last Year:   Transportation Needs:   . Film/video editor (Medical):   Marland Kitchen Lack of Transportation (Non-Medical):   Physical Activity:   . Days of Exercise per Week:   . Minutes of Exercise per Session:   Stress:   . Feeling of Stress :   Social Connections:   . Frequency of Communication with Friends and Family:   . Frequency of  Social Gatherings with Friends and Family:   . Attends Religious Services:   . Active Member of Clubs or Organizations:   . Attends Archivist Meetings:   Marland Kitchen Marital Status:    Review of Systems - See HPI.  All other ROS are negative.  Wt 180 lb (81.6 kg)   BMI 28.19 kg/m   Physical Exam Vitals reviewed.  Constitutional:      Appearance: She is well-developed.  HENT:     Head: Normocephalic and atraumatic.  Eyes:     General: No visual field deficit. Cardiovascular:     Rate and Rhythm: Normal rate and regular rhythm.  Pulmonary:     Effort: Pulmonary effort is normal.  Musculoskeletal:     Cervical back: No rigidity. Muscular tenderness present. No spinous process tenderness. Normal range of motion.  Neurological:     Mental Status: She is alert.     Cranial Nerves: No cranial nerve deficit, dysarthria or facial asymmetry.     Motor: No weakness.     Coordination: Coordination normal.     Comments: Decreased sensation and hyperesthesia noted in V3 of R trigeminal nerve distribution. Some change also noted in V1 as well.      Recent Results (from the past 2160 hour(s))  CBC with Differential/Platelet     Status: None   Collection Time: 06/07/20  9:28 AM  Result Value Ref Range   WBC 5.2 4.0 - 10.5 K/uL   RBC 4.91 3.87 - 5.11 Mil/uL   Hemoglobin 14.2 12.0 - 15.0 g/dL   HCT 42.7 36 - 46 %   MCV 86.9 78.0 - 100.0 fl   MCHC 33.4 30.0 - 36.0 g/dL   RDW 13.8 11.5 - 15.5 %   Platelets 255.0 150 - 400 K/uL   Neutrophils Relative % 62.3 43 - 77 %   Lymphocytes Relative 26.8 12 - 46 %   Monocytes Relative 7.3 3 - 12 %   Eosinophils Relative 2.4 0 - 5 %   Basophils Relative 1.2 0 - 3 %   Neutro Abs 3.2 1.4 - 7.7 K/uL   Lymphs Abs 1.4 0.7 - 4.0 K/uL   Monocytes Absolute 0.4 0 - 1 K/uL   Eosinophils Absolute 0.1 0 - 0 K/uL   Basophils Absolute 0.1 0 - 0 K/uL  Comprehensive metabolic panel     Status: None   Collection Time: 06/07/20  9:28 AM  Result Value Ref  Range  Sodium 137 135 - 145 mEq/L   Potassium 4.4 3.5 - 5.1 mEq/L   Chloride 105 96 - 112 mEq/L   CO2 25 19 - 32 mEq/L   Glucose, Bld 89 70 - 99 mg/dL   BUN 11 6 - 23 mg/dL   Creatinine, Ser 0.87 0.40 - 1.20 mg/dL   Total Bilirubin 0.8 0.2 - 1.2 mg/dL   Alkaline Phosphatase 76 39 - 117 U/L   AST 15 0 - 37 U/L   ALT 16 0 - 35 U/L   Total Protein 6.8 6.0 - 8.3 g/dL   Albumin 4.5 3.5 - 5.2 g/dL   GFR 83.89 >60.00 mL/min   Calcium 9.1 8.4 - 10.5 mg/dL  Lipid panel     Status: Abnormal   Collection Time: 06/07/20  9:28 AM  Result Value Ref Range   Cholesterol 229 (H) 0 - 200 mg/dL    Comment: ATP III Classification       Desirable:  < 200 mg/dL               Borderline High:  200 - 239 mg/dL          High:  > = 240 mg/dL   Triglycerides 79.0 0 - 149 mg/dL    Comment: Normal:  <150 mg/dLBorderline High:  150 - 199 mg/dL   HDL 62.20 >39.00 mg/dL   VLDL 15.8 0.0 - 40.0 mg/dL   LDL Cholesterol 151 (H) 0 - 99 mg/dL   Total CHOL/HDL Ratio 4     Comment:                Men          Women1/2 Average Risk     3.4          3.3Average Risk          5.0          4.42X Average Risk          9.6          7.13X Average Risk          15.0          11.0                       NonHDL 166.81     Comment: NOTE:  Non-HDL goal should be 30 mg/dL higher than patient's LDL goal (i.e. LDL goal of < 70 mg/dL, would have non-HDL goal of < 100 mg/dL)  Hemoglobin A1c     Status: None   Collection Time: 06/07/20  9:28 AM  Result Value Ref Range   Hgb A1c MFr Bld 5.8 4.6 - 6.5 %    Comment: Glycemic Control Guidelines for People with Diabetes:Non Diabetic:  <6%Goal of Therapy: <7%Additional Action Suggested:  >8%   TSH     Status: None   Collection Time: 06/07/20  9:28 AM  Result Value Ref Range   TSH 1.42 0.35 - 4.50 uIU/mL  Vitamin D (25 hydroxy)     Status: None   Collection Time: 06/07/20  9:28 AM  Result Value Ref Range   VITD 67.56 30.00 - 100.00 ng/mL  Estradiol     Status: None   Collection Time:  06/07/20  9:28 AM  Result Value Ref Range   Estradiol 88 pg/mL    Comment:       Reference Range         Follicular Phase:    22-025  Mid-Cycle:           64-357         Luteal Phase:        44-010         Postmenopausal:      < or = 31 . Reference range established on post-pubertal patient population. No pre-pubertal reference range established using this assay. For any patients for whom low Estradiol levels are anticipated (e.g. males, pre-pubertal children and hypogonadal/post-menopausal  females), the [employer] Estradiol, Ultrasensitive, LCMSMS assay is recommended (order code [phone]). . Please note: patients being treated with the drug  fulvestrant (Faslodex(R)) have demonstrated significant  interference in immunoassay methods for estradiol  measurement. The cross reactivity could lead to falsely  elevated estradiol test results leading to an  inappropriate clinical assessment of estrogen status. [facility] order code [id number]-Estradiol,  Ultrasensitive LC/MS/MS demonstrates negligible cross  re activity with fulvestrant.   FSH/LH     Status: None   Collection Time: 06/07/20  9:28 AM  Result Value Ref Range   FSH 3.5 mIU/mL    Comment:                     Reference Range .              Follicular Phase       6.6-44.0              Mid-cycle Peak         3.1-17.7              Luteal Phase           1.5- 9.1              Postmenopausal       23.0-116.3              .    LH 4.6 mIU/mL    Comment:     Reference Range Follicular Phase  3.4-74.2 Mid-Cycle Peak    8.7-76.3 Luteal Phase      0.5-16.9 Postmenopausal    10.0-54.7     Assessment/Plan: 1. Trapezius muscle spasm Tension and spasm of R trapezius noted on examination.Stress relief tactics reviewed. Massage recommending. Heating pad to the area. Rx short course of Robaxin in the evening to see if this helps. May benefit from PT if not resolving with conservative measures.    - methocarbamol (ROBAXIN) 500 MG tablet; Take 1 tablet (500 mg total) by mouth at bedtime.  Dispense: 15 tablet; Refill: 0  2. Neuralgia Seems most consistent with trigeminal neuralgia with hyperesthesia. Mainly V3 effected based off of examination. Supportive measures reviewed. She is to take hair out of pulled pack orientation as this is causing further pulling on the nerves and discomfort. Rx Gabapentin BID. Close follow-up scheduled. If not improving or being persistent will refer to neurology.  - gabapentin (NEURONTIN) 100 MG capsule; Take 1 capsule (100 mg total) by mouth 2 (two) times daily.  Dispense: 60 capsule; Refill: 0  This visit occurred during the SARS-CoV-2 public health emergency.  Safety protocols were in place, including screening questions prior to the visit, additional usage of staff PPE, and extensive cleaning of exam room while observing appropriate contact time as indicated for disinfecting solutions.      Leeanne Rio, PA-C

## 2020-07-12 ENCOUNTER — Ambulatory Visit
Admission: RE | Admit: 2020-07-12 | Discharge: 2020-07-12 | Disposition: A | Discharge status: Home / Self Care | Payer: 59 | Source: Ambulatory Visit | Attending: Physician Assistant | Admitting: Physician Assistant

## 2020-07-12 ENCOUNTER — Other Ambulatory Visit: Payer: Self-pay

## 2020-07-12 DIAGNOSIS — Z0000 Encounter for general adult medical examination without abnormal findings: Secondary | ICD-10-CM

## 2020-08-08 ENCOUNTER — Other Ambulatory Visit: Payer: Self-pay

## 2020-08-08 ENCOUNTER — Encounter: Payer: Self-pay | Admitting: Physician Assistant

## 2020-08-08 ENCOUNTER — Ambulatory Visit: Payer: 59 | Admitting: Physician Assistant

## 2020-08-08 DIAGNOSIS — R21 Rash and other nonspecific skin eruption: Secondary | ICD-10-CM | POA: Diagnosis not present

## 2020-08-08 DIAGNOSIS — L910 Hypertrophic scar: Secondary | ICD-10-CM | POA: Diagnosis not present

## 2020-08-08 MED ORDER — CLOBETASOL PROPIONATE 0.05 % EX OINT: 1.0000 "application " | TOPICAL_OINTMENT | Freq: Two times a day (BID) | CUTANEOUS | 3 refills | Status: DC

## 2020-08-08 MED ORDER — TRIAMCINOLONE ACETONIDE 40 MG/ML IJ SUSP
40.0000 mg | Freq: Once | INTRAMUSCULAR | Status: AC
  Administered 2020-08-08: 40 mg via INTRADERMAL

## 2020-08-08 NOTE — Progress Notes (Signed)
   Follow-Up Visit   Subjective  [name] is a 49 y.o. female who presents for the following: new problem (Pt stated--had hysterectomy last year and develope keliods).   The following portions of the chart were reviewed this encounter and updated as appropriate: Tobacco  Allergies  Meds  Problems  Med Hx  Surg Hx  Fam Hx      Objective  Well appearing patient in no apparent distress; mood and affect are within normal limits.  A focused examination was performed including abdomen, legs, trunk. Relevant physical exam findings are noted in the Assessment and Plan.  Objective  Left Abdomen (side) - Lower, Left Abdomen (side) - Upper (2), Right Abdomen (side) - Lower, Right Flank: Firm pink dermal papules/plaques c/w keloid.  Objective  Left Flank, Left Hip:  Thin scaly erythematous papules coalescing to plaques.   Assessment & Plan  Keloid scar (5) Left Abdomen (side) - Upper (2); Left Abdomen (side) - Lower; Right Abdomen (side) - Lower; Right Flank  Intralesional injection - Left Abdomen (side) - Lower, Left Abdomen (side) - Upper (2), Right Abdomen (side) - Lower, Right Flank Location: Abdomen  Informed Consent: Discussed risks (infection, pain, bleeding, bruising, thinning of the skin, loss of skin pigment,  Indentation, lack of resolution, and recurrence of lesion) and benefits of the procedure, as well as the alternatives. Informed consent was obtained. Preparation: The area was prepared in a standard fashion.   Procedure Details: An intralesional injection was performed with Kenalog 40 mg/cc. 0.5 cc in total were injected.  Total number of injections: 5  Plan: The patient was instructed on post-op care. Recommend OTC analgesia as needed for pain.   clobetasol ointment (TEMOVATE) 0.05 % - Left Abdomen (side) - Lower, Left Abdomen (side) - Upper (2), Right Abdomen (side) - Lower, Right Flank  Rash and other nonspecific skin eruption (2) Left Flank; Left  Hip  Clobetasol ointment. Observe if rash recurs with OTC NSAIDS.   I, Gwendlyn Hanback, PA-C, have reviewed all documentation's for this visit.  The documentation on 08/08/20 for the exam, diagnosis, procedures and orders are all accurate and complete.

## 2020-08-15 ENCOUNTER — Other Ambulatory Visit: Payer: Self-pay | Admitting: Emergency Medicine

## 2020-08-15 DIAGNOSIS — F411 Generalized anxiety disorder: Secondary | ICD-10-CM

## 2020-08-15 NOTE — Telephone Encounter (Signed)
Xanax last rx 04/05/19 #20 LOV: 07/08/20 Trapezius muscle spasm No CSC

## 2020-08-16 MED ORDER — ALPRAZOLAM 0.25 MG PO TABS: 0.2500 mg | ORAL_TABLET | Freq: Two times a day (BID) | ORAL | 0 refills | Status: DC | PRN

## 2020-11-29 ENCOUNTER — Other Ambulatory Visit: Payer: Self-pay | Admitting: Physician Assistant

## 2020-11-29 DIAGNOSIS — F411 Generalized anxiety disorder: Secondary | ICD-10-CM

## 2020-11-29 MED ORDER — FLUOXETINE HCL 20 MG PO TABS: ORAL_TABLET | ORAL | 1 refills | Status: DC

## 2020-12-02 ENCOUNTER — Other Ambulatory Visit: Payer: Self-pay | Admitting: Physician Assistant

## 2020-12-02 MED ORDER — METOPROLOL SUCCINATE ER 25 MG PO TB24: ORAL_TABLET | ORAL | 0 refills | Status: DC

## 2021-01-02 ENCOUNTER — Other Ambulatory Visit: Payer: Self-pay | Admitting: Physician Assistant

## 2021-01-15 ENCOUNTER — Telehealth: Payer: Self-pay | Admitting: Physician Assistant

## 2021-01-15 DIAGNOSIS — F411 Generalized anxiety disorder: Secondary | ICD-10-CM

## 2021-01-15 DIAGNOSIS — I10 Essential (primary) hypertension: Secondary | ICD-10-CM

## 2021-01-15 MED ORDER — METOPROLOL SUCCINATE ER 25 MG PO TB24: ORAL_TABLET | ORAL | 1 refills | Status: DC

## 2021-01-15 MED ORDER — FLUOXETINE HCL 20 MG PO TABS
ORAL_TABLET | ORAL | 1 refills | Status: DC
Start: 2021-01-15 — End: 2021-09-08

## 2021-01-15 NOTE — Telephone Encounter (Signed)
Advised patient her medications were sent to the pharmacy.

## 2021-01-15 NOTE — Telephone Encounter (Signed)
Pt called in questioning the note on her Metoprolol succinate and prozac. She wanted to know when she should follow up and will her scripts be taken care of. Pt can be reached at the home # and she uses [facility] on [address] and [address].  Please advise

## 2021-05-06 ENCOUNTER — Encounter: Payer: Self-pay | Admitting: Physician Assistant

## 2021-05-06 ENCOUNTER — Other Ambulatory Visit: Payer: Self-pay

## 2021-05-06 ENCOUNTER — Ambulatory Visit: Payer: 59 | Admitting: Physician Assistant

## 2021-05-06 DIAGNOSIS — L281 Prurigo nodularis: Secondary | ICD-10-CM

## 2021-05-06 DIAGNOSIS — L910 Hypertrophic scar: Secondary | ICD-10-CM | POA: Diagnosis not present

## 2021-05-06 MED ORDER — ALCLOMETASONE DIPROPIONATE 0.05 % EX CREA: TOPICAL_CREAM | Freq: Two times a day (BID) | CUTANEOUS | 3 refills | Status: DC | PRN

## 2021-05-06 MED ORDER — TRIAMCINOLONE ACETONIDE 10 MG/ML IJ SUSP
20.0000 mg | Freq: Once | INTRAMUSCULAR | Status: AC
  Administered 2021-05-06: 20 mg

## 2021-05-06 NOTE — Progress Notes (Signed)
   Follow-Up Visit   Subjective  [name] is a 50 y.o. female who presents for the following: Keloid (Here to follow up on keloids on abdomen. Was injected with kenalog 40 and did really well. Patient has on near her pubic area that did not get injected last visit. /) and Skin Problem (Has some under the skin bumps on each jaw line area she wants to know if there is a cream she can use that would help them. They get dry and itchy per patient. ).   The following portions of the chart were reviewed this encounter and updated as appropriate:  Tobacco  Allergies  Meds  Problems  Med Hx  Surg Hx  Fam Hx      Objective  Well appearing patient in no apparent distress; mood and affect are within normal limits.  A focused examination was performed including abdomen and pubic area. Relevant physical exam findings are noted in the Assessment and Plan.  Objective  Left Abdomen (side) - Lower, Left Suprapubic Area: Firm pink dermal papules/plaques c/w keloid.   Objective  Head - Anterior (Face), Left Anterior Mandible, Right Anterior Mandible: Small confluent plaques with hyperpigmentation.  Assessment & Plan  Keloid (2) Left Abdomen (side) - Lower; Left Suprapubic Area  Intralesional injection - Left Abdomen (side) - Lower, Left Suprapubic Area Location: pubic area  Informed Consent: Discussed risks (infection, pain, bleeding, bruising, thinning of the skin, loss of skin pigment, lack of resolution, and recurrence of lesion) and benefits of the procedure, as well as the alternatives. Informed consent was obtained. Preparation: The area was prepared a standard fashion.  Anesthesia:Lidocaine 2% without epinephrine  Procedure Details: An intralesional injection was performed with Kenalog 20 mg/cc.  cc in total were injected.  Total number of injections: 1  Plan: The patient was instructed on post-op care. Recommend OTC analgesia as needed for pain.   Prurigo nodularis (3) Head  - Anterior (Face); Left Anterior Mandible; Right Anterior Mandible  alclomethasone (ACLOVATE) 0.05 % cream - Head - Anterior (Face), Left Anterior Mandible, Right Anterior Mandible   I, Adalis Gatti, PA-C, have reviewed all documentation's for this visit.  The documentation on 05/06/21 for the exam, diagnosis, procedures and orders are all accurate and complete.

## 2021-05-12 ENCOUNTER — Telehealth: Payer: Self-pay | Admitting: Physician Assistant

## 2021-05-12 NOTE — Telephone Encounter (Signed)
Patient left message on office voice mail that the area that [facility], PA-C removed something from seems to have turned yellow and she wants to know what she can put on it.

## 2021-05-12 NOTE — Telephone Encounter (Signed)
Patient calling stating that area where keloid was removed last week might be infected. She states that area is a light yellow color. Slight drainage (clear in color) No swelling, redness, pain or hot to touch.

## 2021-05-12 NOTE — Telephone Encounter (Signed)
Phone call to patient to inform her that if she believes that the site is infected then she will need to schedule an appointment to be seen for a wound check..  Voicemail left for patient to give the office a call back.

## 2021-06-18 IMAGING — MG DIGITAL SCREENING BILAT W/ TOMO W/ CAD
8 series · 9 of 24 positions shown · non-contrast
Comparison: Previous exam(s).

CLINICAL DATA: Screening.

EXAM:
DIGITAL SCREENING BILATERAL MAMMOGRAM WITH TOMO AND CAD

[L CC synth-2D]
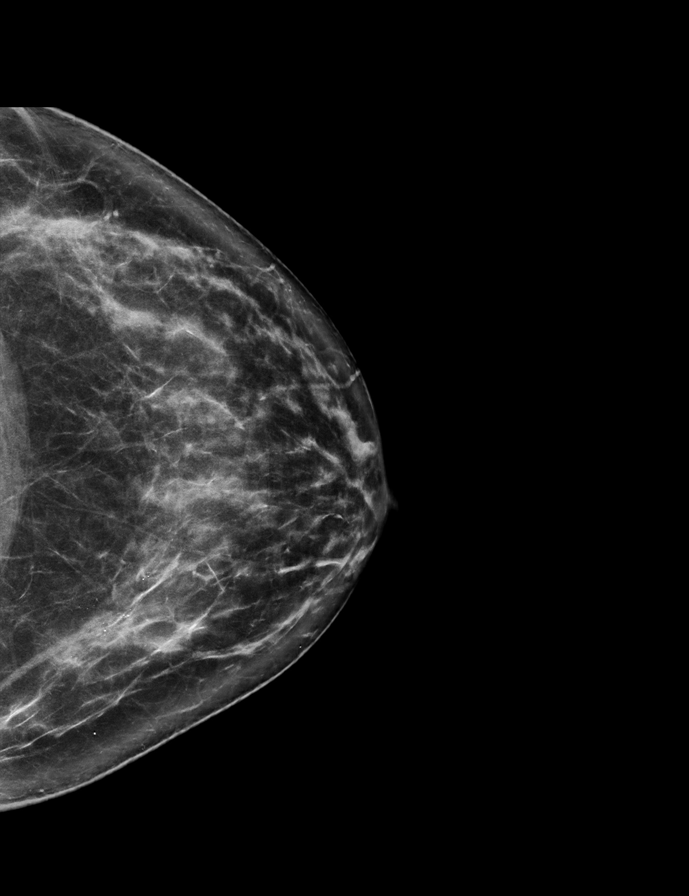

[R CC synth-2D]
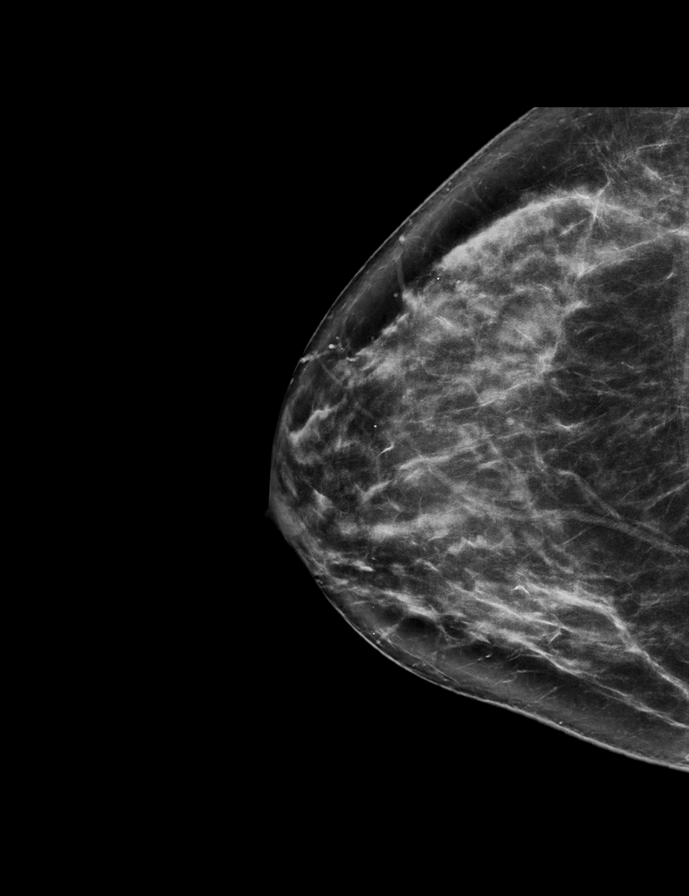

[L MLO synth-2D]
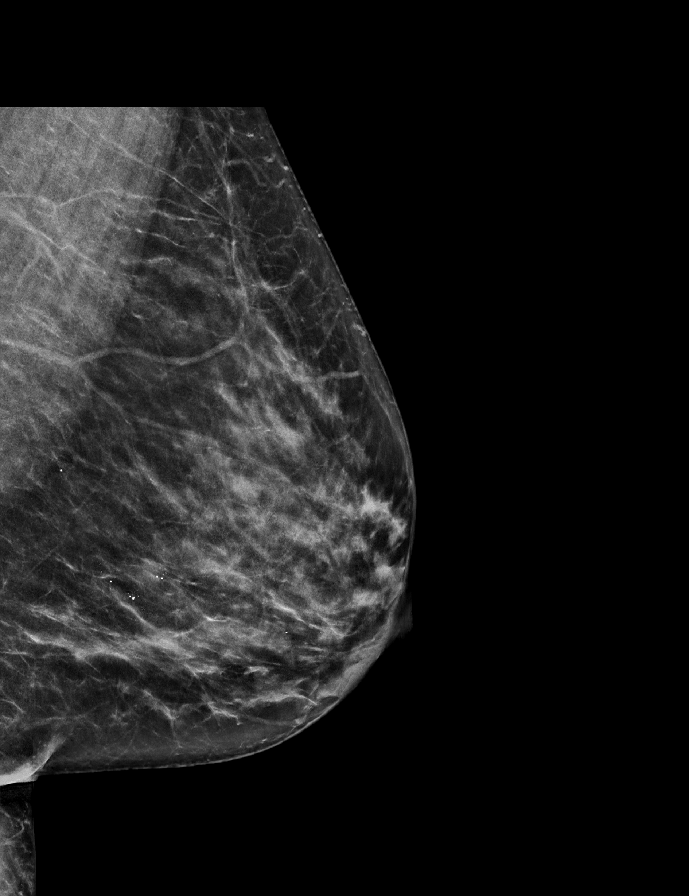

[R MLO synth-2D]
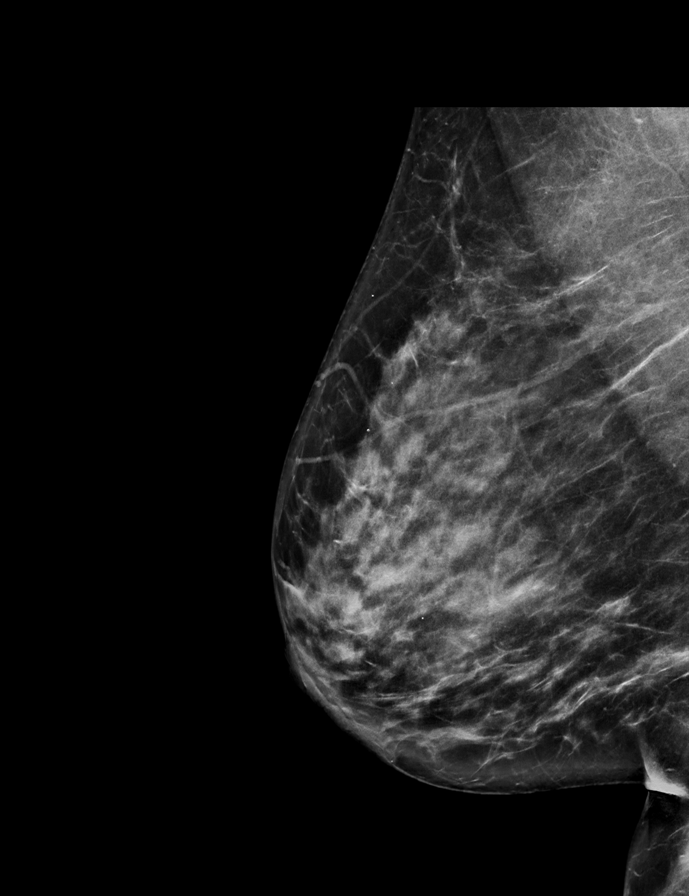

[L CC tomo · 2 of 71 frames shown]
[frame 23/71]
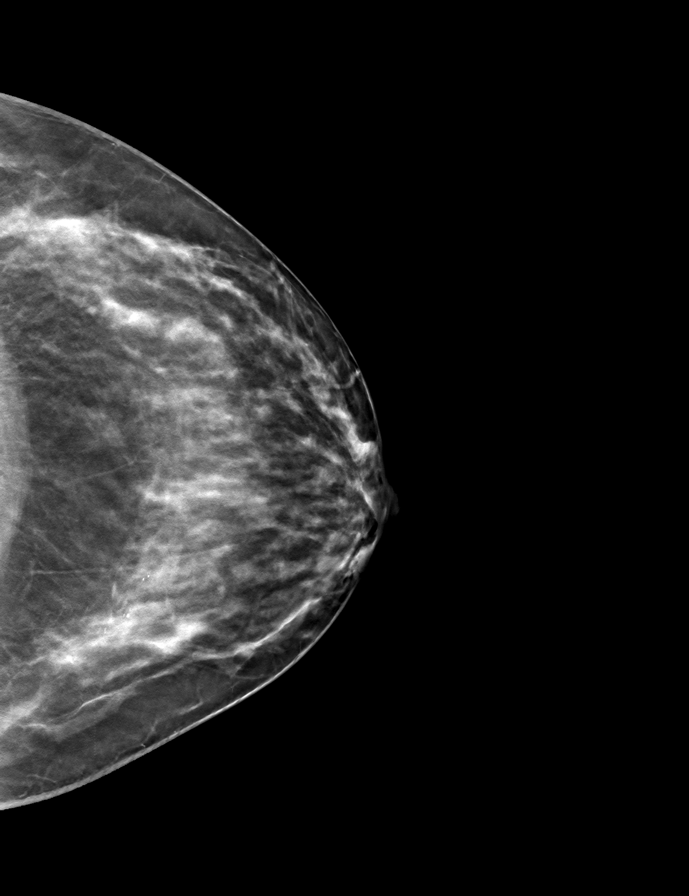
[frame 36/71]
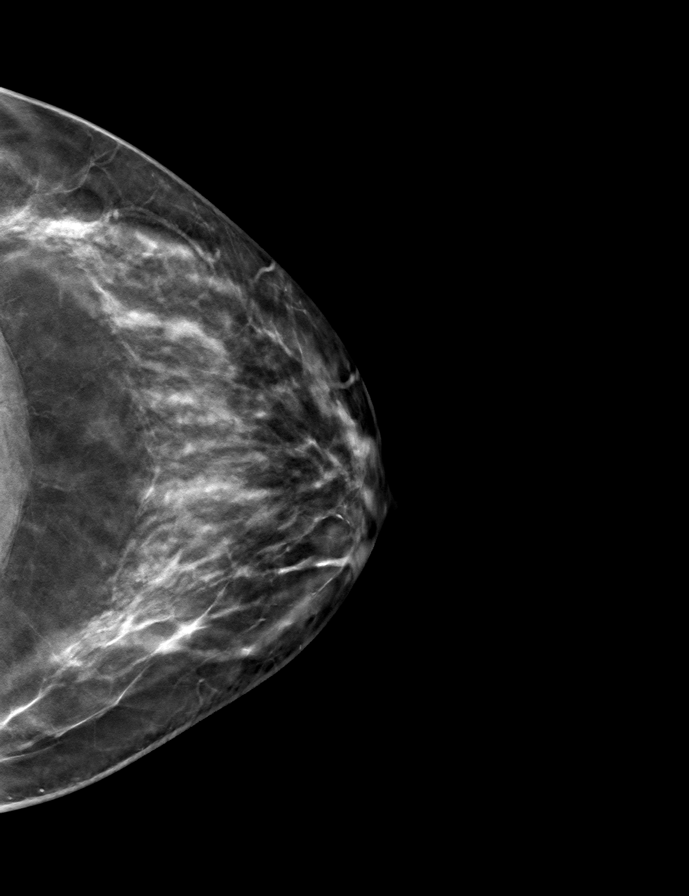

[R CC tomo · tomo slice 35/70.0]
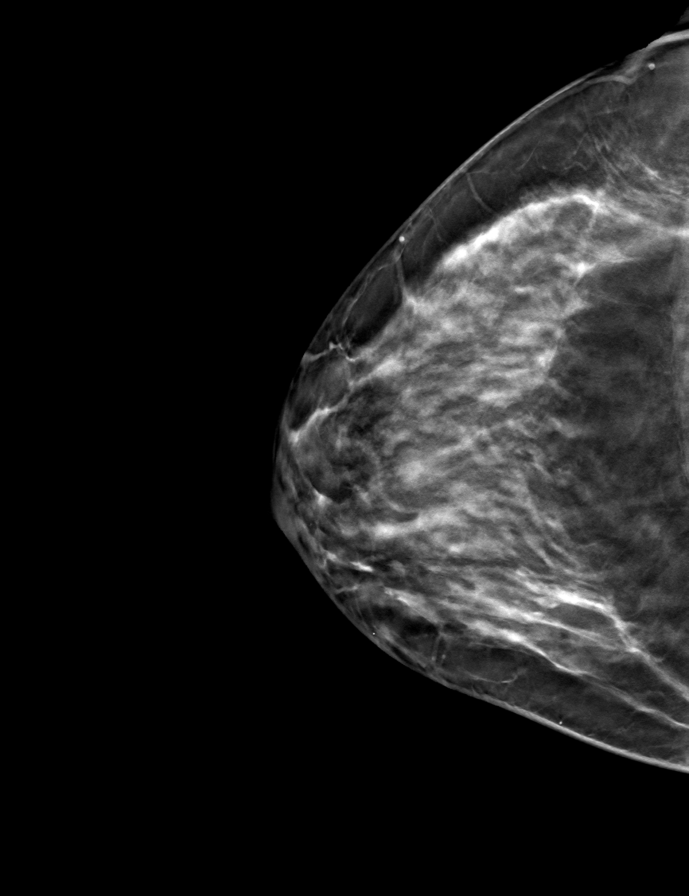

[L MLO tomo · tomo slice 33/65.0]
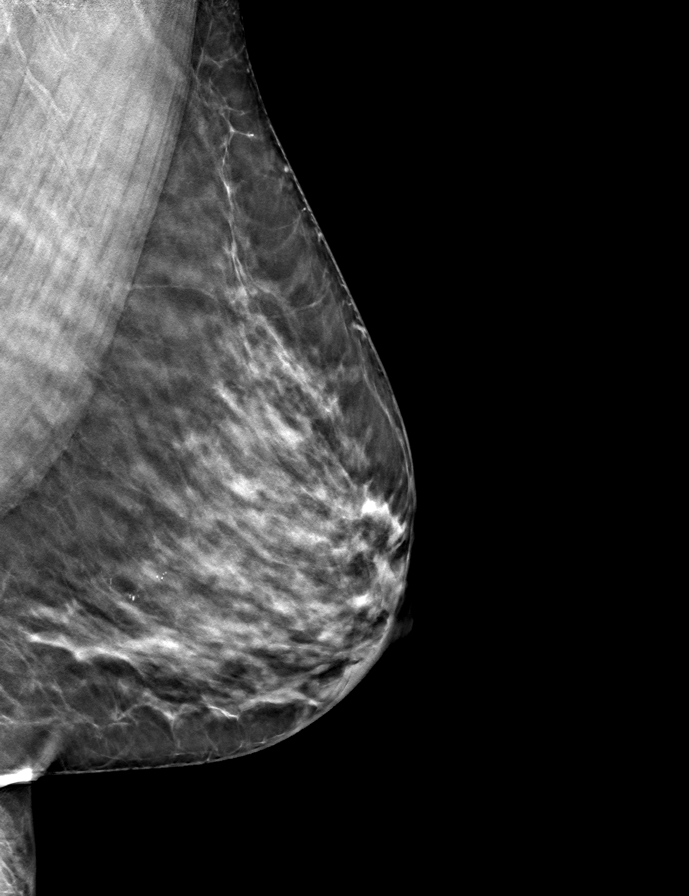

[R MLO tomo · tomo slice 35/70.0]
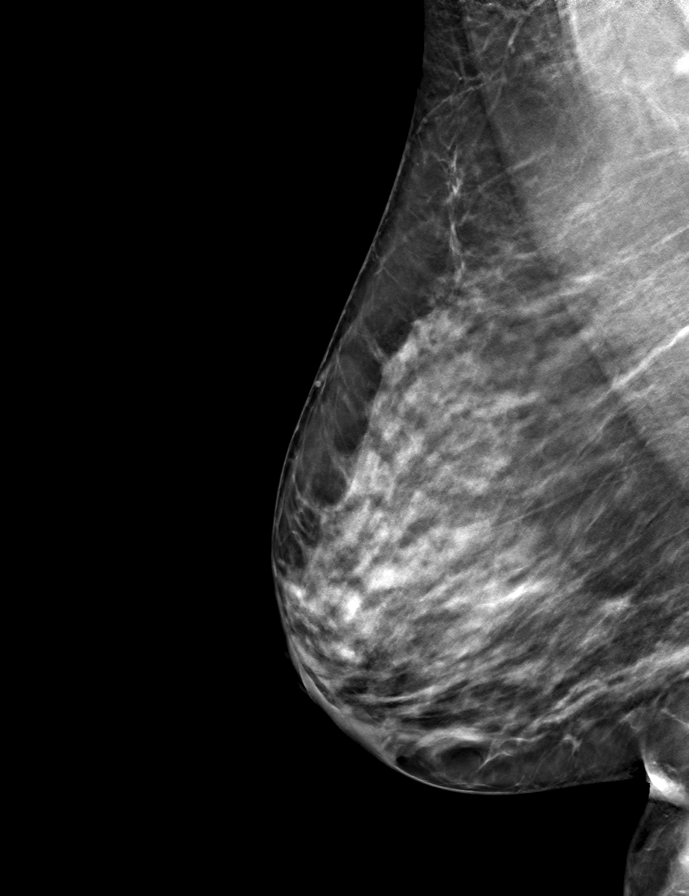

[9 of 24 positions shown; findings below may reference images not displayed]

ACR Breast Density Category c: The breast tissue is heterogeneously
dense, which may obscure small masses.
FINDINGS: There are no findings suspicious for malignancy. Images were
processed with CAD.
IMPRESSION: No mammographic evidence of malignancy. A result letter of this
screening mammogram will be mailed directly to the patient.

RECOMMENDATION:
Screening mammogram in one year. (Code:[id number])

BI-RADS CATEGORY  1: Negative.

## 2021-07-07 LAB — HM COLONOSCOPY

## 2021-07-30 ENCOUNTER — Telehealth: Payer: Self-pay | Admitting: Family Medicine

## 2021-07-30 ENCOUNTER — Other Ambulatory Visit: Payer: Self-pay

## 2021-07-30 DIAGNOSIS — I10 Essential (primary) hypertension: Secondary | ICD-10-CM

## 2021-07-30 MED ORDER — METOPROLOL SUCCINATE ER 25 MG PO TB24: ORAL_TABLET | ORAL | 0 refills | Status: DC

## 2021-07-30 NOTE — Telephone Encounter (Signed)
Medication sent to patient's pharmacy.

## 2021-07-30 NOTE — Telephone Encounter (Deleted)
..  Caller name:  Ruthmarie Asturias  Caller callback [phone]  Encourage patient to contact the pharmacy for refills or they can request refills through [place]:  (Please schedule appointment if longer than 1 year)  NA  NEXT APPOINTMENT DATE:  NA   MEDICATION NAME & DOSE:Metrropolol  Is the patient out of medication? {YES/NO:21197::"***":1}  PHARMACY: [place]  Let patient know to contact pharmacy at the end of the day to make sure medication is ready.  Please notify patient to allow 48-72 hours to process  (CLINICAL TO FILL OR ROUTE PER PROTOCOLS)

## 2021-07-30 NOTE — Telephone Encounter (Deleted)
.  Caller name:Taylin Probation officer callback #:    Encourage patient to contact the pharmacy for refills or they can request refills through [place]:  (Please schedule appointment if longer than 1 year)  NEXT APPOINTMENT DATE:   MEDICATION NAME & DOSE:Metropolol   Is the patient out of medication? {YES/NO:21197::"***":1}  PHARMACY: [facility] on the [address] of [address]  Let patient know to contact pharmacy at the end of the day to make sure medication is ready.  Please notify patient to allow 48-72 hours to process  (CLINICAL TO FILL OR ROUTE PER PROTOCOLS)

## 2021-07-30 NOTE — Telephone Encounter (Signed)
Patient Needs her Metropolol filled and sent to [facility] on [address]

## 2021-09-08 ENCOUNTER — Encounter: Payer: Self-pay | Admitting: Family Medicine

## 2021-09-08 ENCOUNTER — Ambulatory Visit: Payer: 59 | Admitting: Family Medicine

## 2021-09-08 ENCOUNTER — Other Ambulatory Visit: Payer: Self-pay

## 2021-09-08 VITALS — BP 130/72 | HR 82 | Temp 98.2°F | Resp 17 | Ht 67.0 in | Wt 182.6 lb

## 2021-09-08 DIAGNOSIS — E785 Hyperlipidemia, unspecified: Secondary | ICD-10-CM | POA: Diagnosis not present

## 2021-09-08 DIAGNOSIS — I10 Essential (primary) hypertension: Secondary | ICD-10-CM | POA: Diagnosis not present

## 2021-09-08 DIAGNOSIS — F411 Generalized anxiety disorder: Secondary | ICD-10-CM | POA: Diagnosis not present

## 2021-09-08 DIAGNOSIS — R002 Palpitations: Secondary | ICD-10-CM

## 2021-09-08 DIAGNOSIS — L249 Irritant contact dermatitis, unspecified cause: Secondary | ICD-10-CM

## 2021-09-08 LAB — CBC
HCT: 42.3 % (ref 36.0–46.0)
Hemoglobin: 13.9 g/dL (ref 12.0–15.0)
MCHC: 32.9 g/dL (ref 30.0–36.0)
MCV: 87.5 fl (ref 78.0–100.0)
Platelets: 265 10*3/uL (ref 150.0–400.0)
RBC: 4.84 Mil/uL (ref 3.87–5.11)
RDW: 13.5 % (ref 11.5–15.5)
WBC: 5.4 10*3/uL (ref 4.0–10.5)

## 2021-09-08 MED ORDER — METOPROLOL SUCCINATE ER 25 MG PO TB24: ORAL_TABLET | ORAL | 1 refills | Status: DC

## 2021-09-08 MED ORDER — FLUOXETINE HCL 20 MG PO TABS: ORAL_TABLET | ORAL | 1 refills | Status: DC

## 2021-09-08 MED ORDER — TRIAMCINOLONE ACETONIDE 0.5 % EX OINT: 1.0000 "application " | TOPICAL_OINTMENT | Freq: Two times a day (BID) | CUTANEOUS | 1 refills | Status: DC | PRN

## 2021-09-08 NOTE — Progress Notes (Signed)
Subjective:  Patient ID: [name], female    DOB: 28-Aug-1971  Age: 50 y.o. MRN: [id number]  CC:  Chief Complaint  Patient presents with   Establish Care    Pt here for establishing care today would like to look at having heart health assessed, pt also in need of refills previous Rx today    Palpitations    Pt notes having palpitations after she will take her metoprolol and fluoxetine at night, apx 30-40 min after taking medications     HPI KEILEN [name] presents for  New patient with transfer of care from previous primary provider Elyn Aquas, PA-C.  chronic med refills as well as concern as above  Palpitations Noted at night approximately 30 to 40 minutes after taking her medications, metoprolol, fluoxetine.  She does have a history of PVC, mitral valve regurgitation based on problem list.  Takes metoprolol.  On and off palpitations for some time. Saw EP/cardiology in past Dr. Caryl Comes. More frequent past 2 months - irregular or extra beat. Not faster. Some at night - when resting.  No associated CP or dyspnea. Caffeine: chocolate - 1 bar per day, mushroom decaf coffee.  Lab Results  Component Value Date   TSH 1.42 06/07/2020   Lab Results  Component Value Date   WBC 5.2 06/07/2020   HGB 14.2 06/07/2020   HCT 42.7 06/07/2020   MCV 86.9 06/07/2020   PLT 255.0 06/07/2020      Anxiety Treated with fluoxetine 20 mg -1/2 pill daily - she decreased alprazolam as needed previously.  Controlled substance database reviewed. Last filled 08/16/20. Only takes xanax if having a rough time. Taking metoprolol daily - seems to help.  Has special needs son requiring total care. Some stress with his care. Prior therapist Jonnie Finner - does not feel like needs to be seen now, last saw about 9 months ago.    Depression screen [facility] 2/9 06/07/2020 06/06/2019 04/05/2019 12/07/2018 01/21/2018  Decreased Interest 0 1 0 0 0  Down, Depressed, Hopeless 0 1 0 0 0  PHQ - 2 Score 0 2 0 0 0  Altered  sleeping - 0 0 0 0  Tired, decreased energy - 0 0 0 0  Change in appetite - 0 0 0 0  Feeling bad or failure about yourself  - 0 0 0 0  Trouble concentrating - 0 0 0 0  Moving slowly or fidgety/restless - 0 0 0 0  Suicidal thoughts - 0 0 0 0  PHQ-9 Score - 2 0 0 0  Difficult doing work/chores - Somewhat difficult Not difficult at all Not difficult at all Not difficult at all   GAD 7 : Generalized Anxiety Score 04/05/2019  Nervous, Anxious, on Edge 2  Control/stop worrying 1  Worry too much - different things 1  Trouble relaxing 2  Restless 1  Easily annoyed or irritable 0  Afraid - awful might happen 1  Total GAD 7 Score 8  Anxiety Difficulty Somewhat difficult     Hyperlipidemia: No FH of early cardiac disease.  Father, brother with TIA.  No current meds. The 10-year ASCVD risk score (Arnett DK, et al., 2019) is: 3.2%   Values used to calculate the score:     Age: 70 years     Sex: Female     Is Non-Hispanic African American: Yes     Diabetic: No     Tobacco smoker: No     Systolic Blood Pressure: [id number] mmHg  Is BP treated: Yes     HDL Cholesterol: 62.2 mg/dL     Total Cholesterol: 229 mg/dL  Lab Results  Component Value Date   CHOL 229 (H) 06/07/2020   HDL 62.20 06/07/2020   LDLCALC 151 (H) 06/07/2020   LDLDIRECT 161.0 06/05/2013   TRIG 79.0 06/07/2020   CHOLHDL 4 06/07/2020   Lab Results  Component Value Date   ALT 16 06/07/2020   AST 15 06/07/2020   ALKPHOS 76 06/07/2020   BILITOT 0.8 06/07/2020   RAsh Occasional on back - treats with kenalog every few days. Works well. Has seen dermatology. Overall better since hysterectomy.        History Patient Active Problem List   Diagnosis Date Noted   Uterine fibroid 10/03/2019   Status post total hysterectomy 10/03/2019   Breast cancer screening 06/06/2019   Visit for preventive health examination 01/21/2018   Skin cysts, generalized 01/21/2018   Vitamin D deficiency 12/23/2016   Vitamin B12  deficiency 12/23/2016   Fibrocystic breast disease 12/23/2016   Seasonal allergic rhinitis 06/05/2013   Mitral regurgitation 06/02/2011   Hyperlipidemia 11/14/2008   Hypertension 07/13/2008   GILBERT'S SYNDROME 06/22/2007   Anxiety state 06/22/2007   PVC (premature ventricular contraction) 06/22/2007   Past Medical History:  Diagnosis Date   Anxiety    Gilbert syndrome    Heart murmur    Dr. Jens Som history of heart murmur   Herpes exposure    Type 1 & 2 ; Dr Garwin Brothers, Gyn   Hyperlipidemia    Hypertension    Iron deficiency anemia    Pre-diabetes    Premature ventricular contractions    Skin rash    waist area comes and goes   Vitamin B 12 deficiency    Vitamin D deficiency    History of, medication stopped because Vitamin d became too hogh   Past Surgical History:  Procedure Laterality Date   ABDOMINAL HYSTERECTOMY  09/2019   CESAREAN SECTION     X 2, Dr Garwin Brothers   IR RADIOLOGIST EVAL & MGMT  08/29/2019   ROBOTIC ASSISTED LAPAROSCOPIC HYSTERECTOMY AND SALPINGECTOMY Bilateral 10/03/2019   Procedure: XI ROBOTIC ASSISTED LAPAROSCOPIC HYSTERECTOMY AND SALPINGECTOMY;  Surgeon: Servando Salina, MD;  Location: [facility];  Service: Gynecology;  Laterality: Bilateral;   TUBAL LIGATION     WISDOM TOOTH EXTRACTION     Allergies  Allergen Reactions   Cephalexin      03/2012 Dyspnea   Other     Grapefruit due to Fluoxetine   Penicillins     Unknown reaction as child   Prior to Admission medications   Medication Sig Start Date End Date Taking? Authorizing Provider  ALPRAZolam (XANAX) 0.25 MG tablet Take 1 tablet (0.25 mg total) by mouth 2 (two) times daily as needed for anxiety. 08/16/20  Yes Brunetta Jeans, PA-C  Cholecalciferol (VITAMIN D3) 10000 units TABS Take 1 tablet by mouth daily. 12/23/16  Yes Burns, Claudina Lick, MD  clobetasol ointment (TEMOVATE) [id number] % Apply 1 application topically 2 (two) times daily. 08/08/20  Yes Sheffield, Vida Roller R, PA-C   clotrimazole-betamethasone (LOTRISONE) cream Apply 1 application topically daily as needed (rash).   Yes [provider]  FLUoxetine (PROZAC) 20 MG tablet TAKE 1 TABLET(20 MG) BY MOUTH DAILY Patient taking differently: 20 mg. TAKE 1 half TABLET(20 MG) BY MOUTH DAILY 01/15/21  Yes Brunetta Jeans, PA-C  metoprolol succinate (TOPROL-XL) 25 MG 24 hr tablet TAKE 1 TABLET(25 MG) BY MOUTH DAILY. 07/30/21  Yes Wendie Agreste, MD  Multiple Vitamin (MULTIVITAMIN) tablet Take 1 tablet by mouth daily.   Yes [provider]  triamcinolone ointment (KENALOG) 0.5 % Apply 1 application topically 2 (two) times daily as needed (rash). 04/01/20  Yes Brunetta Jeans, PA-C   Social History   Socioeconomic History   Marital status: Married    Spouse name: Not on file   Number of children: Not on file   Years of education: Not on file   Highest education level: Not on file  Occupational History   Not on file  Tobacco Use   Smoking status: Never   Smokeless tobacco: Never  Vaping Use   Vaping Use: Never used  Substance and Sexual Activity   Alcohol use: No   Drug use: No   Sexual activity: Not on file  Other Topics Concern   Not on file  Social History Narrative   Not on file   Social Determinants of Health   Financial Resource Strain: Not on file  Food Insecurity: Not on file  Transportation Needs: Not on file  Physical Activity: Not on file  Stress: Not on file  Social Connections: Not on file  Intimate Partner Violence: Not on file    Review of Systems   Objective:   Vitals:   09/08/21 1026  BP: 130/72  Pulse: 82  Resp: 17  Temp: 98.2 F (36.8 C)  TempSrc: Temporal  SpO2: 97%  Weight: 182 lb 9.6 oz (82.8 kg)  Height: '5\' 7"'$  (1.702 m)      Physical Exam Vitals reviewed.  Constitutional:      Appearance: Normal appearance. She is well-developed.  HENT:     Head: Normocephalic and atraumatic.  Eyes:     Conjunctiva/sclera: Conjunctivae normal.      Pupils: Pupils are equal, round, and reactive to light.  Neck:     Vascular: No carotid bruit.  Cardiovascular:     Rate and Rhythm: Normal rate and regular rhythm.     Heart sounds: Normal heart sounds.  Pulmonary:     Effort: Pulmonary effort is normal.     Breath sounds: Normal breath sounds.  Abdominal:     Palpations: Abdomen is soft. There is no pulsatile mass.     Tenderness: There is no abdominal tenderness.  Musculoskeletal:     Right lower leg: No edema.     Left lower leg: No edema.  Skin:    General: Skin is warm and dry.  Neurological:     Mental Status: She is alert and oriented to person, place, and time.  Psychiatric:        Mood and Affect: Mood normal.        Behavior: Behavior normal.     EKG: Sinus rhythm, rate 60.  No apparent acute findings or ST/T wave changes.  Compared to 11/23/2018.  No significant changes.  Assessment & Plan:  SHAMIRA CHRISCO is a 50 y.o. female . Palpitations - Plan: EKG 12-Lead, Comprehensive metabolic panel, CBC, TSH  - reassuring EKG. Check CBC, TSH. Handout on causes. Trial of morning dosing of fluoxetine and higher dose option as anxiety possible contributor. Recheck 1 month, refer to cardiology if persistent - sooner follow up if worse.er precautions.   Irritant contact dermatitis, unspecified trigger - Plan: triamcinolone ointment (KENALOG) 0.5 %  - refilled for intermittent use, follow up if new or persistent rash.   Essential hypertension - Plan: metoprolol succinate (TOPROL-XL) 25 MG 24 hr tablet  -  stable, continue same dose.   Anxiety state - Plan: FLUoxetine (PROZAC) 20 MG tablet  - as above. Change to am dosing, option higher dose, option of counseling follow up.   Hyperlipidemia, unspecified hyperlipidemia type - Plan: Comprehensive metabolic panel, Lipid panel  - check labs, no new meds for now.   Meds ordered this encounter  Medications   triamcinolone ointment (KENALOG) 0.5 %    Sig: Apply 1 application  topically 2 (two) times daily as needed (rash).    Dispense:  15 g    Refill:  1   metoprolol succinate (TOPROL-XL) 25 MG 24 hr tablet    Sig: TAKE 1 TABLET(25 MG) BY MOUTH DAILY.    Dispense:  90 tablet    Refill:  1   FLUoxetine (PROZAC) 20 MG tablet    Sig: TAKE 1 TABLET(20 MG) BY MOUTH DAILY    Dispense:  90 tablet    Refill:  1   Patient Instructions  It may be worth trying to increase the fluoxetine to full pill if changing to morning dose of fluoxetine. I will check other labs for palpitations today. At next visit if not improving, I can refer you back to cardiology. Return to the clinic or go to the nearest emergency room if any of your symptoms worsen or new symptoms occur.  No other med changes today.  Let me know how you are doing over the next few weeks, but I would like to follow-up with you in 1 month to see how things are going and we can review your labs at that time further if needed.  Thanks for coming in today and let me know if there are questions.  Managing Anxiety, Adult After being diagnosed with an anxiety disorder, you may be relieved to know why you have felt or behaved a certain way. You may also feel overwhelmed about the treatment ahead and what it will mean for your life. With care and support, you can manage this condition and recover from it. How to manage lifestyle changes Managing stress and anxiety Stress is your body's reaction to life changes and events, both good and bad. Most stress will last just a few hours, but stress can be ongoing and can lead to more than just stress. Although stress can play a major role in anxiety, it is not the same as anxiety. Stress is usually caused by something external, such as a deadline, test, or competition. Stress normally passes after the triggering event has ended.  Anxiety is caused by something internal, such as imagining a terrible outcome or worrying that something will go wrong that will devastate you. Anxiety  often does not go away even after the triggering event is over, and it can become long-term (chronic) worry. It is important to understand the differences between stress and anxiety and to manage your stress effectively so that it does not lead to an anxious response. Talk with your health care provider or a counselor to learn more about reducing anxiety and stress. He or she may suggest tension reduction techniques, such as: Music therapy. This can include creating or listening to music that you enjoy and that inspires you. Mindfulness-based meditation. This involves being aware of your normal breaths while not trying to control your breathing. It can be done while sitting or walking. Centering prayer. This involves focusing on a word, phrase, or sacred image that means something to you and brings you peace. Deep breathing. To do this, expand your stomach and  inhale slowly through your nose. Hold your breath for 3-5 seconds. Then exhale slowly, letting your stomach muscles relax. Self-talk. This involves identifying thought patterns that lead to anxiety reactions and changing those patterns. Muscle relaxation. This involves tensing muscles and then relaxing them. Choose a tension reduction technique that suits your lifestyle and personality. These techniques take time and practice. Set aside 5-15 minutes a day to do them. Therapists can offer counseling and training in these techniques. The training to help with anxiety may be covered by some insurance plans. Other things you can do to manage stress and anxiety include: Keeping a stress/anxiety diary. This can help you learn what triggers your reaction and then learn ways to manage your response. Thinking about how you react to certain situations. You may not be able to control everything, but you can control your response. Making time for activities that help you relax and not feeling guilty about spending your time in this way. Visual imagery and yoga  can help you stay calm and relax.  Medicines Medicines can help ease symptoms. Medicines for anxiety include: Anti-anxiety drugs. Antidepressants. Medicines are often used as a primary treatment for anxiety disorder. Medicines will be prescribed by a health care provider. When used together, medicines, psychotherapy, and tension reduction techniques may be the most effective treatment. Relationships Relationships can play a big part in helping you recover. Try to spend more time connecting with trusted friends and family members. Consider going to couples counseling, taking family education classes, or going to family therapy. Therapy can help you and others better understand your condition. How to recognize changes in your anxiety Everyone responds differently to treatment for anxiety. Recovery from anxiety happens when symptoms decrease and stop interfering with your daily activities at home or work. This may mean that you will start to: Have better concentration and focus. Worry will interfere less in your daily thinking. Sleep better. Be less irritable. Have more energy. Have improved memory. It is important to recognize when your condition is getting worse. Contact your health care provider if your symptoms interfere with home or work and you feel like your condition is not improving. Follow these instructions at home: Activity Exercise. Most adults should do the following: Exercise for at least 150 minutes each week. The exercise should increase your heart rate and make you sweat (moderate-intensity exercise). Strengthening exercises at least twice a week. Get the right amount and quality of sleep. Most adults need 7-9 hours of sleep each night. Lifestyle  Eat a healthy diet that includes plenty of vegetables, fruits, whole grains, low-fat dairy products, and lean protein. Do not eat a lot of foods that are high in solid fats, added sugars, or salt. Make choices that simplify your  life. Do not use any products that contain nicotine or tobacco, such as cigarettes, e-cigarettes, and chewing tobacco. If you need help quitting, ask your health care provider. Avoid caffeine, alcohol, and certain over-the-counter cold medicines. These may make you feel worse. Ask your pharmacist which medicines to avoid. General instructions Take over-the-counter and prescription medicines only as told by your health care provider. Keep all follow-up visits as told by your health care provider. This is important. Where to find support You can get help and support from these sources: Self-help groups. Online and [employer]. A trusted spiritual leader. Couples counseling. Family education classes. Family therapy. Where to find more information You may find that joining a support group helps you deal with your anxiety. The following  sources can help you locate counselors or support groups near you: [place]: www.mentalhealth[place].net Anxiety and Depression Association of [place] (ADAA): https://www.clark.net/ National Alliance on Mental Illness (NAMI): www.nami.org Contact a health care provider if you: Have a hard time staying focused or finishing daily tasks. Spend many hours a day feeling worried about everyday life. Become exhausted by worry. Start to have headaches, feel tense, or have nausea. Urinate more than normal. Have diarrhea. Get help right away if you have: A racing heart and shortness of breath. Thoughts of hurting yourself or others. If you ever feel like you may hurt yourself or others, or have thoughts about taking your own life, get help right away. You can go to your nearest emergency department or call: Your local emergency services (911 in the [place] A suicide crisis helpline, such as the [place] at (769) 878-4213. This is open 24 hours a day. Summary Taking steps to learn and use tension reduction techniques can help  calm you and help prevent triggering an anxiety reaction. When used together, medicines, psychotherapy, and tension reduction techniques may be the most effective treatment. Family, friends, and partners can play a big part in helping you recover from an anxiety disorder. This information is not intended to replace advice given to you by your health care provider. Make sure you discuss any questions you have with your health care provider. Document Revised: 05/09/2019 Document Reviewed: 05/09/2019 Elsevier Patient Education  [place].   Palpitations Palpitations are feelings that your heartbeat is irregular or is faster than normal. It may feel like your heart is fluttering or skipping a beat. Palpitations are usually not a serious problem. They may be caused by many things, including smoking, caffeine, alcohol, stress, and certain medicines or drugs. Most causes of palpitations are not serious. However, some palpitations can be a sign of a serious problem. You may need further tests to rule out serious medical problems. Follow these instructions at home:   Pay attention to any changes in your condition. Take these actions to help manage your symptoms: Eating and drinking Avoid foods and drinks that may cause palpitations. These may include: Caffeinated coffee, tea, soft drinks, diet pills, and energy drinks. Chocolate. Alcohol. Lifestyle Take steps to reduce your stress and anxiety. Things that can help you relax include: Yoga. Mind-body activities, such as deep breathing, meditation, or using words and images to create positive thoughts (guided imagery). Physical activity, such as swimming, jogging, or walking. Tell your health care provider if your palpitations increase with activity. If you have chest pain or shortness of breath with activity, do not continue the activity until you are seen by your health care provider. Biofeedback. This is a method that helps you learn to use  your mind to control things in your body, such as your heartbeat. Do not use drugs, including cocaine or ecstasy. Do not use marijuana. Get plenty of rest and sleep. Keep a regular bed time. General instructions Take over-the-counter and prescription medicines only as told by your health care provider. Do not use any products that contain nicotine or tobacco, such as cigarettes and e-cigarettes. If you need help quitting, ask your health care provider. Keep all follow-up visits as told by your health care provider. This is important. These may include visits for further testing if palpitations do not go away or get worse. Contact a health care provider if you: Continue to have a fast or irregular heartbeat after 24 hours. Notice that your  palpitations occur more often. Get help right away if you: Have chest pain or shortness of breath. Have a severe headache. Feel dizzy or you faint. Summary Palpitations are feelings that your heartbeat is irregular or is faster than normal. It may feel like your heart is fluttering or skipping a beat. Palpitations may be caused by many things, including smoking, caffeine, alcohol, stress, certain medicines, and drugs. Although most causes of palpitations are not serious, some causes can be a sign of a serious medical problem. Get help right away if you faint or have chest pain, shortness of breath, a severe headache, or dizziness. This information is not intended to replace advice given to you by your health care provider. Make sure you discuss any questions you have with your health care provider. Document Revised: 01/12/2018 Document Reviewed: 01/19/2018 Elsevier Patient Education  2022 [place],   Merri Ray, MD [facility] 09/08/21 10:44 AM

## 2021-09-08 NOTE — Patient Instructions (Addendum)
It may be worth trying to increase the fluoxetine to full pill if changing to morning dose of fluoxetine. I will check other labs for palpitations today. At next visit if not improving, I can refer you back to cardiology. Return to the clinic or go to the nearest emergency room if any of your symptoms worsen or new symptoms occur.  No other med changes today.  Let me know how you are doing over the next few weeks, but I would like to follow-up with you in 1 month to see how things are going and we can review your labs at that time further if needed.  Thanks for coming in today and let me know if there are questions.  Managing Anxiety, Adult After being diagnosed with an anxiety disorder, you may be relieved to know why you have felt or behaved a certain way. You may also feel overwhelmed about the treatment ahead and what it will mean for your life. With care and support, you can manage this condition and recover from it. How to manage lifestyle changes Managing stress and anxiety Stress is your body's reaction to life changes and events, both good and bad. Most stress will last just a few hours, but stress can be ongoing and can lead to more than just stress. Although stress can play a major role in anxiety, it is not the same as anxiety. Stress is usually caused by something external, such as a deadline, test, or competition. Stress normally passes after the triggering event has ended.  Anxiety is caused by something internal, such as imagining a terrible outcome or worrying that something will go wrong that will devastate you. Anxiety often does not go away even after the triggering event is over, and it can become long-term (chronic) worry. It is important to understand the differences between stress and anxiety and to manage your stress effectively so that it does not lead to an anxious response. Talk with your health care provider or a counselor to learn more about reducing anxiety and stress. He or  she may suggest tension reduction techniques, such as: Music therapy. This can include creating or listening to music that you enjoy and that inspires you. Mindfulness-based meditation. This involves being aware of your normal breaths while not trying to control your breathing. It can be done while sitting or walking. Centering prayer. This involves focusing on a word, phrase, or sacred image that means something to you and brings you peace. Deep breathing. To do this, expand your stomach and inhale slowly through your nose. Hold your breath for 3-5 seconds. Then exhale slowly, letting your stomach muscles relax. Self-talk. This involves identifying thought patterns that lead to anxiety reactions and changing those patterns. Muscle relaxation. This involves tensing muscles and then relaxing them. Choose a tension reduction technique that suits your lifestyle and personality. These techniques take time and practice. Set aside 5-15 minutes a day to do them. Therapists can offer counseling and training in these techniques. The training to help with anxiety may be covered by some insurance plans. Other things you can do to manage stress and anxiety include: Keeping a stress/anxiety diary. This can help you learn what triggers your reaction and then learn ways to manage your response. Thinking about how you react to certain situations. You may not be able to control everything, but you can control your response. Making time for activities that help you relax and not feeling guilty about spending your time in this way. Visual imagery  and yoga can help you stay calm and relax.  Medicines Medicines can help ease symptoms. Medicines for anxiety include: Anti-anxiety drugs. Antidepressants. Medicines are often used as a primary treatment for anxiety disorder. Medicines will be prescribed by a health care provider. When used together, medicines, psychotherapy, and tension reduction techniques may be the most  effective treatment. Relationships Relationships can play a big part in helping you recover. Try to spend more time connecting with trusted friends and family members. Consider going to couples counseling, taking family education classes, or going to family therapy. Therapy can help you and others better understand your condition. How to recognize changes in your anxiety Everyone responds differently to treatment for anxiety. Recovery from anxiety happens when symptoms decrease and stop interfering with your daily activities at home or work. This may mean that you will start to: Have better concentration and focus. Worry will interfere less in your daily thinking. Sleep better. Be less irritable. Have more energy. Have improved memory. It is important to recognize when your condition is getting worse. Contact your health care provider if your symptoms interfere with home or work and you feel like your condition is not improving. Follow these instructions at home: Activity Exercise. Most adults should do the following: Exercise for at least 150 minutes each week. The exercise should increase your heart rate and make you sweat (moderate-intensity exercise). Strengthening exercises at least twice a week. Get the right amount and quality of sleep. Most adults need 7-9 hours of sleep each night. Lifestyle  Eat a healthy diet that includes plenty of vegetables, fruits, whole grains, low-fat dairy products, and lean protein. Do not eat a lot of foods that are high in solid fats, added sugars, or salt. Make choices that simplify your life. Do not use any products that contain nicotine or tobacco, such as cigarettes, e-cigarettes, and chewing tobacco. If you need help quitting, ask your health care provider. Avoid caffeine, alcohol, and certain over-the-counter cold medicines. These may make you feel worse. Ask your pharmacist which medicines to avoid. General instructions Take over-the-counter and  prescription medicines only as told by your health care provider. Keep all follow-up visits as told by your health care provider. This is important. Where to find support You can get help and support from these sources: Self-help groups. Online and [employer]. A trusted spiritual leader. Couples counseling. Family education classes. Family therapy. Where to find more information You may find that joining a support group helps you deal with your anxiety. The following sources can help you locate counselors or support groups near you: [place]: www.mentalhealthamerica.net Anxiety and Depression Association of [place] (ADAA): https://www.clark.net/ National Alliance on Mental Illness (NAMI): www.nami.org Contact a health care provider if you: Have a hard time staying focused or finishing daily tasks. Spend many hours a day feeling worried about everyday life. Become exhausted by worry. Start to have headaches, feel tense, or have nausea. Urinate more than normal. Have diarrhea. Get help right away if you have: A racing heart and shortness of breath. Thoughts of hurting yourself or others. If you ever feel like you may hurt yourself or others, or have thoughts about taking your own life, get help right away. You can go to your nearest emergency department or call: Your local emergency services (911 in the [place] A suicide crisis helpline, such as the [place] at 669-843-5212. This is open 24 hours a day. Summary Taking steps to learn and use tension reduction techniques  can help calm you and help prevent triggering an anxiety reaction. When used together, medicines, psychotherapy, and tension reduction techniques may be the most effective treatment. Family, friends, and partners can play a big part in helping you recover from an anxiety disorder. This information is not intended to replace advice given to you by your health care provider. Make  sure you discuss any questions you have with your health care provider. Document Revised: 05/09/2019 Document Reviewed: 05/09/2019 Elsevier Patient Education  [employer].   Palpitations Palpitations are feelings that your heartbeat is irregular or is faster than normal. It may feel like your heart is fluttering or skipping a beat. Palpitations are usually not a serious problem. They may be caused by many things, including smoking, caffeine, alcohol, stress, and certain medicines or drugs. Most causes of palpitations are not serious. However, some palpitations can be a sign of a serious problem. You may need further tests to rule out serious medical problems. Follow these instructions at home:   Pay attention to any changes in your condition. Take these actions to help manage your symptoms: Eating and drinking Avoid foods and drinks that may cause palpitations. These may include: Caffeinated coffee, tea, soft drinks, diet pills, and energy drinks. Chocolate. Alcohol. Lifestyle Take steps to reduce your stress and anxiety. Things that can help you relax include: Yoga. Mind-body activities, such as deep breathing, meditation, or using words and images to create positive thoughts (guided imagery). Physical activity, such as swimming, jogging, or walking. Tell your health care provider if your palpitations increase with activity. If you have chest pain or shortness of breath with activity, do not continue the activity until you are seen by your health care provider. Biofeedback. This is a method that helps you learn to use your mind to control things in your body, such as your heartbeat. Do not use drugs, including cocaine or ecstasy. Do not use marijuana. Get plenty of rest and sleep. Keep a regular bed time. General instructions Take over-the-counter and prescription medicines only as told by your health care provider. Do not use any products that contain nicotine or tobacco, such as  cigarettes and e-cigarettes. If you need help quitting, ask your health care provider. Keep all follow-up visits as told by your health care provider. This is important. These may include visits for further testing if palpitations do not go away or get worse. Contact a health care provider if you: Continue to have a fast or irregular heartbeat after 24 hours. Notice that your palpitations occur more often. Get help right away if you: Have chest pain or shortness of breath. Have a severe headache. Feel dizzy or you faint. Summary Palpitations are feelings that your heartbeat is irregular or is faster than normal. It may feel like your heart is fluttering or skipping a beat. Palpitations may be caused by many things, including smoking, caffeine, alcohol, stress, certain medicines, and drugs. Although most causes of palpitations are not serious, some causes can be a sign of a serious medical problem. Get help right away if you faint or have chest pain, shortness of breath, a severe headache, or dizziness. This information is not intended to replace advice given to you by your health care provider. Make sure you discuss any questions you have with your health care provider. Document Revised: 01/12/2018 Document Reviewed: 01/19/2018 Elsevier Patient Education  2022 [employer].

## 2021-09-09 LAB — LIPID PANEL
Cholesterol: 234 mg/dL — ABNORMAL HIGH (ref 0–200)
HDL: 64.9 mg/dL (ref >39.00)
LDL Cholesterol: 156 mg/dL — ABNORMAL HIGH (ref 0–99)
NonHDL: 169.59
Total CHOL/HDL Ratio: 4
Triglycerides: 67 mg/dL (ref 0.0–149.0)
VLDL: 13.4 mg/dL (ref 0.0–40.0)

## 2021-09-09 LAB — COMPREHENSIVE METABOLIC PANEL
ALT: 17 U/L (ref 0–35)
AST: 18 U/L (ref 0–37)
Albumin: 4.3 g/dL (ref 3.5–5.2)
Alkaline Phosphatase: 72 U/L (ref 39–117)
BUN: 8 mg/dL (ref 6–23)
CO2: 24 mEq/L (ref 19–32)
Calcium: 8.8 mg/dL (ref 8.4–10.5)
Chloride: 104 mEq/L (ref 96–112)
Creatinine, Ser: 0.86 mg/dL (ref 0.40–1.20)
GFR: 79.11 mL/min (ref >60.00)
Glucose, Bld: 77 mg/dL (ref 70–99)
Potassium: 4.3 mEq/L (ref 3.5–5.1)
Sodium: 138 mEq/L (ref 135–145)
Total Bilirubin: 0.8 mg/dL (ref 0.2–1.2)
Total Protein: 6.9 g/dL (ref 6.0–8.3)

## 2021-09-09 LAB — TSH: TSH: 1.74 u[IU]/mL (ref 0.35–5.50)

## 2021-11-24 ENCOUNTER — Other Ambulatory Visit: Payer: Self-pay | Admitting: Physician Assistant

## 2021-11-24 DIAGNOSIS — L910 Hypertrophic scar: Secondary | ICD-10-CM

## 2021-11-26 ENCOUNTER — Encounter: Payer: Self-pay | Admitting: Medical

## 2021-11-26 ENCOUNTER — Telehealth (INDEPENDENT_AMBULATORY_CARE_PROVIDER_SITE_OTHER): Payer: 59 | Admitting: Medical

## 2021-11-26 VITALS — BP 141/87 | Temp 99.0°F

## 2021-11-26 DIAGNOSIS — U071 COVID-19: Secondary | ICD-10-CM

## 2021-11-26 MED ORDER — BENZONATATE 100 MG PO CAPS: 100.0000 mg | ORAL_CAPSULE | Freq: Three times a day (TID) | ORAL | 0 refills | Status: DC | PRN

## 2021-11-26 MED ORDER — MOLNUPIRAVIR EUA 200MG CAPSULE: 4.0000 | ORAL_CAPSULE | Freq: Two times a day (BID) | ORAL | 0 refills | Status: AC

## 2021-11-26 MED ORDER — FLUTICASONE PROPIONATE 50 MCG/ACT NA SUSP: 2.0000 | Freq: Every day | NASAL | 1 refills | Status: DC

## 2021-11-26 NOTE — Progress Notes (Signed)
Subjective:    Patient ID: [name], female    DOB: Aug 23, 1971, 50 y.o.   MRN: [id number]  HPI  Virtual Visit via Video Note  I connected with [name] on 11/26/21 at  3:40 PM EST by a video enabled telemedicine application and verified that I am speaking with the correct person using two identifiers.  Location: Patient: home Provider: home   I discussed the limitations of evaluation and management by telemedicine and the availability of in person appointments. The patient expressed understanding and agreed to proceed.  History of Present Illness:   Pt child and husband had covid test + earlier in week. Pt just got her result today.  Pt is having some anxiety to covid. Lost sister to covid she was 61 yo (before vaccine was out). Brother was on ventilator 1st year of pandemic before vaccine.   Pt has not been vaccinated. She state has talked to prior providers before in the past.  Pt is feeling anxious. She has xanax and prozac.  Pt body aches just came on today. No sob, no wheezing. Has mild nasal congestion and rare cough.   She had 02 sat 97-98%.  Pt is on vit D to make sure levels are high. She is on multivitamin with zinc.   Observations/Objective:  General-no acute distress, pleasant, oriented. Lungs- on inspection lungs appear unlabored. Neck- no tracheal deviation or jvd on inspection. Neuro- gross motor function appears intact.   Assessment and Plan: Patient Instructions  Covid infection. Early in course and mild illness presently. However pt is unvaccinated.   Rx molnupiravir. Explained EAU designation. Discussed paxlovid as well. Pt expressed more concern about paxlovid. Decided after discussion to rx molnupivavir. Explained best to start antiviral within 5 days of symptoms onset. With her unvaccinated history recommend start today.  Rx benzonatate for cough and flonase for nasal congestion.  Check 02 sat daily and if less than 96% let me  know.  If severe signs/symptoms changes as discussed then be seen in the ED.  Htn- mild elevated. I think related to stress from illness. If bp increasing further let me know.  Anxiety- continue prozac and xanax.    Mackie Pai, PA-C    Time spent with patient today was  34 minutes which consisted of chart revdiew, discussing diagnosis, work up treatment and documentation.   Follow Up Instructions:    I discussed the assessment and treatment plan with the patient. The patient was provided an opportunity to ask questions and all were answered. The patient agreed with the plan and demonstrated an understanding of the instructions.   The patient was advised to call back or seek an in-person evaluation if the symptoms worsen or if the condition fails to improve as anticipated.     Mackie Pai, PA-C   Review of Systems  Constitutional:  Negative for chills, fatigue and fever.  HENT:  Negative for congestion, rhinorrhea, sinus pressure and sinus pain.   Respiratory:  Positive for cough. Negative for chest tightness, shortness of breath, wheezing and stridor.        Mild rare cough.  Cardiovascular:  Negative for chest pain and palpitations.  Gastrointestinal:  Negative for abdominal pain and diarrhea.  Musculoskeletal:  Positive for myalgias. Negative for back pain, joint swelling, neck pain and neck stiffness.  Skin:  Negative for rash.  Neurological:  Negative for dizziness, speech difficulty, weakness, numbness and headaches.  Hematological:  Negative for adenopathy. Does not bruise/bleed easily.  Psychiatric/Behavioral:  Negative for behavioral problems and confusion.     Past Medical History:  Diagnosis Date   Anxiety    Gilbert syndrome    Heart murmur    Dr. Jens Som history of heart murmur   Herpes exposure    Type 1 & 2 ; Dr Garwin Brothers, Gyn   Hyperlipidemia    Hypertension    Iron deficiency anemia    Pre-diabetes    Premature ventricular contractions    Skin  rash    waist area comes and goes   Vitamin B 12 deficiency    Vitamin D deficiency    History of, medication stopped because Vitamin d became too hogh     Social History   Socioeconomic History   Marital status: Married    Spouse name: Not on file   Number of children: Not on file   Years of education: Not on file   Highest education level: Not on file  Occupational History   Not on file  Tobacco Use   Smoking status: Never   Smokeless tobacco: Never  Vaping Use   Vaping Use: Never used  Substance and Sexual Activity   Alcohol use: No   Drug use: No   Sexual activity: Not on file  Other Topics Concern   Not on file  Social History Narrative   Not on file   Social Determinants of Health   Financial Resource Strain: Not on file  Food Insecurity: Not on file  Transportation Needs: Not on file  Physical Activity: Not on file  Stress: Not on file  Social Connections: Not on file  Intimate Partner Violence: Not on file    Past Surgical History:  Procedure Laterality Date   ABDOMINAL HYSTERECTOMY  09/2019   CESAREAN SECTION     X 2, Dr Garwin Brothers   IR RADIOLOGIST EVAL & MGMT  08/29/2019   ROBOTIC ASSISTED LAPAROSCOPIC HYSTERECTOMY AND SALPINGECTOMY Bilateral 10/03/2019   Procedure: XI ROBOTIC ASSISTED LAPAROSCOPIC HYSTERECTOMY AND SALPINGECTOMY;  Surgeon: Servando Salina, MD;  Location: [facility];  Service: Gynecology;  Laterality: Bilateral;   TUBAL LIGATION     WISDOM TOOTH EXTRACTION      Family History  Problem Relation Age of Onset   Asthma Mother    Stroke Father 25   Colon cancer Father    Diabetes Sister    Pneumonia Sister    Hypertension Brother    Transient ischemic attack Brother 76   Asthma Maternal Grandmother        died @ 65 of status asthmaticus   Heart disease Neg Hx     Allergies  Allergen Reactions   Cephalexin      03/2012 Dyspnea   Other     Grapefruit due to Fluoxetine   Penicillins     Unknown reaction as  child    Current Outpatient Medications on File Prior to Visit  Medication Sig Dispense Refill   ALPRAZolam (XANAX) 0.25 MG tablet Take 1 tablet (0.25 mg total) by mouth 2 (two) times daily as needed for anxiety. 20 tablet 0   Cholecalciferol (VITAMIN D3) 10000 units TABS Take 1 tablet by mouth daily.     clobetasol ointment (TEMOVATE) 0.05 % APPLY TOPICALLY TO THE AFFECTED AREA TWICE DAILY 60 g 3   clotrimazole-betamethasone (LOTRISONE) cream Apply 1 application topically daily as needed (rash).     FLUoxetine (PROZAC) 20 MG tablet TAKE 1 TABLET(20 MG) BY MOUTH DAILY 90 tablet 1   metoprolol succinate (TOPROL-XL) 25 MG 24 hr  tablet TAKE 1 TABLET(25 MG) BY MOUTH DAILY. 90 tablet 1   Multiple Vitamin (MULTIVITAMIN) tablet Take 1 tablet by mouth daily.     triamcinolone ointment (KENALOG) 0.5 % Apply 1 application topically 2 (two) times daily as needed (rash). 15 g 1   No current facility-administered medications on file prior to visit.    BP (!) 141/87   Temp 99 F (37.2 C)       Objective:   Physical Exam        Assessment & Plan:

## 2021-11-26 NOTE — Patient Instructions (Signed)
Covid infection. Early in course and mild illness presently. However pt is unvaccinated.   Rx molnupiravir. Explained EAU designation. Discussed paxlovid as well. Pt expressed more concern about paxlovid. Decided after discussion to rx molnupivavir. Explained best to start antiviral within 5 days of symptoms onset. With her unvaccinated history recommend start today.  Rx benzonatate for cough and flonase for nasal congestion.  Check 02 sat daily and if less than 96% let me know.  If severe signs/symptoms changes as discussed then be seen in the ED.  Htn- mild elevated. I think related to stress from illness. If bp increasing further let me know.  Anxiety- continue prozac and xanax.

## 2022-02-24 ENCOUNTER — Telehealth (INDEPENDENT_AMBULATORY_CARE_PROVIDER_SITE_OTHER): Payer: 59 | Admitting: Family Medicine

## 2022-02-24 ENCOUNTER — Encounter: Payer: Self-pay | Admitting: Family Medicine

## 2022-02-24 DIAGNOSIS — R051 Acute cough: Secondary | ICD-10-CM

## 2022-02-24 MED ORDER — AZITHROMYCIN 250 MG PO TABS: ORAL_TABLET | ORAL | 0 refills | Status: DC

## 2022-02-24 MED ORDER — BENZONATATE 100 MG PO CAPS: 100.0000 mg | ORAL_CAPSULE | Freq: Three times a day (TID) | ORAL | 0 refills | Status: DC | PRN

## 2022-02-24 NOTE — Progress Notes (Signed)
? ?Virtual Visit via Video  ? ?I connected with patient on 02/24/22 at  7:30 AM EST by a video enabled telemedicine application and verified that I am speaking with the correct person using two identifiers. ? ?Location patient: Home ?Location provider: [facility], Office ?Persons participating in the virtual visit: Patient, Provider, [employer] C) ? ?I discussed the limitations of evaluation and management by telemedicine and the availability of in person appointments. The patient expressed understanding and agreed to proceed. ? ?Subjective:  ? ?HPI:  ? ?Cough- pt reports special needs son 'had a bad cough' and she is concerned she 'caught something from him'.  Now having congestion, 'dry deep cough'.  Sxs started ~3 days ago.  No fevers.  Had [place] in December but states this doesn't feel similar.  Is not currently taking allergy medications.  Has HTN and is fearful to take OTC meds.  Had some leg pain but this resolved.  Denies facial pain/pressure.  Pt reports she has had similar issues in previous years when things bloomed but never with this kind of cough. ? ?ROS:  ? ?See pertinent positives and negatives per HPI. ? ?Patient Active Problem List  ? Diagnosis Date Noted  ? Uterine fibroid 10/03/2019  ? Status post total hysterectomy 10/03/2019  ? Breast cancer screening 06/06/2019  ? Visit for preventive health examination 01/21/2018  ? Skin cysts, generalized 01/21/2018  ? Vitamin D deficiency 12/23/2016  ? Vitamin B12 deficiency 12/23/2016  ? Fibrocystic breast disease 12/23/2016  ? Seasonal allergic rhinitis 06/05/2013  ? Mitral regurgitation 06/02/2011  ? Hyperlipidemia 11/14/2008  ? Hypertension 07/13/2008  ? GILBERT'S SYNDROME 06/22/2007  ? Anxiety state 06/22/2007  ? PVC (premature ventricular contraction) 06/22/2007  ?  ?Social History  ? ?Tobacco Use  ? Smoking status: Never  ? Smokeless tobacco: Never  ?Substance Use Topics  ? Alcohol use: No  ? ? ?Current Outpatient Medications:  ?   ALPRAZolam (XANAX) 0.25 MG tablet, Take 1 tablet (0.25 mg total) by mouth 2 (two) times daily as needed for anxiety., Disp: 20 tablet, Rfl: 0 ?  Cholecalciferol (VITAMIN D3) 10000 units TABS, Take 1 tablet by mouth daily., Disp: , Rfl:  ?  clobetasol ointment (TEMOVATE) 0.05 %, APPLY TOPICALLY TO THE AFFECTED AREA TWICE DAILY, Disp: 60 g, Rfl: 3 ?  clotrimazole-betamethasone (LOTRISONE) cream, Apply 1 application topically daily as needed (rash)., Disp: , Rfl:  ?  FLUoxetine (PROZAC) 20 MG tablet, TAKE 1 TABLET(20 MG) BY MOUTH DAILY, Disp: 90 tablet, Rfl: 1 ?  fluticasone (FLONASE) 50 MCG/ACT nasal spray, Place 2 sprays into both nostrils daily., Disp: 16 g, Rfl: 1 ?  metoprolol succinate (TOPROL-XL) 25 MG 24 hr tablet, TAKE 1 TABLET(25 MG) BY MOUTH DAILY., Disp: 90 tablet, Rfl: 1 ?  Multiple Vitamin (MULTIVITAMIN) tablet, Take 1 tablet by mouth daily., Disp: , Rfl:  ?  triamcinolone ointment (KENALOG) 0.5 %, Apply 1 application topically 2 (two) times daily as needed (rash)., Disp: 15 g, Rfl: 1 ?  benzonatate (TESSALON) 100 MG capsule, Take 1 capsule (100 mg total) by mouth 3 (three) times daily as needed for cough. (Patient not taking: Reported on 02/24/2022), Disp: 30 capsule, Rfl: 0 ? ?Allergies  ?Allergen Reactions  ? Cephalexin   ?   03/2012 Dyspnea  ? Other   ?  Grapefruit due to Fluoxetine  ? Penicillins   ?  Unknown reaction as child  ? ? ?Objective:  ? ?There were no vitals taken for this visit. ?AAOx3, NAD ?NCAT,  EOMI ?No obvious CN deficits ?Coloring WNL ?Pt is able to speak clearly, coherently without shortness of breath or increased work of breathing.  ?+ hacking cough ?Thought process is linear.  Mood is appropriate.  ? ?Assessment and Plan:  ? ?Cough- new.  Pt reports son had similar severe cough and is starting to improve as she is worsening.  States the congestion feels similar to previous spring allergies, but she has never had a cough like this.  Encouraged her to restart daily antihistamine.  Will  send [employer] to cover for atypical infxns given son's special needs.  Cough meds prn.  Reviewed supportive care and red flags that should prompt return.  Pt expressed understanding and is in agreement w/ plan.  ? ? ?Annye Asa, MD ?02/24/2022 ? ?

## 2022-03-09 ENCOUNTER — Encounter: Payer: Self-pay | Admitting: Family Medicine

## 2022-03-09 ENCOUNTER — Ambulatory Visit: Payer: 59 | Admitting: Family Medicine

## 2022-03-09 VITALS — BP 126/78 | HR 72 | Temp 97.9°F | Resp 18 | Ht 67.0 in | Wt 179.0 lb

## 2022-03-09 DIAGNOSIS — R051 Acute cough: Secondary | ICD-10-CM | POA: Diagnosis not present

## 2022-03-09 NOTE — Patient Instructions (Addendum)
If diarrhea worsens (6 per day, fever, or abdominal pain) - be seen as this can be another type of infection seen after using antibiotics.  ?I am glad to hear that the cough has improved.  ?Continue Flonase, over-the-counter antihistamine for allergies.  Tessalon Perles as needed for cough.  I expect symptoms to continue to improve.  If any worsening please call us for follow-up.  At this time I do not think an x-ray is needed but certainly follow-up if any worsening symptoms.  Take care! ?

## 2022-03-09 NOTE — Progress Notes (Signed)
? ?Subjective:  ?Patient ID: [name], female    DOB: 06-12-1971  Age: 51 y.o. MRN: [id number] ? ?CC:  ?Chief Complaint  ?Patient presents with  ? Cough  ?  Pt has had cough since 02/23/22 notes negative COVID test, household members had similar symptoms saw Dr Birdie Riddle was given tessalon and abx, was told by holistic dr who stated she has pneumonia on one side and has been taking amox-clav 875-'125mg'$  tablets 1 tablet BID for 14 days, pt wanted you to check her over as well as her sister passed of pneumonia   ? ? ?HPI ?[name] presents for  ? ?Cough ?Virtual visit 02/24/2022, cough 3 days at that time, sick contact at home.  Possible allergy component, versus atypicals. Neg covid test during this illness or exposure to covid.  Z-Pak was started.  Tessalon Perles.  ?Still had some rattle in chest. No fever. No dypsnea, wheeze.  ?Since virtual visit was seen by other provider, her holistic doctor on 03/05/22 - diagnosed with pneumonia based on her exam. Started on augmentin '875mg'$  BID for 2 weeks. Concerned as sister died of pneumonia. Feels better on Augmentin. Less rattle. Cough improving.  ?Diarrhea 4 times per day. No fever/abd pain.  ?Tessalon BID.  ? ?For allergies, has flonase using daily, otc [facility] antihistamine.  ? ? ? ?History ?Patient Active Problem List  ? Diagnosis Date Noted  ? Uterine fibroid 10/03/2019  ? Status post total hysterectomy 10/03/2019  ? Breast cancer screening 06/06/2019  ? Visit for preventive health examination 01/21/2018  ? Skin cysts, generalized 01/21/2018  ? Vitamin D deficiency 12/23/2016  ? Vitamin B12 deficiency 12/23/2016  ? Fibrocystic breast disease 12/23/2016  ? Seasonal allergic rhinitis 06/05/2013  ? Mitral regurgitation 06/02/2011  ? Hyperlipidemia 11/14/2008  ? Hypertension 07/13/2008  ? GILBERT'S SYNDROME 06/22/2007  ? Anxiety state 06/22/2007  ? PVC (premature ventricular contraction) 06/22/2007  ? ?Past Medical History:  ?Diagnosis Date  ? Anxiety   ? Rosanna Randy  syndrome   ? Heart murmur   ? Dr. Jens Som history of heart murmur  ? Herpes exposure   ? Type 1 & 2 ; Dr Garwin Brothers, Gyn  ? Hyperlipidemia   ? Hypertension   ? Iron deficiency anemia   ? Pre-diabetes   ? Premature ventricular contractions   ? Skin rash   ? waist area comes and goes  ? Vitamin B 12 deficiency   ? Vitamin D deficiency   ? History of, medication stopped because Vitamin d became too hogh  ? ?Past Surgical History:  ?Procedure Laterality Date  ? ABDOMINAL HYSTERECTOMY  09/2019  ? CESAREAN SECTION    ? X 2, Dr Garwin Brothers  ? IR RADIOLOGIST EVAL & MGMT  08/29/2019  ? ROBOTIC ASSISTED LAPAROSCOPIC HYSTERECTOMY AND SALPINGECTOMY Bilateral 10/03/2019  ? Procedure: XI ROBOTIC ASSISTED LAPAROSCOPIC HYSTERECTOMY AND SALPINGECTOMY;  Surgeon: Servando Salina, MD;  Location: [facility];  Service: Gynecology;  Laterality: Bilateral;  ? TUBAL LIGATION    ? WISDOM TOOTH EXTRACTION    ? ?Allergies  ?Allergen Reactions  ? Cephalexin   ?   03/2012 Dyspnea  ? Other   ?  Grapefruit due to Fluoxetine  ? Penicillins   ?  Unknown reaction as child  ? ?Prior to Admission medications   ?Medication Sig Start Date End Date Taking? Authorizing Provider  ?ALPRAZolam (XANAX) 0.25 MG tablet Take 1 tablet (0.25 mg total) by mouth 2 (two) times daily as needed for anxiety. 08/16/20  Brunetta Jeans, PA-C  ?azithromycin (ZITHROMAX) 250 MG tablet 2 tabs on day 1, 1 tab on day 2-5 02/24/22   Midge Minium, MD  ?benzonatate (TESSALON) 100 MG capsule Take 1 capsule (100 mg total) by mouth 3 (three) times daily as needed for cough. 02/24/22   Midge Minium, MD  ?Cholecalciferol (VITAMIN D3) 10000 units TABS Take 1 tablet by mouth daily. 12/23/16   Binnie Rail, MD  ?clobetasol ointment (TEMOVATE) 0.05 % APPLY TOPICALLY TO THE AFFECTED AREA TWICE DAILY 11/24/21   Sheffield, Vida Roller R, PA-C  ?clotrimazole-betamethasone (LOTRISONE) cream Apply 1 application topically daily as needed (rash).    [provider]   ?FLUoxetine (PROZAC) 20 MG tablet TAKE 1 TABLET(20 MG) BY MOUTH DAILY 09/08/21   Wendie Agreste, MD  ?fluticasone (FLONASE) 50 MCG/ACT nasal spray Place 2 sprays into both nostrils daily. 11/26/21   Saguier, Percell Miller, PA-C  ?metoprolol succinate (TOPROL-XL) 25 MG 24 hr tablet TAKE 1 TABLET(25 MG) BY MOUTH DAILY. 09/08/21   Wendie Agreste, MD  ?Multiple Vitamin (MULTIVITAMIN) tablet Take 1 tablet by mouth daily.    [provider]  ?triamcinolone ointment (KENALOG) 0.5 % Apply 1 application topically 2 (two) times daily as needed (rash). 09/08/21   Wendie Agreste, MD  ? ?Social History  ? ?Socioeconomic History  ? Marital status: Married  ?  Spouse name: Not on file  ? Number of children: Not on file  ? Years of education: Not on file  ? Highest education level: Not on file  ?Occupational History  ? Not on file  ?Tobacco Use  ? Smoking status: Never  ? Smokeless tobacco: Never  ?Vaping Use  ? Vaping Use: Never used  ?Substance and Sexual Activity  ? Alcohol use: No  ? Drug use: No  ? Sexual activity: Not on file  ?Other Topics Concern  ? Not on file  ?Social History Narrative  ? Not on file  ? ?Social Determinants of Health  ? ?Financial Resource Strain: Not on file  ?Food Insecurity: Not on file  ?Transportation Needs: Not on file  ?Physical Activity: Not on file  ?Stress: Not on file  ?Social Connections: Not on file  ?Intimate Partner Violence: Not on file  ? ? ?Review of Systems ?Per ROS ? ? ?Objective:  ? ?Vitals:  ? 03/09/22 0812  ?BP: 126/78  ?Pulse: 72  ?Resp: 18  ?Temp: 97.9 ?F (36.6 ?C)  ?TempSrc: Temporal  ?SpO2: 98%  ?Weight: 179 lb (81.2 kg)  ?Height: '5\' 7"'$  (1.702 m)  ? ? ? ?Physical Exam ?Vitals reviewed.  ?Constitutional:   ?   General: She is not in acute distress. ?   Appearance: She is well-developed.  ?HENT:  ?   Head: Normocephalic and atraumatic.  ?   Right Ear: Hearing, tympanic membrane, ear canal and external ear normal.  ?   Left Ear: Hearing, tympanic membrane, ear canal and  external ear normal.  ?   Nose: Nose normal.  ?   Mouth/Throat:  ?   Pharynx: No posterior oropharyngeal erythema.  ?Eyes:  ?   Conjunctiva/sclera: Conjunctivae normal.  ?   Pupils: Pupils are equal, round, and reactive to light.  ?Cardiovascular:  ?   Rate and Rhythm: Normal rate and regular rhythm.  ?   Heart sounds: Normal heart sounds. No murmur heard. ?Pulmonary:  ?   Effort: Pulmonary effort is normal. No respiratory distress.  ?   Breath sounds: Normal breath sounds. No wheezing or  rhonchi.  ?   Comments: lungs clear.  ?Skin: ?   General: Skin is warm and dry.  ?   Findings: No rash.  ?Neurological:  ?   Mental Status: She is alert and oriented to person, place, and time.  ?Psychiatric:     ?   Mood and Affect: Mood normal.     ?   Behavior: Behavior normal.  ? ? ? ? ? ?Assessment & Plan:  ?KARALINE BURESH is a 51 y.o. female . ?Acute cough ?Viral syndrome versus early community-acquired pneumonia.  Status post azithromycin and now Augmentin.  Some diarrhea possibly from Augmentin, less likely C. difficile.  RTC precautions given.  Continue Tessalon Perles, Flonase and loratadine for allergies, probiotic or yogurt to help with diarrhea.  Lungs clear on exam, symptoms improving, deferred x-ray at this time with RTC precautions. ? ?No orders of the defined types were placed in this encounter. ? ?Patient Instructions  ?If diarrhea worsens (6 per day, fever, or abdominal pain) - be seen as this can be another type of infection seen after using antibiotics.  ?I am glad to hear that the cough has improved.  ?Continue Flonase, over-the-counter antihistamine for allergies.  Tessalon Perles as needed for cough.  I expect symptoms to continue to improve.  If any worsening please call us for follow-up.  At this time I do not think an x-ray is needed but certainly follow-up if any worsening symptoms.  Take care! ? ? ? ?Signed,  ? ?Merri Ray, MD ?[facility] ?[facility] ?03/09/22 ?8:34 AM ? ? ?

## 2022-04-24 ENCOUNTER — Other Ambulatory Visit: Payer: Self-pay | Admitting: Family Medicine

## 2022-04-24 DIAGNOSIS — I10 Essential (primary) hypertension: Secondary | ICD-10-CM

## 2022-05-15 ENCOUNTER — Other Ambulatory Visit: Payer: Self-pay | Admitting: Medical

## 2022-06-24 ENCOUNTER — Other Ambulatory Visit: Payer: Self-pay | Admitting: Medical

## 2022-09-23 ENCOUNTER — Other Ambulatory Visit: Payer: Self-pay | Admitting: Lab

## 2022-09-23 ENCOUNTER — Other Ambulatory Visit: Payer: Self-pay | Admitting: Family Medicine

## 2022-09-23 DIAGNOSIS — F411 Generalized anxiety disorder: Secondary | ICD-10-CM

## 2022-09-23 MED ORDER — FLUOXETINE HCL 20 MG PO TABS: ORAL_TABLET | ORAL | 1 refills | Status: DC

## 2022-10-21 ENCOUNTER — Other Ambulatory Visit: Payer: Self-pay | Admitting: Family Medicine

## 2022-10-21 ENCOUNTER — Other Ambulatory Visit: Payer: Self-pay | Admitting: Lab

## 2022-10-21 DIAGNOSIS — I10 Essential (primary) hypertension: Secondary | ICD-10-CM

## 2022-10-21 MED ORDER — METOPROLOL SUCCINATE ER 25 MG PO TB24: ORAL_TABLET | ORAL | 1 refills | Status: DC

## 2022-11-30 ENCOUNTER — Ambulatory Visit: Payer: 59

## 2023-01-04 ENCOUNTER — Other Ambulatory Visit: Payer: Self-pay | Admitting: Family Medicine

## 2023-01-06 ENCOUNTER — Ambulatory Visit (INDEPENDENT_AMBULATORY_CARE_PROVIDER_SITE_OTHER): Payer: 59 | Admitting: Family Medicine

## 2023-01-06 ENCOUNTER — Encounter: Payer: Self-pay | Admitting: Family Medicine

## 2023-01-06 VITALS — BP 118/70 | HR 69 | Temp 97.7°F | Ht 67.5 in | Wt 189.2 lb

## 2023-01-06 DIAGNOSIS — I10 Essential (primary) hypertension: Secondary | ICD-10-CM

## 2023-01-06 DIAGNOSIS — Z1321 Encounter for screening for nutritional disorder: Secondary | ICD-10-CM

## 2023-01-06 DIAGNOSIS — F411 Generalized anxiety disorder: Secondary | ICD-10-CM

## 2023-01-06 DIAGNOSIS — Z0000 Encounter for general adult medical examination without abnormal findings: Secondary | ICD-10-CM

## 2023-01-06 DIAGNOSIS — Z1329 Encounter for screening for other suspected endocrine disorder: Secondary | ICD-10-CM | POA: Diagnosis not present

## 2023-01-06 DIAGNOSIS — R7303 Prediabetes: Secondary | ICD-10-CM

## 2023-01-06 DIAGNOSIS — R6889 Other general symptoms and signs: Secondary | ICD-10-CM

## 2023-01-06 DIAGNOSIS — E785 Hyperlipidemia, unspecified: Secondary | ICD-10-CM

## 2023-01-06 DIAGNOSIS — Z13228 Encounter for screening for other metabolic disorders: Secondary | ICD-10-CM

## 2023-01-06 DIAGNOSIS — Z130 Encounter for screening for diseases of the blood and blood-forming organs and certain disorders involving the immune mechanism: Secondary | ICD-10-CM

## 2023-01-06 DIAGNOSIS — L910 Hypertrophic scar: Secondary | ICD-10-CM

## 2023-01-06 MED ORDER — METOPROLOL SUCCINATE ER 25 MG PO TB24: ORAL_TABLET | ORAL | 3 refills | Status: DC

## 2023-01-06 MED ORDER — FLUOXETINE HCL 20 MG PO TABS: ORAL_TABLET | ORAL | 3 refills | Status: DC

## 2023-01-06 NOTE — Patient Instructions (Addendum)
I will check labs as we discussed.  If those are abnormal we can discuss next step. Follow up with gynecology, and please schedule mammogram as soon as possible.  Thanks for coming in today.  If there are any concerns we did not have a chance to discuss today, please schedule separate visit, but otherwise I am happy to see you in 6 months.  Take care!  Preventive Care 50-52 Years Old, Female Preventive care refers to lifestyle choices and visits with your health care provider that can promote health and wellness. Preventive care visits are also called wellness exams. What can I expect for my preventive care visit? Counseling Your health care provider may ask you questions about your: Medical history, including: Past medical problems. Family medical history. Pregnancy history. Current health, including: Menstrual cycle. Method of birth control. Emotional well-being. Home life and relationship well-being. Sexual activity and sexual health. Lifestyle, including: Alcohol, nicotine or tobacco, and drug use. Access to firearms. Diet, exercise, and sleep habits. Work and work Statistician. Sunscreen use. Safety issues such as seatbelt and bike helmet use. Physical exam Your health care provider will check your: Height and weight. These may be used to calculate your BMI (body mass index). BMI is a measurement that tells if you are at a healthy weight. Waist circumference. This measures the distance around your waistline. This measurement also tells if you are at a healthy weight and may help predict your risk of certain diseases, such as type 2 diabetes and high blood pressure. Heart rate and blood pressure. Body temperature. Skin for abnormal spots. What immunizations do I need?  Vaccines are usually given at various ages, according to a schedule. Your health care provider will recommend vaccines for you based on your age, medical history, and lifestyle or other factors, such as travel or  where you work. What tests do I need? Screening Your health care provider may recommend screening tests for certain conditions. This may include: Lipid and cholesterol levels. Diabetes screening. This is done by checking your blood sugar (glucose) after you have not eaten for a while (fasting). Pelvic exam and Pap test. Hepatitis B test. Hepatitis C test. HIV (human immunodeficiency virus) test. STI (sexually transmitted infection) testing, if you are at risk. Lung cancer screening. Colorectal cancer screening. Mammogram. Talk with your health care provider about when you should start having regular mammograms. This may depend on whether you have a family history of breast cancer. BRCA-related cancer screening. This may be done if you have a family history of breast, ovarian, tubal, or peritoneal cancers. Bone density scan. This is done to screen for osteoporosis. Talk with your health care provider about your test results, treatment options, and if necessary, the need for more tests. Follow these instructions at home: Eating and drinking  Eat a diet that includes fresh fruits and vegetables, whole grains, lean protein, and low-fat dairy products. Take vitamin and mineral supplements as recommended by your health care provider. Do not drink alcohol if: Your health care provider tells you not to drink. You are pregnant, may be pregnant, or are planning to become pregnant. If you drink alcohol: Limit how much you have to 0-1 drink a day. Know how much alcohol is in your drink. In the [place] one drink equals one 12 oz bottle of beer (355 mL), one 5 oz glass of wine (148 mL), or one 1 oz glass of hard liquor (44 mL). Lifestyle Brush your teeth every morning and night with fluoride toothpaste. Floss  one time each day. Exercise for at least 30 minutes 5 or more days each week. Do not use any products that contain nicotine or tobacco. These products include cigarettes, chewing tobacco, and  vaping devices, such as e-cigarettes. If you need help quitting, ask your health care provider. Do not use drugs. If you are sexually active, practice safe sex. Use a condom or other form of protection to prevent STIs. If you do not wish to become pregnant, use a form of birth control. If you plan to become pregnant, see your health care provider for a prepregnancy visit. Take aspirin only as told by your health care provider. Make sure that you understand how much to take and what form to take. Work with your health care provider to find out whether it is safe and beneficial for you to take aspirin daily. Find healthy ways to manage stress, such as: Meditation, yoga, or listening to music. Journaling. Talking to a trusted person. Spending time with friends and family. Minimize exposure to UV radiation to reduce your risk of skin cancer. Safety Always wear your seat belt while driving or riding in a vehicle. Do not drive: If you have been drinking alcohol. Do not ride with someone who has been drinking. When you are tired or distracted. While texting. If you have been using any mind-altering substances or drugs. Wear a helmet and other protective equipment during sports activities. If you have firearms in your house, make sure you follow all gun safety procedures. Seek help if you have been physically or sexually abused. What's next? Visit your health care provider once a year for an annual wellness visit. Ask your health care provider how often you should have your eyes and teeth checked. Stay up to date on all vaccines. This information is not intended to replace advice given to you by your health care provider. Make sure you discuss any questions you have with your health care provider. Document Revised: 06/04/2021 Document Reviewed: 06/04/2021 Elsevier Patient Education  [place].

## 2023-01-06 NOTE — Progress Notes (Signed)
Subjective:  Patient ID: [name], female    DOB: 11/02/1971  Age: 52 y.o. MRN: [id number]  CC:  Chief Complaint  Patient presents with   Annual Exam    Pt doing okay needs some refills, fasting today, pt would like lab to check copper celinium and needs a referral to a new derm her derm closed its doors     HPI [name] presents for Annual Exam Doing well.  No recent health changes.   Hypertension: With history of PVCs, mitral regurgitation.  Toprol-XL 25 mg daily. No recent cardiology eval. No recent palpitations or chest pain.  Home readings: none. Usually can tell when BP is high.  BP Readings from Last 3 Encounters:  01/06/23 118/70  03/09/22 126/78  11/26/21 (!) 141/87   Lab Results  Component Value Date   CREATININE 0.86 09/08/2021   Anxiety: Stress with special needs child. 33rd surgery tomorrow at [facility]. Retinal hole and dental exam. Total care with behavioral care issues.  Prozac '20mg'$  qd - working ok. Rare need for xanax with increased stressors - 2-3x/month. Controlled substance database (PDMP) reviewed. No concerns appreciated.   Vitamin D deficiency History of vitamin D deficiency, reading in the 20 range per notes from Dr. Linna Darner in 2016.  Has treated with 10,000 units few times per week.   Vit D through [facility] and IV therapy every few months. Dr. Dalbert Batman - family functional medicine provider. Monitoring vit D at Dr. Dalbert Batman - about 57 4 months ago.  Also would like to have her copper, chromium and selenium checked based on some things she has read. No current supplementation with these. Aware that insurance may not cover these tests and ok with paying out of pocket if needed.  Cold natured at night but no hair, skin changes or new palpitations. Last vitamin D Lab Results  Component Value Date   VD25OH 67.56 06/07/2020   Hyperlipidemia: Elevated previously but not on statin.  Discussed with previous primary care provider including over-the-counter red  yeast rice supplement, exercise, diet changes. The 10-year ASCVD risk score (Arnett DK, et al., 2019) is: 2.5%   Values used to calculate the score:     Age: 19 years     Sex: Female     Is Non-Hispanic African American: Yes     Diabetic: No     Tobacco smoker: No     Systolic Blood Pressure: 213 mmHg     Is BP treated: Yes     HDL Cholesterol: 64.9 mg/dL     Total Cholesterol: 234 mg/dL  Lab Results  Component Value Date   CHOL 234 (H) 09/08/2021   HDL 64.90 09/08/2021   LDLCALC 156 (H) 09/08/2021   LDLDIRECT 161.0 06/05/2013   TRIG 67.0 09/08/2021   CHOLHDL 4 09/08/2021   Lab Results  Component Value Date   ALT 17 09/08/2021   AST 18 09/08/2021   ALKPHOS 72 09/08/2021   BILITOT 0.8 09/08/2021   Prediabetes: Barely at prediabetes range with previous provider, as above diet/exercise changes recommended. Lab Results  Component Value Date   HGBA1C 5.8 06/07/2020   Wt Readings from Last 3 Encounters:  01/06/23 189 lb 3.2 oz (85.8 kg)  03/09/22 179 lb (81.2 kg)  09/08/21 182 lb 9.6 oz (82.8 kg)       01/06/2023   10:17 AM 03/09/2022    8:15 AM 06/07/2020    8:44 AM 06/06/2019    8:14 AM 04/05/2019    9:01 AM  Depression screen PHQ 2/9  Decreased Interest 0 0 0 1 0  Down, Depressed, Hopeless 0 0 0 1 0  PHQ - 2 Score 0 0 0 2 0  Altered sleeping 0 0  0 0  Tired, decreased energy 0 1  0 0  Change in appetite 0 0  0 0  Feeling bad or failure about yourself  0 0  0 0  Trouble concentrating 0 1  0 0  Moving slowly or fidgety/restless 0 0  0 0  Suicidal thoughts 0 0  0 0  PHQ-9 Score 0 2  2 0  Difficult doing work/chores    Somewhat difficult Not difficult at all    Health Maintenance  Topic Date Due   Hepatitis C Screening  03/10/2023 (Originally 11/15/1989)   HIV Screening  03/10/2023 (Originally 11/15/1986)   INFLUENZA VACCINE  03/21/2023 (Originally 07/21/2022)   Zoster Vaccines- Shingrix (1 of 2) 04/07/2023 (Originally 11/15/2021)   MAMMOGRAM  01/07/2024  (Originally 07/12/2021)   DTaP/Tdap/Td (3 - Td or Tdap) 10/02/2027   COLONOSCOPY (Pts 45-51yr Insurance coverage will need to be confirmed)  07/08/2031   HPV VACCINES  Aged Out   PAP SMEAR-Modifier  Discontinued  She is followed by GYN.  Dr. CGarwin Brothers- s/p robotic hysterectomy in 2020.  Fibroids.  Colonoscopy July 2022. Father with stage 4 colon CA.  Repeat in 3-5 years.  Plans to schedule mammogram with GYN soon.  Previously saw dermatology, needs new referral as prior office closed. Hx of keloids, prurigo nodularis. Requests new derm referral.   Immunization History  Administered Date(s) Administered   Influenza-Unspecified 10/21/2018   Td 04/23/2006   Tdap 10/01/2017  Flu vaccine - declines Shingles vaccine - declines.  HIV/Hep C - declines.   No results found. Last eye visit - about 1.5 yrs ago. Plans to schedule follow up.   Dental:Within Last 6 months Dr. BGloriann Loan   Alcohol:none  Tobacco: none  Exercise: active with son, walking with stroller 3-4 times per week. 45 min.    History Patient Active Problem List   Diagnosis Date Noted   Uterine fibroid 10/03/2019   Status post total hysterectomy 10/03/2019   Breast cancer screening 06/06/2019   Visit for preventive health examination 01/21/2018   Skin cysts, generalized 01/21/2018   Vitamin D deficiency 12/23/2016   Vitamin B12 deficiency 12/23/2016   Fibrocystic breast disease 12/23/2016   Seasonal allergic rhinitis 06/05/2013   Mitral regurgitation 06/02/2011   Hyperlipidemia 11/14/2008   Hypertension 07/13/2008   GILBERT'S SYNDROME 06/22/2007   Anxiety state 06/22/2007   PVC (premature ventricular contraction) 06/22/2007   Past Medical History:  Diagnosis Date   Anxiety    Gilbert syndrome    Heart murmur    Dr. KJens Somhistory of heart murmur   Herpes exposure    Type 1 & 2 ; Dr CGarwin Brothers [facility]   Hyperlipidemia    Hypertension    Iron deficiency anemia    Pre-diabetes    Premature ventricular contractions     Skin rash    waist area comes and goes   Vitamin B 12 deficiency    Vitamin D deficiency    History of, medication stopped because Vitamin d became too hogh   Past Surgical History:  Procedure Laterality Date   ABDOMINAL HYSTERECTOMY  09/2019   CESAREAN SECTION     X 2, Dr CGarwin Brothers  IR RADIOLOGIST EVAL & MGMT  08/29/2019   ROBOTIC ASSISTED LAPAROSCOPIC HYSTERECTOMY AND SALPINGECTOMY Bilateral 10/03/2019  Procedure: XI ROBOTIC ASSISTED LAPAROSCOPIC HYSTERECTOMY AND SALPINGECTOMY;  Surgeon: Servando Salina, MD;  Location: [facility];  Service: Gynecology;  Laterality: Bilateral;   TUBAL LIGATION     WISDOM TOOTH EXTRACTION     Allergies  Allergen Reactions   Cephalexin      03/2012 Dyspnea   Other     Grapefruit due to Fluoxetine   Penicillins     Unknown reaction as child   Prior to Admission medications   Medication Sig Start Date End Date Taking? Authorizing Provider  ALPRAZolam (XANAX) 0.25 MG tablet Take 1 tablet (0.25 mg total) by mouth 2 (two) times daily as needed for anxiety. 08/16/20  Yes Brunetta Jeans, PA-C  Cholecalciferol (VITAMIN D3) 10000 units TABS Take 1 tablet by mouth daily. 12/23/16  Yes Burns, Claudina Lick, MD  clobetasol ointment (TEMOVATE) 0.05 % APPLY TOPICALLY TO THE AFFECTED AREA TWICE DAILY 11/24/21  Yes Sheffield, Roy R, PA-C  clotrimazole-betamethasone (LOTRISONE) cream Apply 1 application topically daily as needed (rash).   Yes [provider]  FLUoxetine (PROZAC) 20 MG tablet TAKE 1 TABLET BY MOUTH DAILY 09/23/22  Yes Midge Minium, MD  fluticasone [facility] 50 MCG/ACT nasal spray SHAKE LIQUID AND USE 2 SPRAYS IN EACH NOSTRIL DAILY 01/04/23  Yes Terrilyn Saver, NP  metoprolol succinate (TOPROL-XL) 25 MG 24 hr tablet TAKE 1 TABLET(25 MG) BY MOUTH DAILY 10/21/22  Yes Wendie Agreste, MD  Multiple Vitamin (MULTIVITAMIN) tablet Take 1 tablet by mouth daily.   Yes [provider]  triamcinolone ointment (KENALOG)  0.5 % Apply 1 application topically 2 (two) times daily as needed (rash). 09/08/21  Yes Wendie Agreste, MD   Social History   Socioeconomic History   Marital status: Married    Spouse name: Not on file   Number of children: Not on file   Years of education: Not on file   Highest education level: Not on file  Occupational History   Not on file  Tobacco Use   Smoking status: Never   Smokeless tobacco: Never  Vaping Use   Vaping Use: Never used  Substance and Sexual Activity   Alcohol use: No   Drug use: No   Sexual activity: Yes    Birth control/protection: Surgical  Other Topics Concern   Not on file  Social History Narrative   Not on file   Social Determinants of Health   Financial Resource Strain: Not on file  Food Insecurity: Not on file  Transportation Needs: Not on file  Physical Activity: Not on file  Stress: Not on file  Social Connections: Not on file  Intimate Partner Violence: Not on file    Review of Systems  13 point review of systems per patient health survey noted.  Negative other than as indicated above or in HPI.   Objective:   Vitals:   01/06/23 1019  BP: 118/70  Pulse: 69  Temp: 97.7 F (36.5 C)  TempSrc: Oral  SpO2: 98%  Weight: 189 lb 3.2 oz (85.8 kg)  Height: 5' 7.5" (1.715 m)     Physical Exam Constitutional:      Appearance: She is well-developed.  HENT:     Head: Normocephalic and atraumatic.     Right Ear: External ear normal.     Left Ear: External ear normal.  Eyes:     Conjunctiva/sclera: Conjunctivae normal.     Pupils: Pupils are equal, round, and reactive to light.  Neck:     Thyroid:  No thyromegaly.  Cardiovascular:     Rate and Rhythm: Normal rate and regular rhythm.     Heart sounds: Normal heart sounds. No murmur heard. Pulmonary:     Effort: Pulmonary effort is normal. No respiratory distress.     Breath sounds: Normal breath sounds. No wheezing.  Abdominal:     General: Bowel sounds are normal.      Palpations: Abdomen is soft.     Tenderness: There is no abdominal tenderness.  Musculoskeletal:        General: No tenderness. Normal range of motion.     Cervical back: Normal range of motion and neck supple.  Lymphadenopathy:     Cervical: No cervical adenopathy.  Skin:    General: Skin is warm and dry.     Findings: No rash.  Neurological:     Mental Status: She is alert and oriented to person, place, and time.  Psychiatric:        Behavior: Behavior normal.        Thought Content: Thought content normal.     Assessment & Plan:  [name] is a 51 y.o. female . Annual physical exam  - -anticipatory guidance as below in AVS, screening labs above. Health maintenance items as above in HPI discussed/recommended as applicable.   Screening for endocrine, nutritional, metabolic and immunity disorder - Plan: Ceruloplasmin, Chromium level, Selenium serum  -Labs ordered as above as requested, depending on results can discuss with functional medicine provider or if abnormal can review plan further.  Essential hypertension - Plan: metoprolol succinate (TOPROL-XL) 25 MG 24 hr tablet  -Overall stable, continue same med regimen.  Anxiety state - Plan: FLUoxetine (PROZAC) 20 MG tablet  -Reports overall stable symptoms, will continue fluoxetine same dose.  Hyperlipidemia, unspecified hyperlipidemia type - Plan: Comprehensive metabolic panel, Lipid panel  -Repeat labs but low ASCVD risk score.  No new meds for now  Prediabetes - Plan: Hemoglobin A1c  -Check A1c, watch diet, exercise.  Keloid - Plan: Ambulatory referral to Dermatology  -New dermatologist needed, referral placed  Cold intolerance - Plan: TSH Screening for thyroid disorder - Plan: TSH  -Cold natured, check TSH.  Meds ordered this encounter  Medications   FLUoxetine (PROZAC) 20 MG tablet    Sig: TAKE 1 TABLET BY MOUTH DAILY    Dispense:  90 tablet    Refill:  3   metoprolol succinate (TOPROL-XL) 25 MG 24 hr tablet     Sig: TAKE 1 TABLET(25 MG) BY MOUTH DAILY    Dispense:  90 tablet    Refill:  3   Patient Instructions  I will check labs as we discussed.  If those are abnormal we can discuss next step. Follow up with gynecology, and please schedule mammogram as soon as possible.  Thanks for coming in today.  If there are any concerns we did not have a chance to discuss today, please schedule separate visit, but otherwise I am happy to see you in 6 months.  Take care!  Preventive Care 38-59 Years Old, Female Preventive care refers to lifestyle choices and visits with your health care provider that can promote health and wellness. Preventive care visits are also called wellness exams. What can I expect for my preventive care visit? Counseling Your health care provider may ask you questions about your: Medical history, including: Past medical problems. Family medical history. Pregnancy history. Current health, including: Menstrual cycle. Method of birth control. Emotional well-being. Home life and relationship well-being. Sexual activity  and sexual health. Lifestyle, including: Alcohol, nicotine or tobacco, and drug use. Access to firearms. Diet, exercise, and sleep habits. Work and work Statistician. Sunscreen use. Safety issues such as seatbelt and bike helmet use. Physical exam Your health care provider will check your: Height and weight. These may be used to calculate your BMI (body mass index). BMI is a measurement that tells if you are at a healthy weight. Waist circumference. This measures the distance around your waistline. This measurement also tells if you are at a healthy weight and may help predict your risk of certain diseases, such as type 2 diabetes and high blood pressure. Heart rate and blood pressure. Body temperature. Skin for abnormal spots. What immunizations do I need?  Vaccines are usually given at various ages, according to a schedule. Your health care provider will  recommend vaccines for you based on your age, medical history, and lifestyle or other factors, such as travel or where you work. What tests do I need? Screening Your health care provider may recommend screening tests for certain conditions. This may include: Lipid and cholesterol levels. Diabetes screening. This is done by checking your blood sugar (glucose) after you have not eaten for a while (fasting). Pelvic exam and Pap test. Hepatitis B test. Hepatitis C test. HIV (human immunodeficiency virus) test. STI (sexually transmitted infection) testing, if you are at risk. Lung cancer screening. Colorectal cancer screening. Mammogram. Talk with your health care provider about when you should start having regular mammograms. This may depend on whether you have a family history of breast cancer. BRCA-related cancer screening. This may be done if you have a family history of breast, ovarian, tubal, or peritoneal cancers. Bone density scan. This is done to screen for osteoporosis. Talk with your health care provider about your test results, treatment options, and if necessary, the need for more tests. Follow these instructions at home: Eating and drinking  Eat a diet that includes fresh fruits and vegetables, whole grains, lean protein, and low-fat dairy products. Take vitamin and mineral supplements as recommended by your health care provider. Do not drink alcohol if: Your health care provider tells you not to drink. You are pregnant, may be pregnant, or are planning to become pregnant. If you drink alcohol: Limit how much you have to 0-1 drink a day. Know how much alcohol is in your drink. In the [place] one drink equals one 12 oz bottle of beer (355 mL), one 5 oz glass of wine (148 mL), or one 1 oz glass of hard liquor (44 mL). Lifestyle Brush your teeth every morning and night with fluoride toothpaste. Floss one time each day. Exercise for at least 30 minutes 5 or more days each week. Do  not use any products that contain nicotine or tobacco. These products include cigarettes, chewing tobacco, and vaping devices, such as e-cigarettes. If you need help quitting, ask your health care provider. Do not use drugs. If you are sexually active, practice safe sex. Use a condom or other form of protection to prevent STIs. If you do not wish to become pregnant, use a form of birth control. If you plan to become pregnant, see your health care provider for a prepregnancy visit. Take aspirin only as told by your health care provider. Make sure that you understand how much to take and what form to take. Work with your health care provider to find out whether it is safe and beneficial for you to take aspirin daily. Find healthy ways to manage  stress, such as: Meditation, yoga, or listening to music. Journaling. Talking to a trusted person. Spending time with friends and family. Minimize exposure to UV radiation to reduce your risk of skin cancer. Safety Always wear your seat belt while driving or riding in a vehicle. Do not drive: If you have been drinking alcohol. Do not ride with someone who has been drinking. When you are tired or distracted. While texting. If you have been using any mind-altering substances or drugs. Wear a helmet and other protective equipment during sports activities. If you have firearms in your house, make sure you follow all gun safety procedures. Seek help if you have been physically or sexually abused. What's next? Visit your health care provider once a year for an annual wellness visit. Ask your health care provider how often you should have your eyes and teeth checked. Stay up to date on all vaccines. This information is not intended to replace advice given to you by your health care provider. Make sure you discuss any questions you have with your health care provider. Document Revised: 06/04/2021 Document Reviewed: 06/04/2021 Elsevier Patient Education  [place],   Merri Ray, MD [place] 01/06/23 11:29 AM

## 2023-01-08 ENCOUNTER — Other Ambulatory Visit: Payer: Self-pay | Admitting: Family Medicine

## 2023-01-08 ENCOUNTER — Other Ambulatory Visit (INDEPENDENT_AMBULATORY_CARE_PROVIDER_SITE_OTHER): Payer: 59

## 2023-01-08 DIAGNOSIS — R6889 Other general symptoms and signs: Secondary | ICD-10-CM | POA: Diagnosis not present

## 2023-01-08 DIAGNOSIS — Z130 Encounter for screening for diseases of the blood and blood-forming organs and certain disorders involving the immune mechanism: Secondary | ICD-10-CM

## 2023-01-08 DIAGNOSIS — Z1329 Encounter for screening for other suspected endocrine disorder: Secondary | ICD-10-CM | POA: Diagnosis not present

## 2023-01-08 DIAGNOSIS — R7303 Prediabetes: Secondary | ICD-10-CM | POA: Diagnosis not present

## 2023-01-08 DIAGNOSIS — L249 Irritant contact dermatitis, unspecified cause: Secondary | ICD-10-CM

## 2023-01-08 DIAGNOSIS — E785 Hyperlipidemia, unspecified: Secondary | ICD-10-CM

## 2023-01-08 LAB — LIPID PANEL
Cholesterol: 245 mg/dL — ABNORMAL HIGH (ref 0–200)
HDL: 60.8 mg/dL (ref >39.00)
LDL Cholesterol: 170 mg/dL — ABNORMAL HIGH (ref 0–99)
NonHDL: 184.38
Total CHOL/HDL Ratio: 4
Triglycerides: 74 mg/dL (ref 0.0–149.0)
VLDL: 14.8 mg/dL (ref 0.0–40.0)

## 2023-01-08 LAB — COMPREHENSIVE METABOLIC PANEL
ALT: 11 U/L (ref 0–35)
AST: 14 U/L (ref 0–37)
Albumin: 4.3 g/dL (ref 3.5–5.2)
Alkaline Phosphatase: 69 U/L (ref 39–117)
BUN: 13 mg/dL (ref 6–23)
CO2: 25 mEq/L (ref 19–32)
Calcium: 8.8 mg/dL (ref 8.4–10.5)
Chloride: 105 mEq/L (ref 96–112)
Creatinine, Ser: 0.88 mg/dL (ref 0.40–1.20)
GFR: 76.24 mL/min (ref >60.00)
Glucose, Bld: 87 mg/dL (ref 70–99)
Potassium: 4.4 mEq/L (ref 3.5–5.1)
Sodium: 140 mEq/L (ref 135–145)
Total Bilirubin: 0.9 mg/dL (ref 0.2–1.2)
Total Protein: 7.1 g/dL (ref 6.0–8.3)

## 2023-01-08 LAB — HEMOGLOBIN A1C: Hgb A1c MFr Bld: 5.9 % (ref 4.6–6.5)

## 2023-01-08 LAB — TSH: TSH: 1.69 u[IU]/mL (ref 0.35–5.50)

## 2023-01-11 LAB — CERULOPLASMIN: Ceruloplasmin: 37 mg/dL (ref 18–53)

## 2023-01-11 LAB — SELENIUM SERUM: Selenium, Blood: 123 mcg/L (ref 63–160)

## 2023-01-11 LAB — CHROMIUM LEVEL: Chromium: 0.6 mcg/L (ref <=1.2)

## 2023-03-05 ENCOUNTER — Other Ambulatory Visit: Payer: Self-pay | Admitting: Family Medicine

## 2023-04-08 ENCOUNTER — Telehealth: Payer: Self-pay | Admitting: Family Medicine

## 2023-04-08 DIAGNOSIS — F411 Generalized anxiety disorder: Secondary | ICD-10-CM

## 2023-04-08 NOTE — Telephone Encounter (Signed)
Encourage patient to contact the pharmacy for refills or they can request refills through [facility]  WHAT PHARMACY WOULD THEY LIKE THIS SENT TO:  [id number] - [place], [place] - [address] AT [facility] [address] & [address]   MEDICATION NAME & DOSE: ALPRAZolam (XANAX) 0.25 MG tablet   NOTES/COMMENTS FROM PATIENT:      Front office please notify patient: It takes 48-72 hours to process rx refill requests Ask patient to call pharmacy to ensure rx is ready before heading there.

## 2023-04-09 MED ORDER — ALPRAZOLAM 0.25 MG PO TABS: 0.2500 mg | ORAL_TABLET | Freq: Two times a day (BID) | ORAL | 0 refills | Status: DC | PRN

## 2023-04-09 NOTE — Addendum Note (Signed)
Addended by: Meredith Staggers R on: 04/09/2023 02:01 PM   Modules accepted: Orders

## 2023-04-09 NOTE — Telephone Encounter (Signed)
Anxiety and alprazolam were discussed at her January 17 visit, infrequent use.  Controlled substance database reviewed.  No recent prescription.  Alprazolam ordered.

## 2023-05-07 ENCOUNTER — Other Ambulatory Visit: Payer: Self-pay | Admitting: Family Medicine

## 2023-07-06 ENCOUNTER — Other Ambulatory Visit: Payer: Self-pay | Admitting: Family Medicine

## 2023-07-12 ENCOUNTER — Ambulatory Visit: Payer: 59 | Admitting: Family Medicine

## 2023-09-03 ENCOUNTER — Other Ambulatory Visit: Payer: Self-pay | Admitting: Family Medicine

## 2023-10-25 ENCOUNTER — Ambulatory Visit: Payer: 59 | Admitting: Family Medicine

## 2023-10-25 ENCOUNTER — Encounter: Payer: Self-pay | Admitting: Family Medicine

## 2023-10-25 VITALS — BP 122/70 | HR 62 | Temp 98.4°F | Ht 67.5 in | Wt 190.6 lb

## 2023-10-25 DIAGNOSIS — R222 Localized swelling, mass and lump, trunk: Secondary | ICD-10-CM | POA: Diagnosis not present

## 2023-10-25 DIAGNOSIS — R21 Rash and other nonspecific skin eruption: Secondary | ICD-10-CM | POA: Diagnosis not present

## 2023-10-25 DIAGNOSIS — I10 Essential (primary) hypertension: Secondary | ICD-10-CM

## 2023-10-25 DIAGNOSIS — L249 Irritant contact dermatitis, unspecified cause: Secondary | ICD-10-CM

## 2023-10-25 DIAGNOSIS — F411 Generalized anxiety disorder: Secondary | ICD-10-CM

## 2023-10-25 DIAGNOSIS — R7303 Prediabetes: Secondary | ICD-10-CM | POA: Diagnosis not present

## 2023-10-25 DIAGNOSIS — E785 Hyperlipidemia, unspecified: Secondary | ICD-10-CM | POA: Diagnosis not present

## 2023-10-25 LAB — LIPID PANEL
Cholesterol: 237 mg/dL — ABNORMAL HIGH (ref 0–200)
HDL: 62 mg/dL (ref >39.00)
LDL Cholesterol: 156 mg/dL — ABNORMAL HIGH (ref 0–99)
NonHDL: 174.93
Total CHOL/HDL Ratio: 4
Triglycerides: 95 mg/dL (ref 0.0–149.0)
VLDL: 19 mg/dL (ref 0.0–40.0)

## 2023-10-25 LAB — HEMOGLOBIN A1C: Hgb A1c MFr Bld: 6.1 % (ref 4.6–6.5)

## 2023-10-25 LAB — COMPREHENSIVE METABOLIC PANEL
ALT: 16 U/L (ref 0–35)
AST: 18 U/L (ref 0–37)
Albumin: 4.2 g/dL (ref 3.5–5.2)
Alkaline Phosphatase: 66 U/L (ref 39–117)
BUN: 11 mg/dL (ref 6–23)
CO2: 28 meq/L (ref 19–32)
Calcium: 9.1 mg/dL (ref 8.4–10.5)
Chloride: 103 meq/L (ref 96–112)
Creatinine, Ser: 0.85 mg/dL (ref 0.40–1.20)
GFR: 79.04 mL/min (ref >60.00)
Glucose, Bld: 83 mg/dL (ref 70–99)
Potassium: 4.4 meq/L (ref 3.5–5.1)
Sodium: 138 meq/L (ref 135–145)
Total Bilirubin: 0.6 mg/dL (ref 0.2–1.2)
Total Protein: 7.2 g/dL (ref 6.0–8.3)

## 2023-10-25 MED ORDER — FLUOXETINE HCL 10 MG PO TABS: ORAL_TABLET | ORAL | 3 refills | Status: DC

## 2023-10-25 MED ORDER — METOPROLOL SUCCINATE ER 25 MG PO TB24: ORAL_TABLET | ORAL | 3 refills | Status: DC

## 2023-10-25 MED ORDER — TRIAMCINOLONE ACETONIDE 0.5 % EX OINT: 1.0000 | TOPICAL_OINTMENT | Freq: Two times a day (BID) | CUTANEOUS | 1 refills | Status: DC | PRN

## 2023-10-25 NOTE — Patient Instructions (Addendum)
Continue Zyrtec, avoid Dial soap for now.  Fragrance free, allergy free detergent may be helpful.  Aveeno lotion can be used throughout the day to help with itching, or triamcinolone cream until you are seen by dermatology.  Please call them for an appointment.  Let me know if they need a referral.  Should not be necessary if you are a current patient.  Slight firm area near the C-section scar but I do not feel any concerning lumps or bumps at this time.  Glad the previous area has improved.  Follow-up if any worsening symptoms and can discuss that with dermatology as well.  I will check labs, no med changes at this time.  Take care!

## 2023-10-25 NOTE — Progress Notes (Signed)
Subjective:  Patient ID: [name], female    DOB: August 14, 1971  Age: 52 y.o. MRN: [id number]  CC:  Chief Complaint  Patient presents with   Mass    Pt notes lump on her right side near to her C section scar just above, notes saw Gynecology did [place] and deemed this was out of her scope. Pt notes now lump seems to be mostly gone, noticed first 2 weeks ago in the shower, saw GYN 2 days later after noticing, then dissipated about a week ago just wants to be checked out make sure it doesn't return     Rash    Pt notes she always has a rash across her shoulders arms and chest, pt notes itchy, red, skin is not flaky, pt notes no pustules     HPI [name] presents for   2 concerns above.  Lump above C-section scar Initially evaluated by gynecology.  Had ultrasound -unable to see this through [facility]  About a centimeter. Ovaries were ok. Unknown cause. Area has improved. Firm initially, now improved.  Initially had noticed it a few weeks ago in the shower.  Again it is dissipated at this time. Has been doing more strength training, heavy lifting, stretch lab.   Rash Shoulders arms and chest.  Itchy and red, come in small patches. Notes if more sweating or wearing a hoodie. Alterations in weather seemed to bring this on, or sweating. Patch on R leg as well - few months. Has seen derm for keloid injection. Last visit in 5-6 months ago. No upcoming appt.   Now on Cerave cleanser, on Dial soap prior.  Fosters cats.  No new derm products.  Tide detergent, fabric softener beads. No new products.  On zyrtec every day.  Tx: hydrocortisone BID, aloe.   Hypertension: With history of PVCs, mitral regurgitation.  On Toprol-XL 25 mg daily.  Denies recent palpitations, or chest pain.  Blood pressure has been stable. Home readings: BP Readings from Last 3 Encounters:  10/25/23 122/70  01/06/23 118/70  03/09/22 126/78   Lab Results  Component Value Date   CREATININE 0.88 01/08/2023     Anxiety Prozac 20 mg daily with rare need for Xanax with stressors, few per month when discussed in January. Now approx 1/month.      10/25/2023    8:35 AM 09/08/2021   10:44 AM 04/05/2019    9:01 AM  GAD 7 : Generalized Anxiety Score  Nervous, Anxious, on Edge 0 1 2  Control/stop worrying 0 1 1  Worry too much - different things 0 1 1  Trouble relaxing 0 1 2  Restless 0 1 1  Easily annoyed or irritable 0 2 0  Afraid - awful might happen 0 1 1  Total GAD 7 Score 0 8 8  Anxiety Difficulty   Somewhat difficult    Hyperlipidemia: Elevated previously but low ASCVD risk score.  No current statin. The 10-year ASCVD risk score (Arnett DK, et al., 2019) is: 3.3%   Values used to calculate the score:     Age: 22 years     Sex: Female     Is Non-Hispanic African American: Yes     Diabetic: No     Tobacco smoker: No     Systolic Blood Pressure: 122 mmHg     Is BP treated: Yes     HDL Cholesterol: 60.8 mg/dL     Total Cholesterol: 245 mg/dL  Lab Results  Component Value Date  CHOL 245 (H) 01/08/2023   HDL 60.80 01/08/2023   LDLCALC 170 (H) 01/08/2023   LDLDIRECT 161.0 06/05/2013   TRIG 74.0 01/08/2023   CHOLHDL 4 01/08/2023   Lab Results  Component Value Date   ALT 11 01/08/2023   AST 14 01/08/2023   ALKPHOS 69 01/08/2023   BILITOT 0.9 01/08/2023   Prediabetes: Weight stable, diet/exercise approach. On Osteo strong, stretch lab.  Lab Results  Component Value Date   HGBA1C 5.9 01/08/2023   Wt Readings from Last 3 Encounters:  10/25/23 190 lb 9.6 oz (86.5 kg)  01/06/23 189 lb 3.2 oz (85.8 kg)  03/09/22 179 lb (81.2 kg)          History Patient Active Problem List   Diagnosis Date Noted   Uterine fibroid 10/03/2019   Status post total hysterectomy 10/03/2019   Breast cancer screening 06/06/2019   Visit for preventive health examination 01/21/2018   Skin cysts, generalized 01/21/2018   Vitamin D deficiency 12/23/2016   Vitamin B12 deficiency  12/23/2016   Fibrocystic breast disease 12/23/2016   Seasonal allergic rhinitis 06/05/2013   Mitral regurgitation 06/02/2011   Hyperlipidemia 11/14/2008   Hypertension 07/13/2008   GILBERT'S SYNDROME 06/22/2007   Anxiety state 06/22/2007   PVC (premature ventricular contraction) 06/22/2007   Past Medical History:  Diagnosis Date   Anxiety    Gilbert syndrome    Heart murmur    Dr. Clide Cliff history of heart murmur   Herpes exposure    Type 1 & 2 ; Dr Cherly Hensen, Gyn   Hyperlipidemia    Hypertension    Iron deficiency anemia    Pre-diabetes    Premature ventricular contractions    Skin rash    waist area comes and goes   Vitamin B 12 deficiency    Vitamin D deficiency    History of, medication stopped because Vitamin d became too hogh   Past Surgical History:  Procedure Laterality Date   ABDOMINAL HYSTERECTOMY  09/2019   CESAREAN SECTION     X 2, Dr Cherly Hensen   IR RADIOLOGIST EVAL & MGMT  08/29/2019   ROBOTIC ASSISTED LAPAROSCOPIC HYSTERECTOMY AND SALPINGECTOMY Bilateral 10/03/2019   Procedure: XI ROBOTIC ASSISTED LAPAROSCOPIC HYSTERECTOMY AND SALPINGECTOMY;  Surgeon: Maxie Better, MD;  Location: [facility];  Service: Gynecology;  Laterality: Bilateral;   TOTAL ABDOMINAL HYSTERECTOMY     TUBAL LIGATION     WISDOM TOOTH EXTRACTION     Allergies  Allergen Reactions   Cephalexin      03/2012 Dyspnea   Other     Grapefruit due to Fluoxetine   Penicillins     Unknown reaction as child   Prior to Admission medications   Medication Sig Start Date End Date Taking? Authorizing Provider  ALPRAZolam (XANAX) 0.25 MG tablet Take 1 tablet (0.25 mg total) by mouth 2 (two) times daily as needed for anxiety. 04/09/23  Yes Shade Flood, MD  Cholecalciferol (VITAMIN D3) 10000 units TABS Take 1 tablet by mouth daily. 12/23/16  Yes Burns, Bobette Mo, MD  clobetasol ointment (TEMOVATE) 0.05 % APPLY TOPICALLY TO THE AFFECTED AREA TWICE DAILY 11/24/21  Yes Sheffield, [place] R,  PA-C  clotrimazole-betamethasone (LOTRISONE) cream Apply 1 application topically daily as needed (rash).   Yes [provider]  FLUoxetine (PROZAC) 20 MG tablet TAKE 1 TABLET BY MOUTH DAILY Patient taking differently: Take 10 mg by mouth daily. TAKE 1 TABLET BY MOUTH DAILY 01/06/23  Yes Shade Flood, MD  fluticasone [facility]) 50 MCG/ACT  nasal spray SHAKE LIQUID AND USE 2 SPRAYS IN EACH NOSTRIL DAILY 09/03/23  Yes Hyman Hopes B, NP  metoprolol succinate (TOPROL-XL) 25 MG 24 hr tablet TAKE 1 TABLET(25 MG) BY MOUTH DAILY 01/06/23  Yes Shade Flood, MD  Multiple Vitamin (MULTIVITAMIN) tablet Take 1 tablet by mouth daily.   Yes [provider]  ammonium lactate (AMLACTIN DAILY) 12 % lotion 1 application Externally every day (qd) for 30 days Patient not taking: Reported on 10/25/2023 11/06/19   [provider]  fluconazole (DIFLUCAN) 150 MG tablet     [provider]  nystatin-triamcinolone ointment (MYCOLOG)     [provider]  terconazole (TERAZOL 7) 0.4 % vaginal cream     [provider]  triamcinolone ointment (KENALOG) 0.5 % Apply 1 application topically 2 (two) times daily as needed (rash). Patient not taking: Reported on 10/25/2023 09/08/21   Shade Flood, MD   Social History   Socioeconomic History   Marital status: Married    Spouse name: Not on file   Number of children: Not on file   Years of education: Not on file   Highest education level: Not on file  Occupational History   Not on file  Tobacco Use   Smoking status: Never   Smokeless tobacco: Never  Vaping Use   Vaping status: Never Used  Substance and Sexual Activity   Alcohol use: No   Drug use: No   Sexual activity: Yes    Birth control/protection: Surgical  Other Topics Concern   Not on file  Social History Narrative   Not on file   Social Determinants of Health   Financial Resource Strain: Not on file  Food Insecurity: Not on file   Transportation Needs: Not on file  Physical Activity: Not on file  Stress: Not on file  Social Connections: Not on file  Intimate Partner Violence: Not on file    Review of Systems  Constitutional:  Negative for fatigue and unexpected weight change.  Respiratory:  Negative for chest tightness and shortness of breath.   Cardiovascular:  Negative for chest pain, palpitations and leg swelling.  Gastrointestinal:  Negative for abdominal pain and blood in stool.  Neurological:  Negative for dizziness, syncope, light-headedness and headaches.     Objective:   Vitals:   10/25/23 0837  BP: 122/70  Pulse: 62  Temp: 98.4 F (36.9 C)  TempSrc: Temporal  SpO2: 100%  Weight: 190 lb 9.6 oz (86.5 kg)  Height: 5' 7.5" (1.715 m)     Physical Exam Vitals reviewed. Exam conducted with a chaperone present Thea Silversmith).  Constitutional:      Appearance: Normal appearance. She is well-developed.  HENT:     Head: Normocephalic and atraumatic.  Eyes:     Conjunctiva/sclera: Conjunctivae normal.     Pupils: Pupils are equal, round, and reactive to light.  Neck:     Vascular: No carotid bruit.  Cardiovascular:     Rate and Rhythm: Normal rate and regular rhythm.     Heart sounds: Normal heart sounds.  Pulmonary:     Effort: Pulmonary effort is normal.     Breath sounds: Normal breath sounds.  Abdominal:     Palpations: Abdomen is soft. There is no pulsatile mass.     Tenderness: There is no abdominal tenderness.    Musculoskeletal:     Right lower leg: No edema.     Left lower leg: No edema.  Skin:    General: Skin is warm  and dry.     Findings: Rash (Few patches of erythema along the lateral neck, upper chest, right lower leg.  See photos.  No induration.  No vesicles.) present.  Neurological:     Mental Status: She is alert and oriented to person, place, and time.  Psychiatric:        Mood and Affect: Mood normal.        Behavior: Behavior normal.             Assessment & Plan:  [name] is a 52 y.o. female . Rash and nonspecific skin eruption  -Does not have typical appearance of tinea versicolor, and would not be present on the leg.  With itching and appearance suspicious for contact dermatitis of some type.  Hydrating lotion like Aveeno discussed, allergy free detergent, caution with any dryer additives or fabric softener for now.  Avoid Dial soap for now.  Triamcinolone topical as needed and recommended follow-up with her dermatologist.  Abdominal wall lump  -Initially noted few weeks ago, larger at that time, has improved in size.  Minimal firm area at C-section scar.  Reportedly had ultrasound without concerns.  Stretching, weight lifting potentially could have had pulled muscle, now improving.  Continue to monitor and can follow-up with dermatology as well.  RTC precautions if increasing size or new symptoms.  Essential hypertension - Plan: metoprolol succinate (TOPROL-XL) 25 MG 24 hr tablet, Comprehensive metabolic panel  -  Stable, tolerating current regimen. Medications refilled. Labs pending as above.    Hyperlipidemia, unspecified hyperlipidemia type - Plan: Comprehensive metabolic panel, Lipid panel, Hemoglobin A1c   Stable, tolerating current regimen. Medications refilled. Labs pending as above.   Prediabetes - Plan: Hemoglobin A1c  -Continue diet/exercise approach, check A1c  Anxiety state - Plan: FLUoxetine (PROZAC) 10 MG tablet  -Stable with current regimen, continue Prozac same dose.  Has Xanax if needed for breakthrough symptoms, rare need.  Meds ordered this encounter  Medications   metoprolol succinate (TOPROL-XL) 25 MG 24 hr tablet    Sig: TAKE 1 TABLET(25 MG) BY MOUTH DAILY    Dispense:  90 tablet    Refill:  3   FLUoxetine (PROZAC) 10 MG tablet    Sig: TAKE 1 TABLET BY MOUTH DAILY    Dispense:  90 tablet    Refill:  3   Patient Instructions  Continue Zyrtec, avoid Dial soap for now.   Fragrance free, allergy free detergent may be helpful.  Aveeno lotion can be used throughout the day to help with itching, or triamcinolone cream until you are seen by dermatology.  Slight firm area near the C-section scar but I do not feel any concerning lumps or bumps at this time.  Glad the previous area has improved.  Follow-up if any worsening symptoms and can discuss that with dermatology as well.  I will check labs, no med changes at this time.  Take care!    Signed,   Meredith Staggers, MD  Primary Care, [facility] 10/25/23 9:11 AM

## 2023-10-28 ENCOUNTER — Telehealth: Payer: Self-pay

## 2023-10-28 NOTE — Telephone Encounter (Signed)
-----   Message from Shade Flood sent at 10/28/2023 11:45 AM EST -----  Results sent by MyChart but please make sure she has received them as no recent login.

## 2023-10-29 ENCOUNTER — Other Ambulatory Visit: Payer: Self-pay | Admitting: Family Medicine

## 2023-10-29 NOTE — Telephone Encounter (Signed)
Lab result note:  Cholesterol elevated but slightly improved from 9 months ago.  With a low 10-year heart disease risk score, no new medications are recommended at this time.  Electrolytes, kidney, liver tests overall looked okay.  57-month blood sugar test still at prediabetes range but stable.  Let me know if there are questions.   Dr. Neva Seat  Patient has been informed and did not have any questions

## 2023-11-20 ENCOUNTER — Other Ambulatory Visit: Payer: Self-pay | Admitting: Family Medicine

## 2023-11-20 DIAGNOSIS — L249 Irritant contact dermatitis, unspecified cause: Secondary | ICD-10-CM

## 2023-11-20 DIAGNOSIS — R21 Rash and other nonspecific skin eruption: Secondary | ICD-10-CM

## 2023-12-29 ENCOUNTER — Other Ambulatory Visit: Payer: Self-pay | Admitting: Family Medicine

## 2024-01-29 ENCOUNTER — Other Ambulatory Visit: Payer: Self-pay | Admitting: Family Medicine

## 2024-01-29 DIAGNOSIS — I10 Essential (primary) hypertension: Secondary | ICD-10-CM

## 2024-02-29 ENCOUNTER — Other Ambulatory Visit: Payer: Self-pay | Admitting: Family Medicine

## 2024-05-01 ENCOUNTER — Ambulatory Visit (INDEPENDENT_AMBULATORY_CARE_PROVIDER_SITE_OTHER): Payer: 59 | Admitting: Family Medicine

## 2024-05-01 ENCOUNTER — Encounter: Payer: Self-pay | Admitting: Family Medicine

## 2024-05-01 VITALS — BP 124/66 | HR 63 | Temp 98.2°F | Ht 67.75 in | Wt 185.2 lb

## 2024-05-01 DIAGNOSIS — I10 Essential (primary) hypertension: Secondary | ICD-10-CM

## 2024-05-01 DIAGNOSIS — R7303 Prediabetes: Secondary | ICD-10-CM | POA: Diagnosis not present

## 2024-05-01 DIAGNOSIS — R21 Rash and other nonspecific skin eruption: Secondary | ICD-10-CM

## 2024-05-01 DIAGNOSIS — L249 Irritant contact dermatitis, unspecified cause: Secondary | ICD-10-CM | POA: Diagnosis not present

## 2024-05-01 DIAGNOSIS — Z0000 Encounter for general adult medical examination without abnormal findings: Secondary | ICD-10-CM

## 2024-05-01 DIAGNOSIS — F411 Generalized anxiety disorder: Secondary | ICD-10-CM | POA: Diagnosis not present

## 2024-05-01 DIAGNOSIS — E785 Hyperlipidemia, unspecified: Secondary | ICD-10-CM

## 2024-05-01 DIAGNOSIS — L810 Postinflammatory hyperpigmentation: Secondary | ICD-10-CM | POA: Insufficient documentation

## 2024-05-01 DIAGNOSIS — L910 Hypertrophic scar: Secondary | ICD-10-CM | POA: Insufficient documentation

## 2024-05-01 LAB — LIPID PANEL
Cholesterol: 250 mg/dL — ABNORMAL HIGH (ref 0–200)
HDL: 64.3 mg/dL (ref >39.00)
LDL Cholesterol: 168 mg/dL — ABNORMAL HIGH (ref 0–99)
NonHDL: 186.03
Total CHOL/HDL Ratio: 4
Triglycerides: 91 mg/dL (ref 0.0–149.0)
VLDL: 18.2 mg/dL (ref 0.0–40.0)

## 2024-05-01 LAB — COMPREHENSIVE METABOLIC PANEL WITH GFR
ALT: 14 U/L (ref 0–35)
AST: 17 U/L (ref 0–37)
Albumin: 4.4 g/dL (ref 3.5–5.2)
Alkaline Phosphatase: 61 U/L (ref 39–117)
BUN: 11 mg/dL (ref 6–23)
CO2: 25 meq/L (ref 19–32)
Calcium: 8.9 mg/dL (ref 8.4–10.5)
Chloride: 103 meq/L (ref 96–112)
Creatinine, Ser: 0.83 mg/dL (ref 0.40–1.20)
GFR: 81.03 mL/min (ref >60.00)
Glucose, Bld: 81 mg/dL (ref 70–99)
Potassium: 4.1 meq/L (ref 3.5–5.1)
Sodium: 137 meq/L (ref 135–145)
Total Bilirubin: 0.7 mg/dL (ref 0.2–1.2)
Total Protein: 7.3 g/dL (ref 6.0–8.3)

## 2024-05-01 LAB — HEMOGLOBIN A1C: Hgb A1c MFr Bld: 5.9 % (ref 4.6–6.5)

## 2024-05-01 MED ORDER — FLUOXETINE HCL 10 MG PO TABS: ORAL_TABLET | ORAL | 3 refills | Status: AC

## 2024-05-01 MED ORDER — METOPROLOL SUCCINATE ER 25 MG PO TB24: ORAL_TABLET | ORAL | 3 refills | Status: AC

## 2024-05-01 MED ORDER — ALPRAZOLAM 0.25 MG PO TABS: 0.2500 mg | ORAL_TABLET | Freq: Two times a day (BID) | ORAL | 0 refills | Status: AC | PRN

## 2024-05-01 MED ORDER — TRIAMCINOLONE ACETONIDE 0.5 % EX OINT: TOPICAL_OINTMENT | Freq: Two times a day (BID) | CUTANEOUS | 1 refills | Status: AC | PRN

## 2024-05-01 NOTE — Patient Instructions (Addendum)
 Thank you for coming in today.  Throat irritation could be from either the allergies and postnasal drip or irritation from the nasal spray into the throat.  For now lets try to avoid sniffing of the nasal spray after use to see if that lessens the irritation.  If not, let me know.  Intermittent rash could be a component of eczema or dermatitis, could be contact dermatitis or irritation from something in the garden.  Can be difficult sometimes to identify exact cause of rash.  I did refill the triamcinolone  for now if needed, up to twice per day, but I do recommend calling your dermatologist for follow-up as well.  Schedule mammogram when possible.  If any concerns on labs I will let you know but no medication changes for now.  Congratulations on the weight loss, I think that will help with the prediabetes test as well as cholesterol levels.  No new meds for now.  Keep up the good work and take care!  Preventive Care 53-24 Years Old, Female Preventive care refers to lifestyle choices and visits with your health care provider that can promote health and wellness. Preventive care visits are also called wellness exams. What can I expect for my preventive care visit? Counseling Your health care provider may ask you questions about your: Medical history, including: Past medical problems. Family medical history. Pregnancy history. Current health, including: Menstrual cycle. Method of birth control. Emotional well-being. Home life and relationship well-being. Sexual activity and sexual health. Lifestyle, including: Alcohol, nicotine or tobacco, and drug use. Access to firearms. Diet, exercise, and sleep habits. Work and work Astronomer. Sunscreen use. Safety issues such as seatbelt and bike helmet use. Physical exam Your health care provider will check your: Height and weight. These may be used to calculate your BMI (body mass index). BMI is a measurement that tells if you are at a healthy  weight. Waist circumference. This measures the distance around your waistline. This measurement also tells if you are at a healthy weight and may help predict your risk of certain diseases, such as type 2 diabetes and high blood pressure. Heart rate and blood pressure. Body temperature. Skin for abnormal spots. What immunizations do I need?  Vaccines are usually given at various ages, according to a schedule. Your health care provider will recommend vaccines for you based on your age, medical history, and lifestyle or other factors, such as travel or where you work. What tests do I need? Screening Your health care provider may recommend screening tests for certain conditions. This may include: Lipid and cholesterol levels. Diabetes screening. This is done by checking your blood sugar (glucose) after you have not eaten for a while (fasting). Pelvic exam and Pap test. Hepatitis B test. Hepatitis C test. HIV (human immunodeficiency virus) test. STI (sexually transmitted infection) testing, if you are at risk. Lung cancer screening. Colorectal cancer screening. Mammogram. Talk with your health care provider about when you should start having regular mammograms. This may depend on whether you have a family history of breast cancer. BRCA-related cancer screening. This may be done if you have a family history of breast, ovarian, tubal, or peritoneal cancers. Bone density scan. This is done to screen for osteoporosis. Talk with your health care provider about your test results, treatment options, and if necessary, the need for more tests. Follow these instructions at home: Eating and drinking  Eat a diet that includes fresh fruits and vegetables, whole grains, lean protein, and low-fat dairy products. Take vitamin  and mineral supplements as recommended by your health care provider. Do not drink alcohol if: Your health care provider tells you not to drink. You are pregnant, may be pregnant, or  are planning to become pregnant. If you drink alcohol: Limit how much you have to 0-1 drink a day. Know how much alcohol is in your drink. In the [place] one drink equals one 12 oz bottle of beer (355 mL), one 5 oz glass of wine (148 mL), or one 1 oz glass of hard liquor (44 mL). Lifestyle Brush your teeth every morning and night with fluoride toothpaste. Floss one time each day. Exercise for at least 30 minutes 5 or more days each week. Do not use any products that contain nicotine or tobacco. These products include cigarettes, chewing tobacco, and vaping devices, such as e-cigarettes. If you need help quitting, ask your health care provider. Do not use drugs. If you are sexually active, practice safe sex. Use a condom or other form of protection to prevent STIs. If you do not wish to become pregnant, use a form of birth control. If you plan to become pregnant, see your health care provider for a prepregnancy visit. Take aspirin only as told by your health care provider. Make sure that you understand how much to take and what form to take. Work with your health care provider to find out whether it is safe and beneficial for you to take aspirin daily. Find healthy ways to manage stress, such as: Meditation, yoga, or listening to music. Journaling. Talking to a trusted person. Spending time with friends and family. Minimize exposure to UV radiation to reduce your risk of skin cancer. Safety Always wear your seat belt while driving or riding in a vehicle. Do not drive: If you have been drinking alcohol. Do not ride with someone who has been drinking. When you are tired or distracted. While texting. If you have been using any mind-altering substances or drugs. Wear a helmet and other protective equipment during sports activities. If you have firearms in your house, make sure you follow all gun safety procedures. Seek help if you have been physically or sexually abused. What's next? Visit  your health care provider once a year for an annual wellness visit. Ask your health care provider how often you should have your eyes and teeth checked. Stay up to date on all vaccines. This information is not intended to replace advice given to you by your health care provider. Make sure you discuss any questions you have with your health care provider. Document Revised: 06/04/2021 Document Reviewed: 06/04/2021 Elsevier Patient Education  2024 [employer].

## 2024-05-01 NOTE — Progress Notes (Signed)
 Subjective:  Patient ID: [name], female    DOB: Jun 28, 1971  Age: 53 y.o. MRN: [id number]  CC:  Chief Complaint  Patient presents with   Annual Exam    Pt is doing well, would like to know when and how long to use nasal spray Flonase , notes she is also still dealing with skin rash so would like refills on the medication for that, pt is fasting     HPI [name] presents for Annual Exam  No health changes.   PCP, me Functional medicine provider, Dr. Lauralee Poll, following vitamin D  with supplementation - once per year - appt IN October. Stable last October at 49.  Derm: in [facility]. Keloid treatment. Cream for skin for itching areas - dermatitis. Has used triamcinolone  daily for about 4 das, clears up, then returns 3-4 days later. Sometimes 1 month off. Itching area arms, legs, hips. No oral/genital lesions. No recent derm eval. Worse past few months. Itching with sweats, or sweating at night with postmenopausal symptoms.  Some work in garden and cats outside.  OBGYN - Dr. Lesta Rater, recent visit with pap testing - normal.   Allergic Rhinitis:  Uses flonase  daily in am, does sniff afterwards. Some irritation in throat at times, then resolves. NS does help - no breakthrough congestion.  Hyperlipidemia: Red yeast rice supplement previously, low ASCVD risk score. No red yeast rice. Has been trying to change diet - less carbs, fried foods, chips, sweets. Healthier snacking. Down 5#.  No FH of early CAD Wt Readings from Last 3 Encounters:  05/01/24 185 lb 3.2 oz (84 kg)  10/25/23 190 lb 9.6 oz (86.5 kg)  01/06/23 189 lb 3.2 oz (85.8 kg)   The 10-year ASCVD risk score (Arnett DK, et al., 2019) is: 3.6%   Values used to calculate the score:     Age: 66 years     Sex: Female     Is Non-Hispanic African American: Yes     Diabetic: No     Tobacco smoker: No     Systolic Blood Pressure: 124 mmHg     Is BP treated: Yes     HDL Cholesterol: 62 mg/dL     Total  Cholesterol: 237 mg/dL  Lab Results  Component Value Date   CHOL 237 (H) 10/25/2023   HDL 62.00 10/25/2023   LDLCALC 156 (H) 10/25/2023   LDLDIRECT 161.0 06/05/2013   TRIG 95.0 10/25/2023   CHOLHDL 4 10/25/2023   Lab Results  Component Value Date   ALT 16 10/25/2023   AST 18 10/25/2023   ALKPHOS 66 10/25/2023   BILITOT 0.6 10/25/2023    Prediabetes: Diet/exercise approach, weight has improved by 5 pounds since November, diet has improved as above.  Lab Results  Component Value Date   HGBA1C 6.1 10/25/2023   Wt Readings from Last 3 Encounters:  05/01/24 185 lb 3.2 oz (84 kg)  10/25/23 190 lb 9.6 oz (86.5 kg)  01/06/23 189 lb 3.2 oz (85.8 kg)   Hypertension: With history of PVCs and mitral regurgitation, Toprol -XL 25 mg daily for hypertension treatment.  Denies recent chest pain or palpitations. No side effects.  Home readings: none BP Readings from Last 3 Encounters:  05/01/24 124/66  10/25/23 122/70  01/06/23 118/70   Lab Results  Component Value Date   CREATININE 0.85 10/25/2023   Anxiety Family stressors with special needs child, recently turned 25yo, who has required many surgeries, and total care support.  Treated with  Prozac  10 mg daily with rare need for alprazolam  few times per month when discussed last year.  Only needed during higher stressors.  Controlled substance database reviewed.  Alprazolam  No. 20 last filled on 04/09/2023, no concerns  Rare need for xanax  - last used a week or two ago.  Feels like stable with prozac  10mg .       05/01/2024    8:51 AM 10/25/2023    8:34 AM 01/06/2023   10:17 AM 03/09/2022    8:15 AM 06/07/2020    8:44 AM  Depression screen PHQ 2/9  Decreased Interest 0 0 0 0 0  Down, Depressed, Hopeless 0 0 0 0 0  PHQ - 2 Score 0 0 0 0 0  Altered sleeping 0 1 0 0   Tired, decreased energy 0 0 0 1   Change in appetite 0 0 0 0   Feeling bad or failure about yourself  0 0 0 0   Trouble concentrating 0 0 0 1   Moving slowly or  fidgety/restless 0 0 0 0   Suicidal thoughts 0 0 0 0   PHQ-9 Score 0 1 0 2       05/01/2024    8:51 AM 10/25/2023    8:35 AM 09/08/2021   10:44 AM 04/05/2019    9:01 AM  GAD 7 : Generalized Anxiety Score  Nervous, Anxious, on Edge 1 0 1 2  Control/stop worrying 0 0 1 1  Worry too much - different things 0 0 1 1  Trouble relaxing 0 0 1 2  Restless 0 0 1 1  Easily annoyed or irritable 1 0 2 0  Afraid - awful might happen 0 0 1 1  Total GAD 7 Score 2 0 8 8  Anxiety Difficulty    Somewhat difficult    Health Maintenance  Topic Date Due   HIV Screening  Never done   Hepatitis C Screening  Never done   Pneumococcal Vaccine 69-43 Years old (1 of 2 - PCV) Never done   MAMMOGRAM  07/12/2021   Zoster Vaccines- Shingrix (1 of 2) Never done   COVID-19 Vaccine (1 - 2024-25 season) Never done   INFLUENZA VACCINE  07/21/2024   DTaP/Tdap/Td (3 - Td or Tdap) 10/02/2027   Colonoscopy  07/08/2031   HPV VACCINES  Aged Out   Meningococcal B Vaccine  Aged Out  Colonoscopy in 2022. Repeat 5 years.  Recent pap with GYN. Plans to schedule mammogram.   Immunization History  Administered Date(s) Administered   Influenza-Unspecified 10/21/2018   Td 04/23/2006   Tdap 10/01/2017  She declines flu, shingles vaccine and HIV/hepatitis C testing Also recommended pneumonia vaccine - declined.   No results found.no change in vision. Readers only. Prior exam by Dr. Candi Chafe - few years ago. No concerns.   Dental: every 6 months.   Alcohol: none  Tobacco: none  Exercise:  stretching exercise daily with APP. Some walking during week.    History Patient Active Problem List   Diagnosis Date Noted   Keloid 05/01/2024   Post-inflammatory hyperpigmentation 05/01/2024   Uterine fibroid 10/03/2019   Status post total hysterectomy 10/03/2019   Breast cancer screening 06/06/2019   Visit for preventive health examination 01/21/2018   Skin cysts, generalized 01/21/2018   Vitamin D  deficiency 12/23/2016    Vitamin B12 deficiency 12/23/2016   Fibrocystic breast disease 12/23/2016   Seasonal allergic rhinitis 06/05/2013   Mitral regurgitation 06/02/2011   Hyperlipidemia 11/14/2008   Hypertension 07/13/2008  GILBERT'S SYNDROME 06/22/2007   Anxiety state 06/22/2007   PVC (premature ventricular contraction) 06/22/2007   Past Medical History:  Diagnosis Date   Anxiety    Gilbert syndrome    Heart murmur    Dr. Aida House history of heart murmur   Herpes exposure    Type 1 & 2 ; Dr Lesta Rater, Gyn   Hyperlipidemia    Hypertension    Iron deficiency anemia    Pre-diabetes    Premature ventricular contractions    Skin rash    waist area comes and goes   Vitamin B 12 deficiency    Vitamin D  deficiency    History of, medication stopped because Vitamin d  became too hogh   Past Surgical History:  Procedure Laterality Date   ABDOMINAL HYSTERECTOMY  09/2019   CESAREAN SECTION     X 2, Dr Lesta Rater   IR RADIOLOGIST EVAL & MGMT  08/29/2019   ROBOTIC ASSISTED LAPAROSCOPIC HYSTERECTOMY AND SALPINGECTOMY Bilateral 10/03/2019   Procedure: XI ROBOTIC ASSISTED LAPAROSCOPIC HYSTERECTOMY AND SALPINGECTOMY;  Surgeon: Ivery Marking, MD;  Location: [facility];  Service: Gynecology;  Laterality: Bilateral;   TOTAL ABDOMINAL HYSTERECTOMY     TUBAL LIGATION     WISDOM TOOTH EXTRACTION     Allergies  Allergen Reactions   Cephalexin      03/2012 Dyspnea   Other     Grapefruit due to Fluoxetine    Penicillins     Unknown reaction as child   Prior to Admission medications   Medication Sig Start Date End Date Taking? Authorizing Provider  ALPRAZolam  (XANAX ) 0.25 MG tablet Take 1 tablet (0.25 mg total) by mouth 2 (two) times daily as needed for anxiety. 04/09/23  Yes Benjiman Bras, MD  Cholecalciferol (VITAMIN D3) 10000 units TABS Take 1 tablet by mouth daily. 12/23/16  Yes Burns, Beckey Bourgeois, MD  clobetasol  ointment (TEMOVATE ) 0.05 % APPLY TOPICALLY TO THE AFFECTED AREA TWICE DAILY 11/24/21   Yes Sheffield, Kelli R, PA-C  clotrimazole -betamethasone  (LOTRISONE ) cream Apply 1 application topically daily as needed (rash).   Yes [provider]  FLUoxetine  (PROZAC ) 10 MG tablet TAKE 1 TABLET BY MOUTH DAILY 10/25/23  Yes Benjiman Bras, MD  fluticasone  (FLONASE ) 50 MCG/ACT nasal spray SHAKE LIQUID AND USE 2 SPRAYS IN EACH NOSTRIL DAILY 12/29/23  Yes Benjiman Bras, MD  metoprolol  succinate (TOPROL -XL) 25 MG 24 hr tablet TAKE 1 TABLET(25 MG) BY MOUTH DAILY 10/25/23  Yes Benjiman Bras, MD  Multiple Vitamin (MULTIVITAMIN) tablet Take 1 tablet by mouth daily.   Yes [provider]  triamcinolone  ointment (KENALOG ) 0.5 % APPLY TOPICALLY TO THE AFFECTED AREA TWICE DAILY AS NEEDED FOR RASH 11/22/23  Yes Benjiman Bras, MD  ammonium lactate (AMLACTIN DAILY) 12 % lotion 1 application Externally every day (qd) for 30 days Patient not taking: Reported on 05/01/2024 11/06/19   [provider]   Social History   Socioeconomic History   Marital status: Married    Spouse name: Not on file   Number of children: Not on file   Years of education: Not on file   Highest education level: Not on file  Occupational History   Not on file  Tobacco Use   Smoking status: Never   Smokeless tobacco: Never  Vaping Use   Vaping status: Never Used  Substance and Sexual Activity   Alcohol use: No   Drug use: No   Sexual activity: Yes    Birth control/protection: Surgical  Other Topics Concern  Not on file  Social History Narrative   Not on file   Social Drivers of Health   Financial Resource Strain: Not on file  Food Insecurity: Not on file  Transportation Needs: Not on file  Physical Activity: Not on file  Stress: Not on file  Social Connections: Not on file  Intimate Partner Violence: Not on file    Review of Systems 13 point review of systems per patient health survey noted.  Negative other than as indicated above or in HPI.    Objective:   Vitals:    05/01/24 0849  BP: 124/66  Pulse: 63  Temp: 98.2 F (36.8 C)  TempSrc: Temporal  SpO2: 99%  Weight: 185 lb 3.2 oz (84 kg)  Height: 5' 7.75" (1.721 m)     Physical Exam Vitals reviewed.  Constitutional:      Appearance: She is well-developed.  HENT:     Head: Normocephalic and atraumatic.     Right Ear: External ear normal.     Left Ear: External ear normal.     Nose: Nose normal. No congestion or rhinorrhea.     Comments: Sinuses nontender    Mouth/Throat:     Mouth: Mucous membranes are moist.     Pharynx: Oropharynx is clear. No oropharyngeal exudate or posterior oropharyngeal erythema.  Eyes:     Conjunctiva/sclera: Conjunctivae normal.     Pupils: Pupils are equal, round, and reactive to light.  Neck:     Thyroid : No thyromegaly.  Cardiovascular:     Rate and Rhythm: Normal rate and regular rhythm.     Heart sounds: Normal heart sounds. No murmur heard. Pulmonary:     Effort: Pulmonary effort is normal. No respiratory distress.     Breath sounds: Normal breath sounds. No wheezing.  Abdominal:     General: Bowel sounds are normal.     Palpations: Abdomen is soft.     Tenderness: There is no abdominal tenderness.  Musculoskeletal:        General: No tenderness. Normal range of motion.     Cervical back: Normal range of motion and neck supple.  Lymphadenopathy:     Cervical: No cervical adenopathy.  Skin:    General: Skin is warm and dry.     Findings: Rash (Few slightly excoriated patches on right greater than left forearm, no urticarial lesions, no surrounding erythema.) present.  Neurological:     Mental Status: She is alert and oriented to person, place, and time.  Psychiatric:        Behavior: Behavior normal.        Thought Content: Thought content normal.      Assessment & Plan:  [name] is a 53 y.o. female . Annual physical exam  - -anticipatory guidance as below in AVS, screening labs above. Health maintenance items as above in HPI  discussed/recommended as applicable.   - Discussed avoiding sniffing after use of Flonase  to see if that may lessen the irritation in her throat.  Could still have some postnasal drip that may also be worsening symptoms but unlikely given relief of nasal congestion with Flonase .  RTC precautions.  Rash and nonspecific skin eruption - Plan: triamcinolone  ointment (KENALOG ) 0.5 % Irritant contact dermatitis, unspecified trigger - Plan: triamcinolone  ointment (KENALOG ) 0.5 %  - Possible eczematous or nonspecific dermatitis versus contact dermatitis.  Recommended follow-up with dermatology, refill triamcinolone  for now.  Anxiety state - Plan: ALPRAZolam  (XANAX ) 0.25 MG tablet, FLUoxetine  (PROZAC ) 10 MG tablet  -  Overall stable, continue same dose fluoxetine  with breakthrough alprazolam  if needed.  Essential hypertension - Plan: metoprolol  succinate (TOPROL -XL) 25 MG 24 hr tablet  - Stable on current dose Toprol , continue same, check labs and adjust plan accordingly.  Prediabetes - Plan: Hemoglobin A1c Hyperlipidemia, unspecified hyperlipidemia type - Plan: Comprehensive metabolic panel with GFR, Lipid panel  - Commended on weight loss, dietary changes and exercise.  Check A1c, lipid panel, no new meds for now.   Meds ordered this encounter  Medications   triamcinolone  ointment (KENALOG ) 0.5 %    Sig: Apply topically 2 (two) times daily as needed.    Dispense:  15 g    Refill:  1   ALPRAZolam  (XANAX ) 0.25 MG tablet    Sig: Take 1 tablet (0.25 mg total) by mouth 2 (two) times daily as needed for anxiety.    Dispense:  20 tablet    Refill:  0   FLUoxetine  (PROZAC ) 10 MG tablet    Sig: TAKE 1 TABLET BY MOUTH DAILY    Dispense:  90 tablet    Refill:  3   metoprolol  succinate (TOPROL -XL) 25 MG 24 hr tablet    Sig: TAKE 1 TABLET(25 MG) BY MOUTH DAILY    Dispense:  90 tablet    Refill:  3   Patient Instructions  Thank you for coming in today.  Throat irritation could be from either the  allergies and postnasal drip or irritation from the nasal spray into the throat.  For now lets try to avoid sniffing of the nasal spray after use to see if that lessens the irritation.  If not, let me know.  Intermittent rash could be a component of eczema or dermatitis, could be contact dermatitis or irritation from something in the garden.  Can be difficult sometimes to identify exact cause of rash.  I did refill the triamcinolone  for now if needed, up to twice per day, but I do recommend calling your dermatologist for follow-up as well.  Schedule mammogram when possible.  If any concerns on labs I will let you know but no medication changes for now.  Congratulations on the weight loss, I think that will help with the prediabetes test as well as cholesterol levels.  No new meds for now.  Keep up the good work and take care!  Preventive Care 63-92 Years Old, Female Preventive care refers to lifestyle choices and visits with your health care provider that can promote health and wellness. Preventive care visits are also called wellness exams. What can I expect for my preventive care visit? Counseling Your health care provider may ask you questions about your: Medical history, including: Past medical problems. Family medical history. Pregnancy history. Current health, including: Menstrual cycle. Method of birth control. Emotional well-being. Home life and relationship well-being. Sexual activity and sexual health. Lifestyle, including: Alcohol, nicotine or tobacco, and drug use. Access to firearms. Diet, exercise, and sleep habits. Work and work Astronomer. Sunscreen use. Safety issues such as seatbelt and bike helmet use. Physical exam Your health care provider will check your: Height and weight. These may be used to calculate your BMI (body mass index). BMI is a measurement that tells if you are at a healthy weight. Waist circumference. This measures the distance around your  waistline. This measurement also tells if you are at a healthy weight and may help predict your risk of certain diseases, such as type 2 diabetes and high blood pressure. Heart rate and blood pressure. Body temperature. Skin for  abnormal spots. What immunizations do I need?  Vaccines are usually given at various ages, according to a schedule. Your health care provider will recommend vaccines for you based on your age, medical history, and lifestyle or other factors, such as travel or where you work. What tests do I need? Screening Your health care provider may recommend screening tests for certain conditions. This may include: Lipid and cholesterol levels. Diabetes screening. This is done by checking your blood sugar (glucose) after you have not eaten for a while (fasting). Pelvic exam and Pap test. Hepatitis B test. Hepatitis C test. HIV (human immunodeficiency virus) test. STI (sexually transmitted infection) testing, if you are at risk. Lung cancer screening. Colorectal cancer screening. Mammogram. Talk with your health care provider about when you should start having regular mammograms. This may depend on whether you have a family history of breast cancer. BRCA-related cancer screening. This may be done if you have a family history of breast, ovarian, tubal, or peritoneal cancers. Bone density scan. This is done to screen for osteoporosis. Talk with your health care provider about your test results, treatment options, and if necessary, the need for more tests. Follow these instructions at home: Eating and drinking  Eat a diet that includes fresh fruits and vegetables, whole grains, lean protein, and low-fat dairy products. Take vitamin and mineral supplements as recommended by your health care provider. Do not drink alcohol if: Your health care provider tells you not to drink. You are pregnant, may be pregnant, or are planning to become pregnant. If you drink alcohol: Limit how  much you have to 0-1 drink a day. Know how much alcohol is in your drink. In the [place] one drink equals one 12 oz bottle of beer (355 mL), one 5 oz glass of wine (148 mL), or one 1 oz glass of hard liquor (44 mL). Lifestyle Brush your teeth every morning and night with fluoride toothpaste. Floss one time each day. Exercise for at least 30 minutes 5 or more days each week. Do not use any products that contain nicotine or tobacco. These products include cigarettes, chewing tobacco, and vaping devices, such as e-cigarettes. If you need help quitting, ask your health care provider. Do not use drugs. If you are sexually active, practice safe sex. Use a condom or other form of protection to prevent STIs. If you do not wish to become pregnant, use a form of birth control. If you plan to become pregnant, see your health care provider for a prepregnancy visit. Take aspirin only as told by your health care provider. Make sure that you understand how much to take and what form to take. Work with your health care provider to find out whether it is safe and beneficial for you to take aspirin daily. Find healthy ways to manage stress, such as: Meditation, yoga, or listening to music. Journaling. Talking to a trusted person. Spending time with friends and family. Minimize exposure to UV radiation to reduce your risk of skin cancer. Safety Always wear your seat belt while driving or riding in a vehicle. Do not drive: If you have been drinking alcohol. Do not ride with someone who has been drinking. When you are tired or distracted. While texting. If you have been using any mind-altering substances or drugs. Wear a helmet and other protective equipment during sports activities. If you have firearms in your house, make sure you follow all gun safety procedures. Seek help if you have been physically or sexually abused. What's next?  Visit your health care provider once a year for an annual wellness  visit. Ask your health care provider how often you should have your eyes and teeth checked. Stay up to date on all vaccines. This information is not intended to replace advice given to you by your health care provider. Make sure you discuss any questions you have with your health care provider. Document Revised: 06/04/2021 Document Reviewed: 06/04/2021 Elsevier Patient Education  2024 Elsevier Inc.          Signed,   Caro Christmas, MD [facility], [facility] 05/01/24 9:50 AM

## 2024-05-06 ENCOUNTER — Ambulatory Visit: Payer: Self-pay | Admitting: Family Medicine

## 2024-06-12 ENCOUNTER — Other Ambulatory Visit: Payer: Self-pay | Admitting: Obstetrics and Gynecology

## 2024-06-12 DIAGNOSIS — Z1231 Encounter for screening mammogram for malignant neoplasm of breast: Secondary | ICD-10-CM

## 2024-06-14 ENCOUNTER — Ambulatory Visit
Admission: RE | Admit: 2024-06-14 | Discharge: 2024-06-14 | Disposition: A | Discharge status: Home / Self Care | Source: Ambulatory Visit | Attending: Obstetrics and Gynecology | Admitting: Obstetrics and Gynecology

## 2024-06-14 DIAGNOSIS — Z1231 Encounter for screening mammogram for malignant neoplasm of breast: Secondary | ICD-10-CM

## 2024-07-12 ENCOUNTER — Ambulatory Visit: Payer: Self-pay

## 2024-07-12 NOTE — Telephone Encounter (Signed)
 FYI Only or Action Required?: FYI only for provider.  Patient was last seen in primary care on 05/01/2024 by Levora Reyes SAUNDERS, MD.  Called Nurse Triage reporting Numbness.  Symptoms began 2 to 3 days.  Interventions attempted: Nothing.  Symptoms are: unchanged.  Triage Disposition: See PCP When Office is Open (Within 3 Days)  Patient/caregiver understands and will follow disposition?: Yes     Copied from CRM #[id number]. Topic: Clinical - Red Word Triage >> Jul 12, 2024  8:46 AM [name] wrote: Red Word that prompted transfer to Nurse Triage: Patient is experiencing numbness and tingling sensation in the left arm for the last 2-3 days. Reason for Disposition  [1] Numbness or tingling in one or both hands AND [2] is a chronic symptom (recurrent or ongoing AND present > 4 weeks)    Left arm has numbness & tingling  Answer Assessment - Initial Assessment Questions 1. SYMPTOM: What is the main symptom you are concerned about? (e.g., weakness, numbness)     Numbness and tingling to left arm 2. ONSET: When did this start? (e.g., minutes, hours, days; while sleeping)     X 2 - 3 day 3. LAST NORMAL: When was the last time you (the patient) were normal (no symptoms)?     na 4. PATTERN Does this come and go, or has it been constant since it started?  Is it present now?     Comes and goes - worse with certain movements 5. CARDIAC SYMPTOMS: Have you had any of the following symptoms: chest pain, difficulty breathing, palpitations?     no 6. NEUROLOGIC SYMPTOMS: Have you had any of the following symptoms: headache, dizziness, vision loss, double vision, changes in speech, unsteady on your feet?     no 7. OTHER SYMPTOMS: Do you have any other symptoms?     no 8. PREGNANCY: Is there any chance you are pregnant? When was your last menstrual period?     na  Protocols used: Neurologic Deficit-A-AH

## 2024-07-12 NOTE — Telephone Encounter (Signed)
 Sending as a FYI. Patient has appointment tomorrow with you

## 2024-07-13 ENCOUNTER — Ambulatory Visit: Admitting: Family Medicine

## 2024-07-13 ENCOUNTER — Encounter: Payer: Self-pay | Admitting: Family Medicine

## 2024-07-13 VITALS — BP 132/74 | HR 75 | Temp 98.3°F | Ht 67.75 in | Wt 185.4 lb

## 2024-07-13 DIAGNOSIS — R200 Anesthesia of skin: Secondary | ICD-10-CM

## 2024-07-13 DIAGNOSIS — R202 Paresthesia of skin: Secondary | ICD-10-CM

## 2024-07-13 DIAGNOSIS — F411 Generalized anxiety disorder: Secondary | ICD-10-CM

## 2024-07-13 DIAGNOSIS — R197 Diarrhea, unspecified: Secondary | ICD-10-CM

## 2024-07-13 DIAGNOSIS — R109 Unspecified abdominal pain: Secondary | ICD-10-CM

## 2024-07-13 LAB — POCT URINALYSIS DIPSTICK
Bilirubin, UA: NEGATIVE
Blood, UA: NEGATIVE
Glucose, UA: NEGATIVE
Ketones, UA: NEGATIVE
Leukocytes, UA: NEGATIVE
Nitrite, UA: NEGATIVE
Protein, UA: NEGATIVE
Spec Grav, UA: <=1.005 — AB (ref 1.010–1.025)
Urobilinogen, UA: 0.2 U/dL
pH, UA: 6 (ref 5.0–8.0)

## 2024-07-13 MED ORDER — MELOXICAM 7.5 MG PO TABS: 7.5000 mg | ORAL_TABLET | Freq: Every day | ORAL | 0 refills | Status: DC

## 2024-07-13 NOTE — Patient Instructions (Addendum)
 Arm symptoms could be from a pinched nerve in the neck or the side of the neck.  I do not appreciate any weakness today, and this would not likely be shingles, without pain along the rash.  Can try gentle range of motion, stretches, warm washcloth on the neck to help with any possible muscle spasm that could be contributing.  I have also prescribed meloxicam  once per day.  Do not combine with any ibuprofen  or Aleve over-the-counter, Tylenol  is okay if needed.  Watch for any blood in the stool or new abdominal pain but that would be very unlikely as we discussed.  If tingling in the arm is not improving in the next week, let me know and we can look at x-ray of the neck or other studies and change in plan.  Keep me posted.  For the anxiety and stress, okay to use alprazolam  if needed for breakthrough symptoms but it may be worthwhile to consider a higher dose of fluoxetine  for now.  You can take 2 of your 10 mg pills each day let me know if you do make that change and I can send in some more refills.   I am encouraged that the abdominal cramping has improved today and only on the 1 side.  Without pain on exam today and improving symptoms I think it is okay to continue to monitor for now.  Imodium or Pepto-Bismol over-the-counter as needed and be seen if not continuing to improve.  Return to the clinic or go to the nearest emergency room if any of your symptoms worsen or new symptoms occur.

## 2024-07-13 NOTE — Progress Notes (Signed)
 Subjective:  Patient ID: [name], female    DOB: 10-Nov-1971  Age: 53 y.o. MRN: [id number]  CC:  Chief Complaint  Patient presents with   Numbness    Pt went to a cupping session and after developed Tingling burning and numbness in her Lt hand wrist and up the arm.    Hypertension    Thinks her anxiety over the numbness has increased her BP    Abdominal Pain    Notes some intermittent cramping in low abdomen     HPI [name] presents for  Multiple concerns above.  Numbness and tingling of arm Started after acupuncture visit with cupping of back on Tuesday 7/15. No initial pain.  Did have some soreness in upper back/neck area at time of treatment with some knots in muscle area at treatment, tight neck but no pain. Noticed tingling in left arm 3-4 days later. Woke up with tingling in arm. Tingling with burning sensation in left arm - not pain, just tingling. Small bumps on arm past 2 weeks. Prior derm eval few months ago for heat rash. Current rash feels different - itchy. No blisters.  Tingling comes and goes. Into thumb and index finger on left only. Some tightness in neck at times, but no neck pain.  She is concerned that her blood pressure is increased due to anxiety over the numbness symptoms in her arm. Feels like blood pressure spikes at night, heart feels like racing, flushc feeling, rush feeling to head, increased anxiety lately. Better with walking and controlled breathing.  Prozac  10mg  every day - has increased xanax  with increased stress. Past few weeks has taken a few.  Defers higher dose of prozac  for now.   No arm weakness, no focal weakness, no new HA.  No prior neck issues, surgery. Tx: ibuprofen , heating pad - some relief.   BP Readings from Last 3 Encounters:  07/13/24 132/74  05/01/24 124/66  10/25/23 122/70    Abdominal pain Lower abd pain started yesterday morning when feeling anxious. Lower abd cramps, then diarrhea. Diarrhea 3 episodes. No  n/v/fever. No blood in diarrhea.  Cramping on left has remained. Other areas better today.  Tx: none. Ginger tea.     History Patient Active Problem List   Diagnosis Date Noted   Keloid 05/01/2024   Post-inflammatory hyperpigmentation 05/01/2024   Uterine fibroid 10/03/2019   Status post total hysterectomy 10/03/2019   Breast cancer screening 06/06/2019   Visit for preventive health examination 01/21/2018   Skin cysts, generalized 01/21/2018   Vitamin D  deficiency 12/23/2016   Vitamin B12 deficiency 12/23/2016   Fibrocystic breast disease 12/23/2016   Seasonal allergic rhinitis 06/05/2013   Mitral regurgitation 06/02/2011   Hyperlipidemia 11/14/2008   Hypertension 07/13/2008   GILBERT'S SYNDROME 06/22/2007   Anxiety state 06/22/2007   PVC (premature ventricular contraction) 06/22/2007   Past Medical History:  Diagnosis Date   Anxiety    Gilbert syndrome    Heart murmur    Dr. Beverli history of heart murmur   Herpes exposure    Type 1 & 2 ; Dr Rutherford, Gyn   Hyperlipidemia    Hypertension    Iron deficiency anemia    Pre-diabetes    Premature ventricular contractions    Skin rash    waist area comes and goes   Vitamin B 12 deficiency    Vitamin D  deficiency    History of, medication stopped because Vitamin d  became too hogh   Past Surgical History:  Procedure Laterality Date   ABDOMINAL HYSTERECTOMY  09/2019   CESAREAN SECTION     X 2, Dr Rutherford   IR RADIOLOGIST EVAL & MGMT  08/29/2019   ROBOTIC ASSISTED LAPAROSCOPIC HYSTERECTOMY AND SALPINGECTOMY Bilateral 10/03/2019   Procedure: XI ROBOTIC ASSISTED LAPAROSCOPIC HYSTERECTOMY AND SALPINGECTOMY;  Surgeon: Rutherford Gain, MD;  Location: [facility] [place];  Service: Gynecology;  Laterality: Bilateral;   TOTAL ABDOMINAL HYSTERECTOMY     TUBAL LIGATION     WISDOM TOOTH EXTRACTION     Allergies  Allergen Reactions   Cephalexin      03/2012 Dyspnea   Other     Grapefruit due to Fluoxetine     Penicillins     Unknown reaction as child   Prior to Admission medications   Medication Sig Start Date End Date Taking? Authorizing Provider  ALPRAZolam  (XANAX ) 0.25 MG tablet Take 1 tablet (0.25 mg total) by mouth 2 (two) times daily as needed for anxiety. 05/01/24  Yes Levora Reyes SAUNDERS, MD  Cholecalciferol (VITAMIN D3) 10000 units TABS Take 1 tablet by mouth daily. 12/23/16  Yes Burns, Glade PARAS, MD  clobetasol  ointment (TEMOVATE ) 0.05 % APPLY TOPICALLY TO THE AFFECTED AREA TWICE DAILY 11/24/21  Yes Sheffield, Kelli R, PA-C  clotrimazole -betamethasone  (LOTRISONE ) cream Apply 1 application topically daily as needed (rash).   Yes [provider]  FLUoxetine  (PROZAC ) 10 MG tablet TAKE 1 TABLET BY MOUTH DAILY 05/01/24  Yes Levora Reyes SAUNDERS, MD  fluticasone  (FLONASE ) 50 MCG/ACT nasal spray SHAKE LIQUID AND USE 2 SPRAYS IN EACH NOSTRIL DAILY 12/29/23  Yes Levora Reyes SAUNDERS, MD  metoprolol  succinate (TOPROL -XL) 25 MG 24 hr tablet TAKE 1 TABLET(25 MG) BY MOUTH DAILY 05/01/24  Yes Levora Reyes SAUNDERS, MD  Multiple Vitamin (MULTIVITAMIN) tablet Take 1 tablet by mouth daily.   Yes [provider]  triamcinolone  ointment (KENALOG ) 0.5 % Apply topically 2 (two) times daily as needed. 05/01/24  Yes Levora Reyes SAUNDERS, MD  ammonium lactate (AMLACTIN DAILY) 12 % lotion 1 application Externally every day (qd) for 30 days Patient not taking: Reported on 07/13/2024 11/06/19   [provider]   Social History   Socioeconomic History   Marital status: Married    Spouse name: Not on file   Number of children: Not on file   Years of education: Not on file   Highest education level: Not on file  Occupational History   Not on file  Tobacco Use   Smoking status: Never   Smokeless tobacco: Never  Vaping Use   Vaping status: Never Used  Substance and Sexual Activity   Alcohol use: No   Drug use: No   Sexual activity: Yes    Birth control/protection: Surgical  Other Topics Concern   Not on  file  Social History Narrative   Not on file   Social Drivers of Health   Financial Resource Strain: Not on file  Food Insecurity: Not on file  Transportation Needs: Not on file  Physical Activity: Not on file  Stress: Not on file  Social Connections: Not on file  Intimate Partner Violence: Not on file    Review of Systems   Objective:   Vitals:   07/13/24 1402  BP: 132/74  Pulse: 75  Temp: 98.3 F (36.8 C)  TempSrc: Temporal  SpO2: 99%  Weight: 185 lb 6.4 oz (84.1 kg)  Height: 5' 7.75 (1.721 m)     Physical Exam Vitals reviewed.  Constitutional:      Appearance: Normal appearance.  She is well-developed.  HENT:     Head: Normocephalic and atraumatic.  Eyes:     Conjunctiva/sclera: Conjunctivae normal.     Pupils: Pupils are equal, round, and reactive to light.  Neck:     Vascular: No carotid bruit.  Cardiovascular:     Rate and Rhythm: Normal rate and regular rhythm.     Heart sounds: Normal heart sounds.  Pulmonary:     Effort: Pulmonary effort is normal.     Breath sounds: Normal breath sounds.  Abdominal:     General: There is no distension.     Palpations: Abdomen is soft. There is no pulsatile mass.     Tenderness: There is no abdominal tenderness.     Comments: Hyperactive bowel sounds, nontender.  No rebound or guarding.  Musculoskeletal:     Right lower leg: No edema.     Left lower leg: No edema.     Comments: C-spine, no midline bony tenderness.  Minimal spasm paraspinals but no focal tenderness.  Minimal decreased extension but otherwise equal range of motion with rotation, lateral flexion, overall intact.  No change in arm symptoms with exam.  Does have some reproduction of symptoms with Tinel's over brachial plexus on the left. And equal strength on left arm, left grip compared to right side.  No focal weakness. Few patches of faint papules on arm but nontender, no vesicles.  See photo.  Skin:    General: Skin is warm and dry.   Neurological:     Mental Status: She is alert and oriented to person, place, and time.  Psychiatric:        Mood and Affect: Mood normal.        Behavior: Behavior normal.          Assessment & Plan:  [name] is a 53 y.o. female . Numbness and tingling in left arm - Plan: meloxicam  (MOBIC ) 7.5 MG tablet  - Possible cervical spine source versus brachial plexus, no known injury.  Possible spasm, inflammation with intermittent symptoms.  No weakness.  Reassuring exam.  Slight reproduction of symptoms with Tinel's over brachial plexus, plexopathy possible but again no weakness and intermittent symptoms.  Few small patches of rash but do not look vesicular, those preceded her neck symptoms, and no pain along skin of arm.  Unlikely shingles.  - Trial of meloxicam  short-term, avoid muscle relaxants for now.  Potential side effects and risk discussed including with combination of SSRI, gastrointestinal bleed risk but unlikely.  - Update on symptoms over the next week, with option of cervical spine imaging, neuro or Ortho eval if not improving.  Abdominal cramping - Plan: POCT Urinalysis Dipstick Diarrhea, unspecified type  - Noted since yesterday with improving cramps, left-sided today, nontender on exam.  Question foodborne illness versus viral illness versus component of anxiety.  Given reassuring exam and improving symptoms we will continue to watch for now with over-the-counter treatment with Pepto bismuth or Imodium if needed.  RTC precautions given.    Anxiety state  - Flare of anxiety due to concerns of her arm symptoms above.  Reassurance was provided, option of alprazolam  for breakthrough symptoms which she has at home, option to increase fluoxetine  if persistent anxiety and RTC precautions given.   Meds ordered this encounter  Medications   meloxicam  (MOBIC ) 7.5 MG tablet    Sig: Take 1 tablet (7.5 mg total) by mouth daily.    Dispense:  30 tablet    Refill:  0  Patient  Instructions  Arm symptoms could be from a pinched nerve in the neck or the side of the neck.  I do not appreciate any weakness today, and this would not likely be shingles, without pain along the rash.  Can try gentle range of motion, stretches, warm washcloth on the neck to help with any possible muscle spasm that could be contributing.  I have also prescribed meloxicam  once per day.  Do not combine with any ibuprofen  or Aleve over-the-counter, Tylenol  is okay if needed.  Watch for any blood in the stool or new abdominal pain but that would be very unlikely as we discussed.  If tingling in the arm is not improving in the next week, let me know and we can look at x-ray of the neck or other studies and change in plan.  Keep me posted.  For the anxiety and stress, okay to use alprazolam  if needed for breakthrough symptoms but it may be worthwhile to consider a higher dose of fluoxetine  for now.  You can take 2 of your 10 mg pills each day let me know if you do make that change and I can send in some more refills.   I am encouraged that the abdominal cramping has improved today and only on the 1 side.  Without pain on exam today and improving symptoms I think it is okay to continue to monitor for now.  Imodium or Pepto-Bismol over-the-counter as needed and be seen if not continuing to improve.  Return to the clinic or go to the nearest emergency room if any of your symptoms worsen or new symptoms occur.           Signed,   Reyes Pines, MD [facility], [facility] 07/13/24 3:13 PM

## 2024-08-10 ENCOUNTER — Other Ambulatory Visit: Payer: Self-pay | Admitting: Family Medicine

## 2024-08-10 DIAGNOSIS — R200 Anesthesia of skin: Secondary | ICD-10-CM

## 2024-09-12 ENCOUNTER — Other Ambulatory Visit: Payer: Self-pay | Admitting: Family Medicine

## 2024-09-12 DIAGNOSIS — R202 Paresthesia of skin: Secondary | ICD-10-CM

## 2024-11-02 ENCOUNTER — Ambulatory Visit: Admitting: Family Medicine

## 2024-11-02 VITALS — BP 122/70 | HR 71 | Temp 98.2°F | Resp 14 | Ht 67.75 in | Wt 188.2 lb

## 2024-11-02 DIAGNOSIS — J029 Acute pharyngitis, unspecified: Secondary | ICD-10-CM | POA: Diagnosis not present

## 2024-11-02 LAB — POCT RAPID STREP A (OFFICE): Rapid Strep A Screen: NEGATIVE

## 2024-11-02 LAB — POC COVID19 BINAXNOW: SARS Coronavirus 2 Ag: NEGATIVE

## 2024-11-02 NOTE — Patient Instructions (Signed)
 Based on your exam today I suspect you have a virus causing the sore throat.  I will let you know if the COVID or strep test are positive and if the strep test is negative we did send off a strep throat culture as well.  Again I expect these to be negative or normal.  Since this is likely a virus, continued symptomatic care is recommended.  Drink plenty of fluids, cold or warm fluids, whichever feels best for your throat.  Sore throat lozenges like Cepacol may also be helpful as well as a teaspoon of honey at bedtime may help sore throat.  See other information below.  If any new or worsening symptoms let me know but I expect you to be improving as the week goes on.  Take care!  Sore Throat A sore throat is pain, burning, irritation, or scratchiness in the throat. When you have a sore throat, you may feel pain or tenderness in your throat when you swallow or talk. Many things can cause a sore throat, including: An infection. Seasonal allergies. Dryness in the air. Irritants, such as smoke or pollution. Radiation treatment for cancer. Gastroesophageal reflux disease (GERD). A tumor. A sore throat is often the first sign of another sickness. It may happen with other symptoms, such as coughing, sneezing, fever, and swollen neck glands. Most sore throats go away without medical treatment. Follow these instructions at home:     Medicines Take over-the-counter and prescription medicines only as told by your health care provider. Children often get sore throats. Do not give your child aspirin because of the association with Reye's syndrome. Use throat sprays to soothe your throat as told by your health care provider. Managing pain To help with pain, try: Sipping warm liquids, such as broth, herbal tea, or warm water. Eating or drinking cold or frozen liquids, such as frozen ice pops. Gargling with a mixture of salt and water 3-4 times a day or as needed. To make salt water, completely dissolve -1  tsp (3-6 g) of salt in 1 cup (237 mL) of warm water. Sucking on hard candy or throat lozenges. Putting a cool-mist humidifier in your bedroom at night to moisten the air. Sitting in the bathroom with the door closed for 5-10 minutes while you run hot water in the shower. General instructions Do not use any products that contain nicotine or tobacco. These products include cigarettes, chewing tobacco, and vaping devices, such as e-cigarettes. If you need help quitting, ask your health care provider. Rest as needed. Drink enough fluid to keep your urine pale yellow. Wash your hands often with soap and water for at least 20 seconds. If soap and water are not available, use hand sanitizer. Contact a health care provider if: You have a fever for more than 2-3 days. You have symptoms that last for more than 2-3 days. Your throat does not get better within 7 days. You have a fever and your symptoms suddenly get worse. Get help right away if: You have difficulty breathing. You cannot swallow fluids, soft foods, or your saliva. You have increased swelling in your throat or neck. You have persistent nausea and vomiting. These symptoms may represent a serious problem that is an emergency. Do not wait to see if the symptoms will go away. Get medical help right away. Call your local emergency services (911 in the [place] Do not drive yourself to the hospital. Summary A sore throat is pain, burning, irritation, or scratchiness in the throat. Many  things can cause a sore throat. Take over-the-counter medicines only as told by your health care provider. Rest as needed. Drink enough fluid to keep your urine pale yellow. Contact a health care provider if your throat does not get better within 7 days. This information is not intended to replace advice given to you by your health care provider. Make sure you discuss any questions you have with your health care provider. Document Revised: 03/05/2021 Document  Reviewed: 03/05/2021 Elsevier Patient Education  2024 [employer].

## 2024-11-02 NOTE — Progress Notes (Signed)
 Subjective:  Patient ID: [name], female    DOB: Jan 19, 1971  Age: 53 y.o. MRN: [id number]  CC:  Chief Complaint  Patient presents with   Sore Throat    Sore throat for 4 days and notes no fever or other sxs, no known exposure to strep    HPI [name] presents for   Sore throat: Started 4 days ago. 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 some singing day prior, no apparent voice strain or issues until next day. Woke up with sore throat, no cough, no fever. No HA/bodyache.  Worse at night. Minimal PND.   No sick contacts or exposure to strep.  No home testing.   Tx:ibuprofen  last night, hot tea.  Luden's cough drops.   History Patient Active Problem List   Diagnosis Date Noted   Keloid 05/01/2024   Post-inflammatory hyperpigmentation 05/01/2024   Uterine fibroid 10/03/2019   Status post total hysterectomy 10/03/2019   Breast cancer screening 06/06/2019   Visit for preventive health examination 01/21/2018   Skin cysts, generalized 01/21/2018   Vitamin D  deficiency 12/23/2016   Vitamin B12 deficiency 12/23/2016   Fibrocystic breast disease 12/23/2016   Seasonal allergic rhinitis 06/05/2013   Mitral regurgitation 06/02/2011   Hyperlipidemia 11/14/2008   Hypertension 07/13/2008   GILBERT'S SYNDROME 06/22/2007   Anxiety state 06/22/2007   PVC (premature ventricular contraction) 06/22/2007   Past Medical History:  Diagnosis Date   Anxiety    Gilbert syndrome    Heart murmur    Dr. Beverli history of heart murmur   Herpes exposure    Type 1 & 2 ; Dr Rutherford, Gyn   Hyperlipidemia    Hypertension    Iron deficiency anemia    Pre-diabetes    Premature ventricular contractions    Skin rash    waist area comes and goes   Vitamin B 12 deficiency    Vitamin D  deficiency    History of, medication stopped because Vitamin d  became too hogh   Past Surgical History:  Procedure Laterality Date   ABDOMINAL HYSTERECTOMY  09/2019   CESAREAN SECTION     X 2, Dr Rutherford    IR RADIOLOGIST EVAL & MGMT  08/29/2019   ROBOTIC ASSISTED LAPAROSCOPIC HYSTERECTOMY AND SALPINGECTOMY Bilateral 10/03/2019   Procedure: XI ROBOTIC ASSISTED LAPAROSCOPIC HYSTERECTOMY AND SALPINGECTOMY;  Surgeon: Rutherford Gain, MD;  Location: [facility] [place];  Service: Gynecology;  Laterality: Bilateral;   TOTAL ABDOMINAL HYSTERECTOMY     TUBAL LIGATION     WISDOM TOOTH EXTRACTION     Allergies  Allergen Reactions   Cephalexin      03/2012 Dyspnea   Other     Grapefruit due to Fluoxetine    Penicillins     Unknown reaction as child   Prior to Admission medications   Medication Sig Start Date End Date Taking? Authorizing Provider  ALPRAZolam  (XANAX ) 0.25 MG tablet Take 1 tablet (0.25 mg total) by mouth 2 (two) times daily as needed for anxiety. 05/01/24   Levora Reyes SAUNDERS, MD  ammonium lactate (AMLACTIN DAILY) 12 % lotion 1 application Externally every day (qd) for 30 days Patient not taking: Reported on 07/13/2024 11/06/19   [provider]  Cholecalciferol (VITAMIN D3) 10000 units TABS Take 1 tablet by mouth daily. 12/23/16   Geofm Glade PARAS, MD  clobetasol  ointment (TEMOVATE ) 0.05 % APPLY TOPICALLY TO THE AFFECTED AREA TWICE DAILY 11/24/21   Sheffield, Kelli R, PA-C  clotrimazole -betamethasone  (LOTRISONE ) cream Apply 1 application topically daily as  needed (rash).    [provider]  FLUoxetine  (PROZAC ) 10 MG tablet TAKE 1 TABLET BY MOUTH DAILY 05/01/24   Levora Reyes SAUNDERS, MD  fluticasone  (FLONASE ) 50 MCG/ACT nasal spray SHAKE LIQUID AND USE 2 SPRAYS IN EACH NOSTRIL DAILY 12/29/23   Levora Reyes SAUNDERS, MD  meloxicam  (MOBIC ) 7.5 MG tablet TAKE 1 TABLET(7.5 MG) BY MOUTH DAILY 08/10/24   Levora Reyes SAUNDERS, MD  metoprolol  succinate (TOPROL -XL) 25 MG 24 hr tablet TAKE 1 TABLET(25 MG) BY MOUTH DAILY 05/01/24   Levora Reyes SAUNDERS, MD  Multiple Vitamin (MULTIVITAMIN) tablet Take 1 tablet by mouth daily.    [provider]  triamcinolone  ointment (KENALOG ) 0.5 %  Apply topically 2 (two) times daily as needed. 05/01/24   Levora Reyes SAUNDERS, MD   Social History   Socioeconomic History   Marital status: Married    Spouse name: Not on file   Number of children: Not on file   Years of education: Not on file   Highest education level: Not on file  Occupational History   Not on file  Tobacco Use   Smoking status: Never   Smokeless tobacco: Never  Vaping Use   Vaping status: Never Used  Substance and Sexual Activity   Alcohol use: No   Drug use: No   Sexual activity: Yes    Birth control/protection: Surgical  Other Topics Concern   Not on file  Social History Narrative   Not on file   Social Drivers of Health   Financial Resource Strain: Not on file  Food Insecurity: Not on file  Transportation Needs: Not on file  Physical Activity: Not on file  Stress: Not on file  Social Connections: Not on file  Intimate Partner Violence: Not on file    Review of Systems Per HPI.   Objective:   Vitals:   11/02/24 0916  BP: 122/70  Pulse: 71  Resp: 14  Temp: 98.2 F (36.8 C)  TempSrc: Temporal  SpO2: 100%  Weight: 188 lb 3.2 oz (85.4 kg)  Height: 5' 7.75 (1.721 m)     Physical Exam Vitals reviewed.  Constitutional:      General: She is not in acute distress.    Appearance: She is well-developed.  HENT:     Head: Normocephalic and atraumatic.     Right Ear: Hearing, tympanic membrane, ear canal and external ear normal.     Left Ear: Hearing, tympanic membrane, ear canal and external ear normal.     Nose: Nose normal.     Mouth/Throat:     Mouth: Mucous membranes are moist. No oral lesions.     Pharynx: No oropharyngeal exudate or posterior oropharyngeal erythema.     Tonsils: No tonsillar exudate or tonsillar abscesses.     Comments: Moist mucosa without erythema, clearing secretions without difficulty. Eyes:     Conjunctiva/sclera: Conjunctivae normal.     Pupils: Pupils are equal, round, and reactive to light.  Neck:      Comments: Nontender, no LAD.  Cardiovascular:     Rate and Rhythm: Normal rate and regular rhythm.     Heart sounds: Murmur heard.  Pulmonary:     Effort: Pulmonary effort is normal. No respiratory distress.     Breath sounds: Normal breath sounds. No wheezing or rhonchi.  Lymphadenopathy:     Cervical: No cervical adenopathy.  Skin:    General: Skin is warm and dry.     Findings: No rash.  Neurological:     Mental  Status: She is alert and oriented to person, place, and time.  Psychiatric:        Mood and Affect: Mood normal.        Behavior: Behavior normal.     Assessment & Plan:  KATHEREN JIMMERSON is a 53 y.o. female . Sore throat - Plan: POCT rapid strep A, POC COVID-19 BinaxNow, Culture, Group A Strep Suspected viral illness, reassuring exam.  Check labs above, will contact patient with results.  Symptomatic care discussed with fluids, antipyretics if needed, sore throat lozenges, rest with RTC precautions given.  No orders of the defined types were placed in this encounter.  Patient Instructions  Based on your exam today I suspect you have a virus causing the sore throat.  I will let you know if the COVID or strep test are positive and if the strep test is negative we did send off a strep throat culture as well.  Again I expect these to be negative or normal.  Since this is likely a virus, continued symptomatic care is recommended.  Drink plenty of fluids, cold or warm fluids, whichever feels best for your throat.  Sore throat lozenges like Cepacol may also be helpful as well as a teaspoon of honey at bedtime may help sore throat.  See other information below.  If any new or worsening symptoms let me know but I expect you to be improving as the week goes on.  Take care!  Sore Throat A sore throat is pain, burning, irritation, or scratchiness in the throat. When you have a sore throat, you may feel pain or tenderness in your throat when you swallow or talk. Many things can cause a  sore throat, including: An infection. Seasonal allergies. Dryness in the air. Irritants, such as smoke or pollution. Radiation treatment for cancer. Gastroesophageal reflux disease (GERD). A tumor. A sore throat is often the first sign of another sickness. It may happen with other symptoms, such as coughing, sneezing, fever, and swollen neck glands. Most sore throats go away without medical treatment. Follow these instructions at home:     Medicines Take over-the-counter and prescription medicines only as told by your health care provider. Children often get sore throats. Do not give your child aspirin because of the association with Reye's syndrome. Use throat sprays to soothe your throat as told by your health care provider. Managing pain To help with pain, try: Sipping warm liquids, such as broth, herbal tea, or warm water. Eating or drinking cold or frozen liquids, such as frozen ice pops. Gargling with a mixture of salt and water 3-4 times a day or as needed. To make salt water, completely dissolve -1 tsp (3-6 g) of salt in 1 cup (237 mL) of warm water. Sucking on hard candy or throat lozenges. Putting a cool-mist humidifier in your bedroom at night to moisten the air. Sitting in the bathroom with the door closed for 5-10 minutes while you run hot water in the shower. General instructions Do not use any products that contain nicotine or tobacco. These products include cigarettes, chewing tobacco, and vaping devices, such as e-cigarettes. If you need help quitting, ask your health care provider. Rest as needed. Drink enough fluid to keep your urine pale yellow. Wash your hands often with soap and water for at least 20 seconds. If soap and water are not available, use hand sanitizer. Contact a health care provider if: You have a fever for more than 2-3 days. You have symptoms that last  for more than 2-3 days. Your throat does not get better within 7 days. You have a fever and  your symptoms suddenly get worse. Get help right away if: You have difficulty breathing. You cannot swallow fluids, soft foods, or your saliva. You have increased swelling in your throat or neck. You have persistent nausea and vomiting. These symptoms may represent a serious problem that is an emergency. Do not wait to see if the symptoms will go away. Get medical help right away. Call your local emergency services (911 in the [place] Do not drive yourself to the hospital. Summary A sore throat is pain, burning, irritation, or scratchiness in the throat. Many things can cause a sore throat. Take over-the-counter medicines only as told by your health care provider. Rest as needed. Drink enough fluid to keep your urine pale yellow. Contact a health care provider if your throat does not get better within 7 days. This information is not intended to replace advice given to you by your health care provider. Make sure you discuss any questions you have with your health care provider. Document Revised: 03/05/2021 Document Reviewed: 03/05/2021 Elsevier Patient Education  2024 [employer]    Signed,   Reyes Pines, MD [facility], [facility] 11/02/24 9:52 AM

## 2024-11-03 ENCOUNTER — Ambulatory Visit: Payer: Self-pay | Admitting: Family Medicine

## 2024-11-04 ENCOUNTER — Encounter: Payer: Self-pay | Admitting: Family Medicine

## 2024-11-04 LAB — CULTURE, GROUP A STREP
Micro Number: 17231328
SPECIMEN QUALITY:: ADEQUATE

## 2025-05-02 ENCOUNTER — Encounter: Admitting: Family Medicine
